# Patient Record
Sex: Male | Born: 1980 | Race: White | Hispanic: No | Marital: Single | Smoking: Former smoker
Health system: Southern US, Community
[De-identification: ages and names within clinical notes are randomized; demographics above are authoritative.]

## PROBLEM LIST (undated history)

## (undated) VITALS — BP 116/78 | HR 112 | Temp 97.3°F | Resp 16 | Ht 70.0 in | Wt 197.0 lb

## (undated) DIAGNOSIS — F32A Depression, unspecified: Secondary | ICD-10-CM

## (undated) DIAGNOSIS — F419 Anxiety disorder, unspecified: Secondary | ICD-10-CM

## (undated) DIAGNOSIS — F329 Major depressive disorder, single episode, unspecified: Secondary | ICD-10-CM

## (undated) DIAGNOSIS — F1020 Alcohol dependence, uncomplicated: Secondary | ICD-10-CM

## (undated) DIAGNOSIS — I10 Essential (primary) hypertension: Secondary | ICD-10-CM

## (undated) DIAGNOSIS — K859 Acute pancreatitis without necrosis or infection, unspecified: Secondary | ICD-10-CM

## (undated) DIAGNOSIS — J45909 Unspecified asthma, uncomplicated: Secondary | ICD-10-CM

## (undated) HISTORY — PX: NO PAST SURGERIES: SHX2092

## (undated) HISTORY — DX: Alcohol dependence, uncomplicated: F10.20

---
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
## 2005-04-11 ENCOUNTER — Other Ambulatory Visit (HOSPITAL_COMMUNITY): Admission: RE | Admit: 2005-04-11 | Discharge: 2005-04-27 | Payer: Self-pay | Admitting: Psychiatry

## 2005-04-19 ENCOUNTER — Ambulatory Visit: Payer: Self-pay | Admitting: Psychiatry

## 2006-01-22 ENCOUNTER — Ambulatory Visit: Payer: Self-pay | Admitting: Psychology

## 2006-09-21 ENCOUNTER — Emergency Department (HOSPITAL_COMMUNITY): Admission: EM | Admit: 2006-09-21 | Discharge: 2006-09-22 | Payer: Self-pay | Admitting: Emergency Medicine

## 2007-12-20 ENCOUNTER — Emergency Department (HOSPITAL_COMMUNITY): Admission: EM | Admit: 2007-12-20 | Discharge: 2007-12-20 | Payer: Self-pay | Admitting: Emergency Medicine

## 2008-07-31 ENCOUNTER — Ambulatory Visit: Payer: Self-pay | Admitting: Internal Medicine

## 2008-07-31 DIAGNOSIS — H6120 Impacted cerumen, unspecified ear: Secondary | ICD-10-CM | POA: Insufficient documentation

## 2008-07-31 DIAGNOSIS — J45909 Unspecified asthma, uncomplicated: Secondary | ICD-10-CM | POA: Insufficient documentation

## 2008-07-31 DIAGNOSIS — F411 Generalized anxiety disorder: Secondary | ICD-10-CM | POA: Insufficient documentation

## 2008-09-11 ENCOUNTER — Ambulatory Visit: Payer: Self-pay | Admitting: Internal Medicine

## 2009-01-14 ENCOUNTER — Ambulatory Visit: Payer: Self-pay | Admitting: Internal Medicine

## 2009-01-14 ENCOUNTER — Encounter: Payer: Self-pay | Admitting: Adult Health

## 2009-01-19 DIAGNOSIS — R079 Chest pain, unspecified: Secondary | ICD-10-CM | POA: Insufficient documentation

## 2009-03-05 ENCOUNTER — Ambulatory Visit: Payer: Self-pay | Admitting: Internal Medicine

## 2009-03-05 ENCOUNTER — Encounter: Payer: Self-pay | Admitting: Adult Health

## 2009-03-05 ENCOUNTER — Telehealth (INDEPENDENT_AMBULATORY_CARE_PROVIDER_SITE_OTHER): Payer: Self-pay | Admitting: *Deleted

## 2009-03-08 ENCOUNTER — Telehealth (INDEPENDENT_AMBULATORY_CARE_PROVIDER_SITE_OTHER): Payer: Self-pay | Admitting: *Deleted

## 2009-03-08 LAB — CONVERTED CEMR LAB
AST: 46 units/L — ABNORMAL HIGH (ref 0–37)
Alkaline Phosphatase: 78 units/L (ref 39–117)
BUN: 9 mg/dL (ref 6–23)
Basophils Relative: 0.8 % (ref 0.0–3.0)
CO2: 28 meq/L (ref 19–32)
Cholesterol: 191 mg/dL (ref 0–200)
Creatinine, Ser: 0.8 mg/dL (ref 0.4–1.5)
Eosinophils Absolute: 0.2 10*3/uL (ref 0.0–0.7)
Eosinophils Relative: 3 % (ref 0.0–5.0)
Glucose, Bld: 82 mg/dL (ref 70–99)
HCT: 41.6 % (ref 39.0–52.0)
Lymphocytes Relative: 23.2 % (ref 12.0–46.0)
Monocytes Absolute: 0.7 10*3/uL (ref 0.1–1.0)
Neutro Abs: 4.5 10*3/uL (ref 1.4–7.7)
Platelets: 222 10*3/uL (ref 150.0–400.0)
Potassium: 3.5 meq/L (ref 3.5–5.1)
RDW: 12.2 % (ref 11.5–14.6)
Sodium: 138 meq/L (ref 135–145)
TSH: 1.28 microintl units/mL (ref 0.35–5.50)
Total Bilirubin: 1.2 mg/dL (ref 0.3–1.2)
VLDL: 9.2 mg/dL (ref 0.0–40.0)

## 2009-08-05 ENCOUNTER — Telehealth (INDEPENDENT_AMBULATORY_CARE_PROVIDER_SITE_OTHER): Payer: Self-pay | Admitting: *Deleted

## 2009-08-05 ENCOUNTER — Encounter: Payer: Self-pay | Admitting: Adult Health

## 2009-08-05 ENCOUNTER — Ambulatory Visit: Payer: Self-pay | Admitting: Internal Medicine

## 2009-08-05 DIAGNOSIS — R109 Unspecified abdominal pain: Secondary | ICD-10-CM | POA: Insufficient documentation

## 2009-08-05 LAB — CONVERTED CEMR LAB
Basophils Relative: 0.7 % (ref 0.0–3.0)
Bilirubin, Direct: 0.5 mg/dL — ABNORMAL HIGH (ref 0.0–0.3)
CO2: 25 meq/L (ref 19–32)
Calcium: 8.9 mg/dL (ref 8.4–10.5)
Chloride: 95 meq/L — ABNORMAL LOW (ref 96–112)
Eosinophils Absolute: 0.3 10*3/uL (ref 0.0–0.7)
Eosinophils Relative: 3.3 % (ref 0.0–5.0)
GFR calc non Af Amer: 113.89 mL/min (ref >60)
Hemoglobin: 15.5 g/dL (ref 13.0–17.0)
Leukocytes, UA: NEGATIVE
Lipase: 47 units/L (ref 11.0–59.0)
Lymphocytes Relative: 20.6 % (ref 12.0–46.0)
MCHC: 35.9 g/dL (ref 30.0–36.0)
MCV: 95.3 fL (ref 78.0–100.0)
Monocytes Absolute: 0.9 10*3/uL (ref 0.1–1.0)
Neutrophils Relative %: 63.2 % (ref 43.0–77.0)
Nitrite: NEGATIVE
Platelets: 178 10*3/uL (ref 150.0–400.0)
Potassium: 3.5 meq/L (ref 3.5–5.1)
Sed Rate: 6 mm/hr (ref 0–22)
Specific Gravity, Urine: >=1.03 (ref 1.000–1.030)
Total Bilirubin: 2.5 mg/dL — ABNORMAL HIGH (ref 0.3–1.2)
Total Protein, Urine: 30 mg/dL
Total Protein: 7.8 g/dL (ref 6.0–8.3)
Urobilinogen, UA: 1 (ref 0.0–1.0)
pH: 5.5 (ref 5.0–8.0)

## 2009-08-06 ENCOUNTER — Encounter: Admission: RE | Admit: 2009-08-06 | Discharge: 2009-08-06 | Payer: Self-pay | Admitting: Pulmonary Disease

## 2009-08-19 ENCOUNTER — Telehealth: Payer: Self-pay | Admitting: Adult Health

## 2009-08-19 ENCOUNTER — Ambulatory Visit (HOSPITAL_COMMUNITY): Admission: RE | Admit: 2009-08-19 | Discharge: 2009-08-19 | Payer: Self-pay | Admitting: Psychiatry

## 2009-08-23 ENCOUNTER — Other Ambulatory Visit (HOSPITAL_COMMUNITY): Admission: RE | Admit: 2009-08-23 | Discharge: 2009-09-02 | Payer: Self-pay | Admitting: Psychiatry

## 2009-09-01 ENCOUNTER — Emergency Department (HOSPITAL_COMMUNITY): Admission: EM | Admit: 2009-09-01 | Discharge: 2009-09-02 | Payer: Self-pay | Admitting: Emergency Medicine

## 2009-12-14 ENCOUNTER — Emergency Department (HOSPITAL_COMMUNITY)
Admission: EM | Admit: 2009-12-14 | Discharge: 2009-12-15 | Payer: Self-pay | Source: Home / Self Care | Admitting: Emergency Medicine

## 2010-02-08 NOTE — Progress Notes (Signed)
Summary: labs  Phone Note Call from Patient Call back at [phone]   Caller: Patient Call For: tammy Summary of Call: calling about l;abs and form to be faxed to ins company Initial call taken by: Lacinda Axon,  March 08, 2009 12:21 PM  Follow-up for Phone Call        form faxed this morning to number provided on form.  LMOVM to inform pt form was faxed and to call back to receive lab results. Boone Master CNA  March 08, 2009 1:00 PM   Additional Follow-up for Phone Call Additional follow up Details #1::        pt called back to talk  with Boone Master but she was in a room with a pt---i spoke with pt and reviewed his labs with him per TP---labs look good---liver test slightly elevated---rec return in 3 months to recheck. pt voiced his understanding and he is also aware that the form has been faxed back to his inusrance company Randell Loop CMA  March 08, 2009 3:31 PM

## 2010-02-08 NOTE — Progress Notes (Signed)
Summary: Pschy Referral   ---- Converted from flag ---- ---- 08/17/2009 3:06 PM, Alfonso Ramus wrote: Sharyn Dross: I called Christiane Ha and gave him Dr. Whitman Hero phone number on 08/05/09. Then touched base with him every other day x 1 week to see if he had scheduled appt. He would tell me that he left message and was waiting on her to call him. Dr. Whitman Hero just called and stated that she and [name] played phone tag for several days. That he would always call after business hours. She stated no appt has been scheduled for him. She said that 1) thank you for the referral and for thinking of her 2) Stated that she has tried to schedule pt but he just will not call her back during office hours to schedule appt.  Thanks, Rhonda ------------------------------ Pt called informed will need ov set up.  Phone Note From Other Clinic   Summary of Call:  called [name] and gave him Dr. Whitman Hero phone number on 08/05/09. Then touched base with him every other day x 1 week to see if he had scheduled appt. He would tell me that he left message and was waiting on her to call him. Dr. Whitman Hero just called and stated that she and [name] played phone tag for several days. That he would always call after business hours. She stated no appt has been scheduled for him. She said that 1) thank you for the referral and for thinking of her 2) Stated that she has tried to schedule pt but he just will not call her back during office hours to schedule appt.  Thanks, Rhonda ------------------------------ Pt called informed will need ov set up.

## 2010-02-08 NOTE — Assessment & Plan Note (Signed)
Summary: Acute NP office visit - CP   CC:  left-sided chest pain that radiates into the left shoulder occasionally x46month - describes as tightness.  denies jaw pain and vision changes..  History of Present Illness: 30 year old male with history of Asthma. 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 Sherene Sires. He wanted to discuss a couple of issure today.  1. Right ear feels like water in it. Went to [facility] recently. NO pain or drainage.  2. He has history of OCD dx at age 89, w/ some anxiety intermittently. Has waxed and waned prev. seen by counselor during childhood, teens. Was treated w/ old drug ?tofranil since age 75 , stopped early this year. Was given hydroxzine. 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  Him and ex-girlfriend have a child age 50 that he has joint custody of. Admits that he drinks to help with anxiety, has cut back on drinking some.  c/o worrying constantly, mind racing, low feelings, tearful, trouble sleeping, wt up and down. If he is busy better but when he is by himself or slow at work, trouble w/ anxiety. Denies chest pain, dyspnea, orthopnea, hemoptysis, fever, n/v/d, edema, headache, suicidal/homicidal ideation.   September 11, 2008--returns for follow up and med refills, Feeling better but still has episodes of anxiety especially when he is not busy. At work he is better. Did stop meds for 1 week, but restarted b/c felt better on meds. no suicidal ideations,Denies chest pain, dyspnea, orthopnea, hemoptysis, fever, n/v/d, edema, headache,recent travel or antibiotic. Does not have insurance but meds were affordable. Unable to go to counseling due to cost. Has looked into county programs in past.   01/14/09--Presents for an acute work in  visit. Complains of left-sided chest pain that radiates into the left shoulder occasionally x14month - describes as tightness.   Seems to happen when he is upset/anxious. Xanax helps/relieves. NO associated dyspnea, diaphrosis, syncope. GERD sx.  No FH of heart disease.    Preventive Screening-Counseling & Management  Alcohol-Tobacco     Smoking Status: never  Medications Prior to Update: 1)  Proair Hfa 108 (90 Base) Mcg/act Aers (Albuterol Sulfate) .... Inhale 2 Puffs Every Four Hours As Needed 2)  Advair Diskus 250-50 Mcg/dose Misc (Fluticasone-Salmeterol) .... Inhale 1 Puff Two Times A Day 3)  Zoloft 100 Mg Tabs (Sertraline Hcl) .Marland Kitchen.. 1 By Mouth Once Daily 4)  Alprazolam 0.25 Mg Tabs (Alprazolam) .... 1/2 -1 By Mouth Two Times A Day As Needed Anxiety  Current Medications (verified): 1)  Proair Hfa 108 (90 Base) Mcg/act Aers (Albuterol Sulfate) .... Inhale 2 Puffs Every Four Hours As Needed 2)  Advair Diskus 250-50 Mcg/dose Misc (Fluticasone-Salmeterol) .... Inhale 1 Puff Two Times A Day 3)  Zoloft 100 Mg Tabs (Sertraline Hcl) .Marland Kitchen.. 1 By Mouth Once Daily 4)  Alprazolam 0.25 Mg Tabs (Alprazolam) .... 1/2 -1 By Mouth Two Times A Day As Needed Anxiety  Allergies (verified): No Known Drug Allergies  Past History:  Family History: Last updated: 01/14/2009 mother - asthma sister - asthma father- asthma neg for CAD   Social History: Last updated: 07/31/2008 never smoked alcohol socially single 1 child lives alone with son work at [employer] and [employer]  Risk Factors: Smoking Status: never (01/14/2009)  Past Medical History: Asthma OCD  Anxiety- started on zoloft 50mg  07/2008, increased 100mg  once daily 09/2008.  Atypical CP 01/14/09 ---EKG nml   Family History: mother - asthma sister - asthma father- asthma neg for CAD   Social History: Smoking Status:  never  Review of Systems      See HPI  Vital Signs:  Patient profile:   30 year old male Height:      71  inches Weight:      204 pounds BMI:     28.56 O2 Sat:      98 % on Room air Temp:     98.6 degrees F oral Pulse rate:   86 / minute BP sitting:   130 / 74  (left arm) Cuff size:   regular  Vitals Entered By: Boone Master CNA (January 14, 2009 5:16 PM)  O2 Flow:  Room air CC: left-sided chest pain that radiates into the left shoulder occasionally x36month - describes as tightness.  denies jaw pain, vision changes. Is Patient Diabetic? No Comments Medications reviewed with patient Daytime contact number verified with patient. Boone Master CNA  January 14, 2009 5:16 PM    Physical Exam  Additional Exam:  GEN: A/Ox3; pleasant , NAD HEENT:  [place]/AT, , EACs-clear, TMs-wnl, NOSE-clear, THROAT-clear NECK:  Supple w/ fair ROM; no JVD; normal carotid impulses w/o bruits; no thyromegaly or nodules palpated; no lymphadenopathy. RESP  Clear to P & A; w/o, wheezes/ rales/ or rhonchi. CARD:  RRR, no m/r/g   GI:   Soft & nt; nml bowel sounds; no organomegaly or masses detected. Musco: Warm bil,  no calf tenderness edema, clubbing, pulses intact Neuro: EOM-wnl, PERRLA, CN 2-12 intact,nml equal grips/streng   EKG nml , no acute changes.    Impression & Recommendations:  Problem # 1:  CHEST PAIN (ICD-786.50)  EKG with no acute changes.  Suspect this is multifactoral w/ undellying anxiety ? muscular in nature.  pt has been instructed to return in 1 month for fasting labs (he will have health insurance at this time) would like to get routine labs.  REC:  stress reducers Warm heat to chest and shoulder area follow up 1 month for routine physical --come fasting.  Please contact office for sooner follow up if symptoms do not improve or worsen  If this worsens or does not improve call back immediately or go to ER.  he is aware if this does not improve to call back or go to ER.   Orders: Est. Patient Level I (29528)  Problem # 2:  ANXIETY STATE, UNSPECIFIED (ICD-300.00) cont on same meds.   cont w/ couseling.  His updated medication list for this problem includes:    Zoloft 100 Mg Tabs (Sertraline hcl) .Marland Kitchen... 1 by mouth once daily    Alprazolam 0.25 Mg Tabs (Alprazolam) .Marland Kitchen... 1/2 -1 by mouth two times a day as needed anxiety  Complete Medication List: 1)  Proair Hfa 108 (90 Base) Mcg/act Aers (Albuterol sulfate) .... Inhale 2 puffs every four hours as needed 2)  Advair Diskus 250-50 Mcg/dose Misc (Fluticasone-salmeterol) .... Inhale 1 puff two times a day 3)  Zoloft 100 Mg Tabs (Sertraline hcl) .Marland Kitchen.. 1 by mouth once daily 4)  Alprazolam 0.25 Mg Tabs (Alprazolam) .... 1/2 -1 by mouth two times a day as needed anxiety  Patient Instructions: 1)  Warm heat to chest and shoulder area 2)  follow up 1 month for routine physical --come fasting.  3)  Please contact  office for sooner follow up if symptoms do not improve or worsen  4)  If this worsens or does not improve call back immediately or go to ER.    Immunization History:  Influenza Immunization History:    Influenza:  historical (10/10/2007)

## 2010-02-08 NOTE — Letter (Signed)
Summary: [facility]   Imported By: Lester Schleicher 03/11/2009 11:00:01  _____________________________________________________________________  External Attachment:    Type:   Image     Comment:   External Document

## 2010-02-08 NOTE — Assessment & Plan Note (Signed)
Summary: health screening///JJ   CC:  PT HAS NEW INSURANCE AND NEEDS HEALTH SCREENING DONE TODAY.  History of Present Illness: 30 year old male with history of Asthma. 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 Sherene Sires. He wanted to discuss a couple of issure today.  1. Right ear feels like water in it. Went to [facility] recently. NO pain or drainage.  2. He has history of OCD dx at age 40, w/ some anxiety intermittently. Has waxed and waned prev. seen by counselor during childhood, teens. Was treated w/ old drug ?tofranil since age 47 , stopped early this year. Was given hydroxzine. 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  Him and ex-girlfriend have a child age 45 that he has joint custody of. Admits that he drinks to help with anxiety, has cut back on drinking some.  c/o worrying constantly, mind racing, low feelings, tearful, trouble sleeping, wt up and down. If he is busy better but when he is by himself or slow at work, trouble w/ anxiety .   September 11, 2008--returns for follow up and med refills, Feeling better but still has episodes of anxiety especially when he is not busy. At work he is better. Did stop meds for 1 week, but restarted b/c felt better on meds. no suicidal ideations, . Does not have insurance but meds were affordable. Unable to go to counseling due to cost. Has looked into county programs in past.   01/14/09--Presents for an acute work in visit. Complains of left-sided chest pain that radiates into the left shoulder occasionally x30month - describes as tightness.   Seems to happen when he is upset/anxious. Xanax helps/relieves. NO associated dyspnea, diaphrosis, syncope. GERD sx.  No FH of heart disease.    03/05/09--Presents for health  physical for work. Started new job-full time status and needs health form completed w/ fasting labs. He is feeling good. No further episodes of left side chest pain. Zoloft working good. uses xanax occasionally to help w/ panic attacks. They are less. Denies chest pain, dyspnea, orthopnea, hemoptysis, fever, n/v/d, edema, headache.   Medications Prior to Update: 1)  Proair Hfa 108 (90 Base) Mcg/act Aers (Albuterol Sulfate) .... Inhale 2 Puffs Every Four Hours As Needed 2)  Advair Diskus 250-50 Mcg/dose Misc (Fluticasone-Salmeterol) .... Inhale 1 Puff Two Times A Day 3)  Zoloft 100 Mg Tabs (Sertraline Hcl) .Marland Kitchen.. 1 By Mouth Once Daily 4)  Alprazolam 0.25 Mg Tabs (Alprazolam) .... 1/2 -1 By Mouth Two Times A Day As Needed Anxiety  Current Medications (verified): 1)  Proair Hfa 108 (90 Base) Mcg/act Aers (Albuterol Sulfate) .... Inhale 2 Puffs Every Four Hours As Needed 2)  Advair Diskus 250-50 Mcg/dose Misc (Fluticasone-Salmeterol) .... Inhale 1 Puff Two Times A Day 3)  Zoloft 100 Mg Tabs (Sertraline Hcl) .Marland Kitchen.. 1 By Mouth Once Daily 4)  Alprazolam 0.25 Mg Tabs (Alprazolam) .... 1/2 -1 By Mouth Two Times A Day As Needed Anxiety  Allergies (verified): No Known Drug Allergies  Comments:  Nurse/Medical Assistant: The patient's medications and allergies were reviewed with the patient and were updated in the Medication and Allergy Lists.  Past History:  Past Surgical History: Last updated: 07/31/2008 none  Family History: Last updated: 01/14/2009 mother - asthma sister -  asthma father- asthma neg for CAD   Social History: Last updated: 07/31/2008 never smoked alcohol socially single 1 child lives alone with son work at [employer] and [employer]  Risk Factors: Smoking Status: never (01/14/2009)  Past Medical History: Asthma OCD  Anxiety- started on zoloft 50mg  07/2008, increased 100mg  once daily 09/2008.  Atypical CP 01/14/09 ---EKG nml   Health Maintence -TDAP 03/05/09  Review  of Systems      See HPI  Vital Signs:  Patient profile:   30 year old male Height:      71 inches Weight:      200.38 pounds BMI:     28.05 O2 Sat:      97 % on Room air Temp:     97.9 degrees F oral Pulse rate:   82 / minute BP sitting:   144 / 96  (right arm) Cuff size:   regular  Vitals Entered By: Randell Loop CMA (March 05, 2009 2:53 PM)  O2 Sat at Rest %:  97 O2 Flow:  Room air CC: PT HAS NEW INSURANCE AND NEEDS HEALTH SCREENING DONE TODAY Is Patient Diabetic? No Pain Assessment Patient in pain? no      Comments NO CHANGES IN MEDS TODAY   Physical Exam  Additional Exam:  GEN: A/Ox3; pleasant , NAD HEENT:  [place]/AT, , EACs-clear, TMs-wnl, NOSE-clear, THROAT-clear NECK:  Supple w/ fair ROM; no JVD; normal carotid impulses w/o bruits; no thyromegaly or nodules palpated; no lymphadenopathy. RESP  Clear to P & A; w/o, wheezes/ rales/ or rhonchi. CARD:  RRR, no m/r/g   GI:   Soft & nt; nml bowel sounds; no organomegaly or masses detected. Musco: Warm bil,  no calf tenderness edema, clubbing, pulses intact Neuro: EOM-wnl, PERRLA, CN 2-12 intact,nml equal grips/streng       Impression & Recommendations:  Problem # 1:  PHYSICAL EXAMINATION (ICD-V70.0)  Health form completey fasting labs pending encourgaed on healthy diet, exercise.   Orders: TLB-CBC Platelet - w/Differential (85025-CBCD) TLB-BMP (Basic Metabolic Panel-BMET) (80048-METABOL) TLB-Hepatic/Liver Function Pnl (80076-HEPATIC) TLB-TSH (Thyroid Stimulating Hormone) (84443-TSH) TLB-Lipid Panel (80061-LIPID) Est. Patient 18-39 years (62130)  Problem # 2:  ANXIETY STATE, UNSPECIFIED (ICD-300.00) Improved on meds   His updated medication list for this problem includes:    Zoloft 100 Mg Tabs (Sertraline hcl) .Marland Kitchen... 1 by mouth once daily    Alprazolam 0.25 Mg Tabs (Alprazolam) .Marland Kitchen... 1/2 -1 by mouth two times a day as needed anxiety  Complete Medication List: 1)  Proair Hfa 108 (90 Base) Mcg/act Aers  (Albuterol sulfate) .... Inhale 2 puffs every four hours as needed 2)  Advair Diskus 250-50 Mcg/dose Misc (Fluticasone-salmeterol) .... Inhale 1 puff two times a day 3)  Zoloft 100 Mg Tabs (Sertraline hcl) .Marland Kitchen.. 1 by mouth once daily 4)  Alprazolam 0.25 Mg Tabs (Alprazolam) .... 1/2 -1 by mouth two times a day as needed anxiety  Other Orders: Tdap => 38yrs IM (86578) Admin 1st Vaccine (46962) Admin 1st Vaccine [facility]) [phone])  Patient Instructions: 1)  Continue on same meds 2)  TDAP booster today  3)  Diet and exercise 4)  follow up Dr. Sherene Sires in 6 months.  5)  [id number]    Tetanus/Td Vaccine    Vaccine Type: Tdap    Site: left deltoid    Mfr: boostrix    Dose: 0.5 ml    Route: IM    Given by: Randell Loop CMA    Exp. Date: 03/06/2011    Lot #: [id number]  VIS given: 11/27/06 version given March 05, 2009.

## 2010-02-08 NOTE — Progress Notes (Signed)
Summary: request to be seen  Phone Note Call from Patient   Caller: Patient Call For: tammy parrett Summary of Call: pt wants to see tp today re: difficulty eating and sleeping for the last 4 day. i gave him the avail slot at 4:30 but pt would like to be seen sooner. [phone] Initial call taken by: Tivis Ringer, CNA,  August 05, 2009 11:02 AM  Follow-up for Phone Call        Spoke with pt and sched appt for 2:45 pm today with TP. Follow-up by: Vernie Murders,  August 05, 2009 11:11 AM

## 2010-02-08 NOTE — Assessment & Plan Note (Signed)
Summary: Acute NP office visit    CC:  loss of appetite and lower abd cramping and difficulty falling/staying asleep x1week.  History of Present Illness: 30 year old male with history of Asthma. 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 Sherene Sires. He wanted to discuss a couple of issure today.  1. Right ear feels like water in it. Went to [employer] recently. NO pain or drainage.  2. He has history of OCD dx at age 44, w/ some anxiety intermittently. Has waxed and waned prev. seen by counselor during childhood, teens. Was treated w/ old drug ?tofranil since age 56 , stopped early this year. Was given hydroxzine. 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  Him and ex-girlfriend have a child age 43 that he has joint custody of. Admits that he drinks to help with anxiety, has cut back on drinking some.  c/o worrying constantly, mind racing, low feelings, tearful, trouble sleeping, wt up and down. If he is busy better but when he is by himself or slow at work, trouble w/ anxiety .   September 11, 2008--returns for follow up and med refills, Feeling better but still has episodes of anxiety especially when he is not busy. At work he is better. Did stop meds for 1 week, but restarted b/c felt better on meds. no suicidal ideations, . Does not have insurance but meds were affordable. Unable to go to counseling due to cost. Has looked into county programs in past.   01/14/09--Presents for an acute work in visit. Complains of left-sided chest pain that radiates into the left shoulder occasionally x9month - describes as tightness.   Seems to happen when he is upset/anxious. Xanax helps/relieves. NO associated dyspnea, diaphrosis, syncope. GERD sx.  No FH of heart disease.     03/05/09--Presents for health physical for work. Started new job-full time status and needs health form completed w/ fasting labs. He is feeling good. No further episodes of left side chest pain. Zoloft working good. uses xanax occasionally to help w/ panic attacks. They are less. Denies chest pain, dyspnea, orthopnea, hemoptysis, fever, n/v/d, edema, headache.   August 05, 2009--Presents for an acute office visit. Complains of loss of appetite, lower abd cramping and difficulty falling/staying asleep x1week. Last seen was doing better w/ zoloft. He admits today he stopped this 2 months ago when he started drinking alcohol again. He has admitted he has a problem with alcohol. He has agreed to referral for counseling. He wants to restart on zoloft to help with mood, anxiety . He is very tearful during exam today. Last alcohol was 1 week ago. He denies suicidal/homicidal ideations. Denies chest pain, dyspnea, orthopnea, hemoptysis, fever, n/v/d, edema, headache,recent travel or antibiotics.    Medications Prior to Update: 1)  Ventolin Hfa 108 (90 Base) Mcg/act Aers (Albuterol Sulfate) .... Inhale 2 Puffs Every Four Hours As Needed 2)  Advair Diskus 250-50 Mcg/dose Misc (Fluticasone-Salmeterol) .... Inhale 1 Puff Two Times A Day 3)  Zoloft 100 Mg Tabs (Sertraline Hcl) .Marland Kitchen.. 1 By Mouth Once Daily 4)  Alprazolam 0.25 Mg Tabs (Alprazolam) .... 1/2 -1 By Mouth Two Times A Day As Needed Anxiety  Current Medications (verified): 1)  Ventolin Hfa 108 (90 Base) Mcg/act Aers (Albuterol Sulfate) .... Inhale  2 Puffs Every Four Hours As Needed 2)  Advair Diskus 250-50 Mcg/dose Misc (Fluticasone-Salmeterol) .... Inhale 1 Puff Two Times A Day 3)  Zoloft 100 Mg Tabs (Sertraline Hcl) .Marland Kitchen.. 1 By Mouth Once Daily 4)  Alprazolam 0.25 Mg Tabs (Alprazolam) .... 1/2 -1 By Mouth Two Times A Day As Needed Anxiety  Allergies (verified): No Known Drug Allergies  Past History:  Past Medical History: Last updated:  03/05/2009 Asthma OCD  Anxiety- started on zoloft 50mg  07/2008, increased 100mg  once daily 09/2008.  Atypical CP 01/14/09 ---EKG nml   Health Maintence -TDAP 03/05/09  Past Surgical History: Last updated: 07/31/2008 none  Family History: Last updated: 01/14/2009 mother - asthma sister - asthma father- asthma neg for CAD   Social History: Last updated: 07/31/2008 never smoked alcohol socially single 1 child lives alone with son work at [employer] and [employer]  Risk Factors: Smoking Status: never (01/14/2009)  Review of Systems      See HPI  Vital Signs:  Patient profile:   30 year old male Height:      71 inches Weight:      195.25 pounds BMI:     27.33 O2 Sat:      99 % on Room air Temp:     98.3 degrees F oral Pulse rate:   102 / minute BP sitting:   126 / 90  (right arm) Cuff size:   regular  Vitals Entered By: Ian Master CNA/MA (August 05, 2009 2:49 PM)  O2 Flow:  Room air  Physical Exam  Additional Exam:  GEN: A/Ox3; pleasant , NAD HEENT:  [place]/AT, , EACs-clear, TMs-wnl, NOSE-clear, THROAT-clear NECK:  Supple w/ fair ROM; no JVD; normal carotid impulses w/o bruits; no thyromegaly or nodules palpated; no lymphadenopathy. RESP  Clear to P & A; w/o, wheezes/ rales/ or rhonchi. CARD:  RRR, no m/r/g   GI:   Soft & nt; nml bowel sounds; no organomegaly or masses detected. [place]: Warm bil,  no calf tenderness edema, clubbing, pulses intact Neuro: EOM-wnl, PERRLA, CN 2-12 intact,nml equal grips/streng       Impression & Recommendations:  Problem # 1:  ANXIETY STATE, UNSPECIFIED (ICD-300.00) Complicated by  alcohol abuse , OCD , depression.  REC  Restart Zoloft 100mg  1/2 by mouth once daily for 2 weeks then 1 by mouth once daily .  May use xanax 0.25mg  1/2 -1 by mouth once daily as needed anxiety We are referring you to a counselor.  Please contact office for sooner follow up if symptoms do not improve or worsen  His updated medication list for this  problem includes:    Zoloft 100 Mg Tabs (Sertraline hcl) .Marland Kitchen... 1 by mouth once daily    Alprazolam 0.25 Mg Tabs (Alprazolam) .Marland Kitchen... 1/2 -1 by mouth two times a day as needed anxiety  Orders: Psychology Referral (Psychology) Est. Patient Level IV (24401)  Problem # 2:  ABDOMINAL PAIN OTHER SPECIFIED SITE (ICD-789.09) suspect GERD vs IBS  Do not skip meals, eat small frequent meals.  GasX w/ meals.  Dexilant 60mg  once daily for 10 days, then prilosec 20mg  once daily as needed heartburn.  NO alcohol.  follow up 6 weeks.  Orders: T-Urine Culture (Spectrum Order) [id number]) TLB-Hepatic/Liver Function Pnl (80076-HEPATIC) TLB-CBC Platelet - w/Differential (85025-CBCD) TLB-BMP (Basic Metabolic Panel-BMET) (80048-METABOL) TLB-Sedimentation Rate (ESR) (85652-ESR) TLB-Amylase (82150-AMYL) TLB-Udip w/ Micro (81001-URINE) TLB-Lipase (83690-LIPASE) Radiology Referral (Radiology) Est. Patient Level IV (03474)  Complete Medication List: 1)  Ventolin Hfa 108 (90 Base) Mcg/act Aers (Albuterol  sulfate) .... Inhale 2 puffs every four hours as needed 2)  Advair Diskus 250-50 Mcg/dose Misc (Fluticasone-salmeterol) .... Inhale 1 puff two times a day 3)  Zoloft 100 Mg Tabs (Sertraline hcl) .Marland Kitchen.. 1 by mouth once daily 4)  Alprazolam 0.25 Mg Tabs (Alprazolam) .... 1/2 -1 by mouth two times a day as needed anxiety  Patient Instructions: 1)  Restart Zoloft 100mg  1/2 by mouth once daily for 2 weeks then 1 by mouth once daily .  2)  May use xanax 0.25mg  1/2 -1 by mouth once daily as needed anxiety 3)  I will call with labs results.  4)  Do not skip meals, eat small frequent meals.  5)  GasX w/ meals.  6)  Dexilant 60mg  once daily for 10 days, then prilosec 20mg  once daily as needed heartburn.  7)  NO alcohol.  8)  follow up 6 weeks.  9)  We are referring you to a counselor.  10)  Please contact office for sooner follow up if symptoms do not improve or worsen   Prevention & Chronic  Care Immunizations   Influenza vaccine: Historical  (10/10/2007)    Tetanus booster: 03/05/2009: Tdap    Pneumococcal vaccine: Not documented  Other Screening   Smoking status: never  (01/14/2009)

## 2010-02-08 NOTE — Progress Notes (Signed)
Summary: form/ ins/ health screening  Phone Note Call from Patient Call back at [phone]   Caller: Patient Call For: tammy parrett Summary of Call: pt needs a form filled out re: ins/ health screening. thinks he should see tp today or just bring in paper? wants to speak to [place]. [phone] Initial call taken by: Tivis Ringer, CNA,  March 05, 2009 10:29 AM  Follow-up for Phone Call        called spoke with patient, he states that he has recently changed insurances and received an email today stating that he needs a "health screening" by Monday.  offered appt w/ TP monday @ 0900.  pt declined this and requested an appt today.  per TP, okay to come in now.  appt made in 1615 slot, pt coming now. Boone Master CNA  March 05, 2009 2:30 PM

## 2010-03-22 LAB — BASIC METABOLIC PANEL
BUN: 15 mg/dL (ref 6–23)
CO2: 23 mEq/L (ref 19–32)
GFR calc Af Amer: >60 mL/min (ref >60)
GFR calc non Af Amer: >60 mL/min (ref >60)
Glucose, Bld: 117 mg/dL — ABNORMAL HIGH (ref 70–99)
Sodium: 139 mEq/L (ref 135–145)

## 2010-03-22 LAB — DIFFERENTIAL
Basophils Absolute: 0.1 10*3/uL (ref 0.0–0.1)
Eosinophils Relative: 1 % (ref 0–5)
Lymphs Abs: 2.9 10*3/uL (ref 0.7–4.0)
Monocytes Absolute: 0.8 10*3/uL (ref 0.1–1.0)
Monocytes Relative: 10 % (ref 3–12)

## 2010-03-22 LAB — CBC
HCT: 46.8 % (ref 39.0–52.0)
MCV: 86.3 fL (ref 78.0–100.0)
RBC: 5.42 MIL/uL (ref 4.22–5.81)
RDW: 13.1 % (ref 11.5–15.5)

## 2010-03-22 LAB — RAPID URINE DRUG SCREEN, HOSP PERFORMED
Barbiturates: NOT DETECTED
Tetrahydrocannabinol: NOT DETECTED

## 2010-03-22 LAB — ETHANOL: Alcohol, Ethyl (B): 152 mg/dL — ABNORMAL HIGH (ref 0–10)

## 2010-03-22 LAB — TRICYCLICS SCREEN, URINE: TCA Scrn: NOT DETECTED

## 2010-03-25 LAB — DIFFERENTIAL
Basophils Relative: 0 % (ref 0–1)
Lymphocytes Relative: 12 % (ref 12–46)
Lymphs Abs: 1.2 10*3/uL (ref 0.7–4.0)
Monocytes Absolute: 0.7 10*3/uL (ref 0.1–1.0)
Monocytes Relative: 8 % (ref 3–12)
Neutrophils Relative %: 80 % — ABNORMAL HIGH (ref 43–77)

## 2010-03-25 LAB — URINALYSIS, ROUTINE W REFLEX MICROSCOPIC
Ketones, ur: >80 mg/dL — AB
Leukocytes, UA: NEGATIVE
Nitrite: NEGATIVE
Specific Gravity, Urine: 1.031 — ABNORMAL HIGH (ref 1.005–1.030)
pH: 6 (ref 5.0–8.0)

## 2010-03-25 LAB — BASIC METABOLIC PANEL
BUN: 10 mg/dL (ref 6–23)
GFR calc Af Amer: >60 mL/min (ref >60)
GFR calc non Af Amer: >60 mL/min (ref >60)
Glucose, Bld: 115 mg/dL — ABNORMAL HIGH (ref 70–99)

## 2010-03-25 LAB — CBC
MCH: 33.5 pg (ref 26.0–34.0)
MCHC: 34.7 g/dL (ref 30.0–36.0)
MCV: 96.5 fL (ref 78.0–100.0)
Platelets: 158 10*3/uL (ref 150–400)

## 2010-03-25 LAB — URINE CULTURE
Colony Count: NO GROWTH
Culture  Setup Time: 201108250454
Culture: NO GROWTH

## 2010-03-25 LAB — POCT I-STAT, CHEM 8
BUN: 9 mg/dL (ref 6–23)
Chloride: 98 mEq/L (ref 96–112)
Glucose, Bld: 119 mg/dL — ABNORMAL HIGH (ref 70–99)
Potassium: 3.4 mEq/L — ABNORMAL LOW (ref 3.5–5.1)

## 2010-03-25 LAB — URINE MICROSCOPIC-ADD ON

## 2010-04-12 ENCOUNTER — Emergency Department (HOSPITAL_COMMUNITY)
Admission: EM | Admit: 2010-04-12 | Discharge: 2010-04-13 | Disposition: A | Discharge status: Other Acute Inpatient Hospital | Payer: BC Managed Care – PPO | Attending: Emergency Medicine | Admitting: Emergency Medicine

## 2010-04-12 DIAGNOSIS — F1020 Alcohol dependence, uncomplicated: Secondary | ICD-10-CM | POA: Insufficient documentation

## 2010-04-12 DIAGNOSIS — Z046 Encounter for general psychiatric examination, requested by authority: Secondary | ICD-10-CM | POA: Insufficient documentation

## 2010-04-12 DIAGNOSIS — R000 Tachycardia, unspecified: Secondary | ICD-10-CM | POA: Insufficient documentation

## 2010-04-12 DIAGNOSIS — R748 Abnormal levels of other serum enzymes: Secondary | ICD-10-CM | POA: Insufficient documentation

## 2010-04-12 DIAGNOSIS — R079 Chest pain, unspecified: Secondary | ICD-10-CM | POA: Insufficient documentation

## 2010-04-12 LAB — DIFFERENTIAL
Eosinophils Absolute: 0 10*3/uL (ref 0.0–0.7)
Lymphs Abs: 2.4 10*3/uL (ref 0.7–4.0)
Neutrophils Relative %: 51 % (ref 43–77)

## 2010-04-12 LAB — RAPID URINE DRUG SCREEN, HOSP PERFORMED
Amphetamines: NOT DETECTED
Barbiturates: NOT DETECTED

## 2010-04-12 LAB — CBC
HCT: 49.2 % (ref 39.0–52.0)
MCH: 33.2 pg (ref 26.0–34.0)
MCV: 95.5 fL (ref 78.0–100.0)
RDW: 12.5 % (ref 11.5–15.5)

## 2010-04-13 ENCOUNTER — Inpatient Hospital Stay (HOSPITAL_COMMUNITY)
Admission: RE | Admit: 2010-04-13 | Discharge: 2010-04-17 | DRG: 897 | Disposition: A | Discharge status: Home / Self Care | Payer: BC Managed Care – PPO | Source: Ambulatory Visit | Attending: Psychiatry | Admitting: Psychiatry

## 2010-04-13 DIAGNOSIS — F1020 Alcohol dependence, uncomplicated: Principal | ICD-10-CM

## 2010-04-13 DIAGNOSIS — F4320 Adjustment disorder, unspecified: Secondary | ICD-10-CM

## 2010-04-13 DIAGNOSIS — J45909 Unspecified asthma, uncomplicated: Secondary | ICD-10-CM

## 2010-04-13 LAB — COMPREHENSIVE METABOLIC PANEL
ALT: 137 U/L — ABNORMAL HIGH (ref 0–53)
AST: 267 U/L — ABNORMAL HIGH (ref 0–37)
Albumin: 4.1 g/dL (ref 3.5–5.2)
BUN: 16 mg/dL (ref 6–23)
CO2: 20 mEq/L (ref 19–32)
Chloride: 99 mEq/L (ref 96–112)
Creatinine, Ser: 0.79 mg/dL (ref 0.4–1.5)
GFR calc non Af Amer: >60 mL/min (ref >60)
Potassium: 3.7 mEq/L (ref 3.5–5.1)
Sodium: 136 mEq/L (ref 135–145)
Total Bilirubin: 1.2 mg/dL (ref 0.3–1.2)
Total Protein: 7.5 g/dL (ref 6.0–8.3)

## 2010-04-13 LAB — ETHANOL
Alcohol, Ethyl (B): 425 mg/dL (ref 0–10)
Alcohol, Ethyl (B): 271 mg/dL — ABNORMAL HIGH (ref 0–10)

## 2010-04-14 DIAGNOSIS — F1020 Alcohol dependence, uncomplicated: Secondary | ICD-10-CM

## 2010-04-14 LAB — HEPATIC FUNCTION PANEL
Albumin: 3.7 g/dL (ref 3.5–5.2)
Alkaline Phosphatase: 81 U/L (ref 39–117)
Indirect Bilirubin: 1.8 mg/dL — ABNORMAL HIGH (ref 0.3–0.9)
Total Bilirubin: 2.5 mg/dL — ABNORMAL HIGH (ref 0.3–1.2)

## 2010-04-16 LAB — HEPATITIS PANEL, ACUTE
Hep B C IgM: NEGATIVE
Hepatitis B Surface Ag: NEGATIVE

## 2010-04-18 NOTE — H&P (Signed)
NAME:  Ian [name], FILLER            ACCOUNT NO.:  [id number]  MEDICAL RECORD NO.:  [id number]           PATIENT TYPE:  I  LOCATION:  0306                          [facility]ILITY[facility]  [facility]  PHYSICIAN:  Anselm Jungling, MD  DATE OF BIRTH:  [birth year]  DATE OF ADMISSION:  04/13/2010 DATE OF DISCHARGE:                      PSYCHIATRIC ADMISSION ASSESSMENT   HISTORY OF PRESENT ILLNESS:  The patient presents with a history of alcohol dependence.  His alcohol has been escalating.  He relapsed in January after being sober for approximately 4 months, due to increasing stressors.  He states he has been drinking around the clock, drinking at night and drinks more depending how he feels, drinking up to half a gallon of vodka at a time.  His last drink was on Tuesday.  He denies any depression or suicidal thoughts.  He does report problems with anxiety.  Denies any hallucinations.  PSYCHIATRIC HISTORY:  First admission to the [facility] was at [facility].  SOCIAL HISTORY:  The patient is a single male.  He works at [employer]  Has no legal problems.  FAMILY HISTORY:  None.  ALCOHOL AND DRUG HISTORY:  He denies any seizures or blackouts.  Denies any other substance use.  PRIMARY CARE PROVIDER:  Dr. Rubye Oaks.  MEDICAL PROBLEMS:  History of asthma and generalized abdominal pain.  MEDICATIONS: 1. Zoloft 100 mg for approximately 2 months and does miss occasional     doses at times due to when he is drinking. 2. Albuterol. 3. Advair.  DRUG ALLERGIES:  NO KNOWN ALLERGIES.  PHYSICAL EXAMINATION:  GENERAL: This is a well-nourished, normally developed male in no acute distress.  He does complain of some generalized abdominal pain. LABORATORY DATA:  His alcohol level on admission was 425.  SGOT was elevated at 267 and SGPT 137.  Alcohol level down to 271.  Urine drug screen is negative.  Platelet count is normal. PHYSICAL EXAM:  Reviewed. HEENT:  The patient was  noted to have poor dentition. HEART:  Tachycardiac. ABDOMEN:  Had some mild epigastric tenderness. MENTAL STATUS EXAM:  He is fully alert and cooperative, with fair eye contact.  Casually dressed and neat in appearance.  Speech is clear and polite, normal pace and tone.  The patient's mood is neutral.  Affect: Again, he is pleasant and wanting help.  Thought processes are coherent and goal directed.  No evidence of any psychotic symptoms.  Cognitive function intact.  Memory is intact.  Judgment and insight appear to be good.  DIAGNOSES:  AXIS I:  Alcohol dependence. AXIS II:  Deferred. AXIS III:  A history of asthma. AXIS IV:  Deferred. AXIS V:  Current is 40.  PLAN:  Place the patient on Librium protocol.  Will have Vistaril for sleep.  Will continue to assess further comorbidities.  The patient is to attend groups.  Identify his support group and assess his motivation for rehab.  Length of stay 3-5 days.     Landry Corporal, N.P.   ______________________________ Anselm Jungling, MD    JO/MEDQ  D:  04/14/2010  T:  04/14/2010  Job:  [id number]  Electronically Signed  by Limmie PatriciaP. on 04/14/2010 03:56:59 PM Electronically Signed by Geralyn Flash MD on 04/18/2010 11:30:06 AM

## 2010-05-11 NOTE — Discharge Summary (Signed)
  NAME:  Ian [name], Ian [name]            ACCOUNT NO.:  [id number]  MEDICAL RECORD NO.:  [id number]           PATIENT TYPE:  I  LOCATION:  0306                          [facility]ACILITY:  [facility]  PHYSICIAN:  Yakelin Grenier T. Hansford Hirt, M.D.   DATE OF BIRTH:  1980-02-16  DATE OF ADMISSION:  04/13/2010 DATE OF DISCHARGE:                              DISCHARGE SUMMARY   HOSPITAL COURSE:  Patient presented to the emergency department requesting detoxification as well as a rehab.  [facility] and [facility] for 3 months, although there is some question about if that was the actual length of time.  He is requesting discharge.  He was seen in conjunction with Dr. Lenna Sciara.  He feels that he has benefited from being on the low-dose Librium protocol.  He does not have any active withdrawal symptoms at this time and he is anxious to resume his employment.  DISCHARGE MEDICATIONS:  Pepcid 20 mg p.o. q day, hydroxyzine - he can take 25 mg q.6 hours p.r.n. and 50 mg at bedtime p.r.n.,  thiamine 100 mg p.o. q day, Advair Diskus 1 puff b.i.d., Advil 200 mg q. 8 hours p.r.n., albuterol inhaler 2 puffs q. 4 hours p.r.n..  He can resume his home medication of Zoloft 100 mg p.o. q day.  FOLLOWUP:  His follow-up plan is with [facility]; he is a patient there.  We were unable to actually schedule an appointment yet, but that will be accomplished on Monday.  DIAGNOSES:  AXIS I:  Alcohol dependence. AXIS II:  Deferred. AXIS III:  Asthma. AXIS IV:  Chronic alcoholism. AXIS V:  60.     Mickie Deery Adams, P.A.-C.   ______________________________ Phillips Grout. Lolly Mustache, M.D.    MD/MEDQ  D:  04/17/2010  T:  04/17/2010  Job:  [id number]  Electronically Signed by Jaci Lazier ADAMS P.A.-C. on 05/07/2010 03:31:01 PM Electronically Signed by Kathryne Sharper M.D. on 05/11/2010 10:21:55 AM

## 2010-05-31 ENCOUNTER — Other Ambulatory Visit: Payer: Self-pay | Admitting: Adult Health

## 2010-10-21 LAB — URINALYSIS, ROUTINE W REFLEX MICROSCOPIC
Hgb urine dipstick: NEGATIVE
Specific Gravity, Urine: 1.008
Urobilinogen, UA: 0.2
pH: 6

## 2010-10-21 LAB — DIFFERENTIAL
Basophils Absolute: 0
Basophils Relative: 1
Eosinophils Absolute: 0.2
Eosinophils Relative: 3
Lymphs Abs: 3.5 — ABNORMAL HIGH
Neutrophils Relative %: 47

## 2010-10-21 LAB — CBC
HCT: 46.1
MCHC: 35.3
MCV: 91.6
Platelets: 298
RDW: 12.7

## 2010-10-21 LAB — BASIC METABOLIC PANEL
BUN: 7
CO2: 26
Chloride: 107
Creatinine, Ser: 1.02
Glucose, Bld: 107 — ABNORMAL HIGH
Potassium: 3.6

## 2010-10-21 LAB — RAPID URINE DRUG SCREEN, HOSP PERFORMED
Amphetamines: NOT DETECTED
Barbiturates: NOT DETECTED

## 2010-10-21 LAB — ETHANOL: Alcohol, Ethyl (B): 202 — ABNORMAL HIGH

## 2010-11-25 ENCOUNTER — Other Ambulatory Visit: Payer: Self-pay | Admitting: Adult Health

## 2011-01-11 ENCOUNTER — Telehealth: Payer: Self-pay | Admitting: Pulmonary Disease

## 2011-01-11 NOTE — Telephone Encounter (Signed)
I spoke with pt and advised him before we can swen refill that he needs OV with MW per last refill request bc he has not last seen Dr. Sherene Sires. Last time pt saw TP was on 08/05/09 and told to f/u 6 weeks w/ MW. NO pending apt. Pt states he will have to call back to scheduled the apt. Will await pt call back

## 2011-04-26 ENCOUNTER — Encounter (HOSPITAL_COMMUNITY): Payer: Self-pay | Admitting: Emergency Medicine

## 2011-04-26 ENCOUNTER — Emergency Department (HOSPITAL_COMMUNITY)
Admission: EM | Admit: 2011-04-26 | Discharge: 2011-04-27 | Disposition: A | Discharge status: Home / Self Care | Payer: Self-pay | Attending: Emergency Medicine | Admitting: Emergency Medicine

## 2011-04-26 DIAGNOSIS — F1010 Alcohol abuse, uncomplicated: Secondary | ICD-10-CM | POA: Insufficient documentation

## 2011-04-26 DIAGNOSIS — F341 Dysthymic disorder: Secondary | ICD-10-CM | POA: Insufficient documentation

## 2011-04-26 HISTORY — DX: Anxiety disorder, unspecified: F41.9

## 2011-04-26 HISTORY — DX: Major depressive disorder, single episode, unspecified: F32.9

## 2011-04-26 HISTORY — DX: Depression, unspecified: F32.A

## 2011-04-26 LAB — CBC
HCT: 45 % (ref 39.0–52.0)
Platelets: 358 10*3/uL (ref 150–400)
RBC: 4.61 MIL/uL (ref 4.22–5.81)
RDW: 13.3 % (ref 11.5–15.5)
WBC: 6.8 10*3/uL (ref 4.0–10.5)

## 2011-04-26 LAB — URINALYSIS, ROUTINE W REFLEX MICROSCOPIC
Bilirubin Urine: NEGATIVE
Glucose, UA: NEGATIVE mg/dL
Hgb urine dipstick: NEGATIVE
Ketones, ur: NEGATIVE mg/dL
pH: 5.5 (ref 5.0–8.0)

## 2011-04-26 LAB — RAPID URINE DRUG SCREEN, HOSP PERFORMED
Amphetamines: NOT DETECTED
Benzodiazepines: NOT DETECTED
Opiates: NOT DETECTED

## 2011-04-26 LAB — BASIC METABOLIC PANEL
BUN: 11 mg/dL (ref 6–23)
Chloride: 105 mEq/L (ref 96–112)
GFR calc Af Amer: >90 mL/min (ref >90)
Potassium: 3.7 mEq/L (ref 3.5–5.1)

## 2011-04-26 MED ORDER — THIAMINE HCL 100 MG/ML IJ SOLN: 100.0000 mg | Freq: Every day | INTRAMUSCULAR | Status: DC

## 2011-04-26 MED ORDER — LORAZEPAM 1 MG PO TABS
1.0000 mg | ORAL_TABLET | Freq: Four times a day (QID) | ORAL | Status: DC | PRN
  Administered 2011-04-27: 1 mg via ORAL
  Filled 2011-04-26: qty 1

## 2011-04-26 MED ORDER — LORAZEPAM 1 MG PO TABS: 0.0000 mg | ORAL_TABLET | Freq: Four times a day (QID) | ORAL | Status: DC

## 2011-04-26 MED ORDER — LORAZEPAM 2 MG/ML IJ SOLN: 1.0000 mg | Freq: Four times a day (QID) | INTRAMUSCULAR | Status: DC | PRN

## 2011-04-26 MED ORDER — VITAMIN B-1 100 MG PO TABS
100.0000 mg | ORAL_TABLET | Freq: Every day | ORAL | Status: DC
  Administered 2011-04-27: 100 mg via ORAL
  Filled 2011-04-26: qty 1

## 2011-04-26 MED ORDER — ALUM & MAG HYDROXIDE-SIMETH 200-200-20 MG/5ML PO SUSP: 30.0000 mL | ORAL | Status: DC | PRN

## 2011-04-26 MED ORDER — ONDANSETRON HCL 4 MG PO TABS: 4.0000 mg | ORAL_TABLET | Freq: Three times a day (TID) | ORAL | Status: DC | PRN

## 2011-04-26 MED ORDER — IBUPROFEN 600 MG PO TABS: 600.0000 mg | ORAL_TABLET | Freq: Three times a day (TID) | ORAL | Status: DC | PRN

## 2011-04-26 MED ORDER — LORAZEPAM 1 MG PO TABS: 0.0000 mg | ORAL_TABLET | Freq: Two times a day (BID) | ORAL | Status: DC

## 2011-04-26 MED ORDER — NICOTINE 21 MG/24HR TD PT24
21.0000 mg | MEDICATED_PATCH | Freq: Every day | TRANSDERMAL | Status: DC
  Filled 2011-04-26: qty 1

## 2011-04-26 MED ORDER — FOLIC ACID 1 MG PO TABS
1.0000 mg | ORAL_TABLET | Freq: Every day | ORAL | Status: DC
  Administered 2011-04-27: 1 mg via ORAL
  Filled 2011-04-26: qty 1

## 2011-04-26 MED ORDER — ADULT MULTIVITAMIN W/MINERALS CH
1.0000 | ORAL_TABLET | Freq: Every day | ORAL | Status: DC
  Administered 2011-04-27: 1 via ORAL
  Filled 2011-04-26: qty 1

## 2011-04-26 NOTE — ED Notes (Signed)
Pt states he is here for detox from alcohol  Pt states he wants to go to [facility]  Pt states he has depression and anxiety  Pt states he is not under anyones care for the depression at this time

## 2011-04-26 NOTE — ED Provider Notes (Signed)
History     CSN: [id number]  Arrival date & time 04/26/11  2050   First MD Initiated Contact with Patient 04/26/11 2159      Chief Complaint  Patient presents with  . Medical Clearance    (Consider location/radiation/quality/duration/timing/severity/associated sxs/prior treatment) The history is provided by the patient.   31 year old male with history of depression and anxiety who presents for alcohol detox. He states that he's been "drinking like this for a long time." Is unable to quantify how much he drinks daily but states he can sometimes drink up to 1/2 a gallon of liquor in a day. Last drink was this afternoon around 3-4 pm. He states that "I've had withdrawal symptoms in the past but have never had seizures." He feels somewhat anxious at this time.  States he has tried detox several times in the past at [facility]. He requests placement at [facility] if possible today. He does not have a primary psychiatrist whom he sees.  Denies SI, HI. Denies URI sx, cough, fever/chills. Has had no chest pain, shortness of breath, nausea, vomiting, diaphoresis. Denies abd pain, change in bowel habits, urinary sx.  Past Medical History  Diagnosis Date  . Anxiety   . Depression     History reviewed. No pertinent past surgical history.  History reviewed. No pertinent family history.  History  Substance Use Topics  . Smoking status: Current Some Day Smoker  . Smokeless tobacco: Not on file  . Alcohol Use: Yes      Review of Systems as per HPI  Allergies  Review of patient's allergies indicates no known allergies.  Home Medications   Current Outpatient Rx  Name Route Sig Dispense Refill  . ALBUTEROL SULFATE HFA 108 (90 BASE) MCG/ACT IN AERS Inhalation Inhale 2 puffs into the lungs every 6 (six) hours as needed. For shortness of breath    . FLUTICASONE-SALMETEROL 115-21 MCG/ACT IN AERO Inhalation Inhale 2 puffs into the lungs 2 (two) times daily.    . IBUPROFEN 400  MG PO TABS Oral Take 400 mg by mouth every 6 (six) hours as needed. For pain relief    . XANAX 0.25 MG PO TABS  TAKE 1/2 TO 1 TABLET     TWICE DAILY AS NEEDED FORANXIETY. 30 each 1    Pt needs appt w/ Dr. Sherene Sires    BP 130/97  Pulse 106  Temp(Src) 98.3 F (36.8 C) (Oral)  Resp 18  SpO2 96%  Physical Exam  Nursing note and vitals reviewed. Constitutional: He appears well-developed and well-nourished. No distress.  HENT:  Head: Normocephalic and atraumatic.  Right Ear: External ear normal.  Left Ear: External ear normal.  Mouth/Throat: Oropharynx is clear and moist. No oropharyngeal exudate.  Eyes: EOM are normal. Pupils are equal, round, and reactive to light.  Neck: Normal range of motion.  Cardiovascular: Normal rate, regular rhythm and normal heart sounds.   Pulmonary/Chest: Effort normal and breath sounds normal. He exhibits no tenderness.  Abdominal: Soft. Bowel sounds are normal. There is no tenderness.  Musculoskeletal: Normal range of motion.  Neurological: He is alert.  Skin: Skin is warm and dry. He is not diaphoretic.  Psychiatric: His mood appears anxious. His speech is not slurred. He is not actively hallucinating.       Odor of ETOH. Pt awake, alert, cooperative, conversant    ED Course  Procedures (including critical care time)  Labs Reviewed  BASIC METABOLIC PANEL - Abnormal; Notable for the following:  Glucose, Bld 105 (*)    All other components within normal limits  ETHANOL - Abnormal; Notable for the following:    Alcohol, Ethyl (B) 395 (*)    All other components within normal limits  CBC  URINE RAPID DRUG SCREEN (HOSP PERFORMED)  URINALYSIS, ROUTINE W REFLEX MICROSCOPIC   No results found.   No diagnosis found.    MDM  Pt presents requesting alcohol detox. Noted to have etoh of 395. However, he is awake, alert, conversant, and is able to walk. Will place on CIWA protocol. Discussed with ACT team who will see and work on placement, made them  aware of etoh level.        [place], [place] 04/27/11 [phone]

## 2011-04-27 NOTE — ED Provider Notes (Signed)
Medical screening examination/treatment/procedure(s) were performed by non-physician practitioner and as supervising physician I was immediately available for consultation/collaboration. Devoria Albe, MD, Armando Gang   Ward Givens, MD 04/27/11 1054

## 2011-04-27 NOTE — Discharge Instructions (Signed)
Alcohol Problems Most adults who drink alcohol drink in moderation (not a lot) are at low risk for developing problems related to their drinking. However, all drinkers, including low-risk drinkers, should know about the health risks connected with drinking alcohol. RECOMMENDATIONS FOR LOW-RISK DRINKING  Drink in moderation. Moderate drinking is defined as follows:   Men - no more than 2 drinks per day.   Nonpregnant women - no more than 1 drink per day.   Over age 30 - no more than 1 drink per day.  A standard drink is 12 grams of pure alcohol, which is equal to a 12 ounce bottle of beer or wine cooler, a 5 ounce glass of wine, or 1.5 ounces of distilled spirits (such as whiskey, brandy, vodka, or rum).  ABSTAIN FROM (DO NOT DRINK) ALCOHOL:  When pregnant or considering pregnancy.   When taking a medication that interacts with alcohol.   If you are alcohol dependent.   A medical condition that prohibits drinking alcohol (such as ulcer, liver disease, or heart disease).  DISCUSS WITH YOUR CAREGIVER:  If you are at risk for coronary heart disease, discuss the potential benefits and risks of alcohol use: Light to moderate drinking is associated with lower rates of coronary heart disease in certain populations (for example, men over age 30 and postmenopausal women). Infrequent or nondrinkers are advised not to begin light to moderate drinking to reduce the risk of coronary heart disease so as to avoid creating an alcohol-related problem. Similar protective effects can likely be gained through proper diet and exercise.   Women and the elderly have smaller amounts of body water than men. As a result women and the elderly achieve a higher blood alcohol concentration after drinking the same amount of alcohol.   Exposing a fetus to alcohol can cause a broad range of birth defects referred to as Fetal Alcohol Syndrome (FAS) or Alcohol-Related Birth Defects (ARBD). Although FAS/ARBD is connected with  excessive alcohol consumption during pregnancy, studies also have reported neurobehavioral problems in infants born to mothers reporting drinking an average of 1 drink per day during pregnancy.   Heavier drinking (the consumption of more than 4 drinks per occasion by men and more than 3 drinks per occasion by women) impairs learning (cognitive) and psychomotor functions and increases the risk of alcohol-related problems, including accidents and injuries.  CAGE QUESTIONS:   Have you ever felt that you should Cut down on your drinking?   Have people Annoyed you by criticizing your drinking?   Have you ever felt bad or Guilty about your drinking?   Have you ever had a drink first thing in the morning to steady your nerves or get rid of a hangover (Eye opener)?  If you answered positively to any of these questions: You may be at risk for alcohol-related problems if alcohol consumption is:   Men: Greater than 14 drinks per week or more than 4 drinks per occasion.   Women: Greater than 7 drinks per week or more than 3 drinks per occasion.  Do you or your family have a medical history of alcohol-related problems, such as:  Blackouts.   Sexual dysfunction.   Depression.   Trauma.   Liver dysfunction.   Sleep disorders.   Hypertension.   Chronic abdominal pain.   Has your drinking ever caused you problems, such as problems with your family, problems with your work (or school) performance, or accidents/injuries?   Do you have a compulsion to drink or a preoccupation  with drinking?   Do you have poor control or are you unable to stop drinking once you have started?   Do you have to drink to avoid withdrawal symptoms?   Do you have problems with withdrawal such as tremors, nausea, sweats, or mood disturbances?   Does it take more alcohol than in the past to get you high?   Do you feel a strong urge to drink?   Do you change your plans so that you can have a drink?   Do you ever  drink in the morning to relieve the shakes or a hangover?  If you have answered a number of the previous questions positively, it may be time for you to talk to your caregivers, family, and friends and see if they think you have a problem. Alcoholism is a chemical dependency that keeps getting worse and will eventually destroy your health and relationships. Many alcoholics end up dead, impoverished, or in prison. This is often the end result of all chemical dependency.  Do not be discouraged if you are not ready to take action immediately.   Decisions to change behavior often involve up and down desires to change and feeling like you cannot decide.   Try to think more seriously about your drinking behavior.   Think of the reasons to quit.  WHERE TO GO FOR ADDITIONAL INFORMATION   The National Institute on Alcohol Abuse and Alcoholism (NIAAA)www.niaaa.nih.gov   [employer] on Alcoholism and Drug Dependence (NCADD)www.ncadd.org   American Society of Addiction Medicine (ASAM)www.https://anderson-johnson.com/  Document Released: 12/26/2004 Document Revised: 12/15/2010 Document Reviewed: 08/14/2007 [facility] Patient Information 2012 [place], [place]. RESOURCE GUIDE  Dental Problems  Patients with Medicaid: [facility]                     [phone] [address]                                           Phone:  [phone]                                                  If unable to pay or uninsured, contact:  [facility] or [facility]. to become qualified for the adult dental clinic.  Chronic Pain Problems Contact [facility]  [phone] Patients need to be referred by their primary care doctor.  Insufficient Money for Medicine Contact United Way:  call "211" or [facility] 831-220-1200.  No Primary Care Doctor Call [facility]  [phone] Other agencies that provide inexpensive medical care    [facility]  [phone]    [facility]  Internal Medicine  [phone]    [facility]  [phone]    [facility]  [phone]    [facility]  804-527-9162    [facility]  [phone]  Substance Abuse Resources [facility] Services  [phone] [facility] [phone] [place] 9491473817 Floydene Flock [phone] Residential & Outpatient Substance Abuse Program  [phone]  Psychological Services [facility] Behavioral Health  [phone] [facility]  [phone] [facility] Mental [facility]   [phone] (emergency services [phone])  Abuse/Neglect [facility] Child Abuse Hotline 4161435465 [facility] Child  Abuse Hotline 732-730-6426 (After Hours)  Emergency Shelter [facility] [phone]  Maternity Homes [facility] [place] [facility] [place] [phone] [facility] 2152143061  MRSA Hotline #:   207-755-5870    [facility] Resources  [facility] [place]  United Way                           [facility] [address] [place]                     [address]         [address]  Blondell Reveal Phone:  [phone]                                  Phone:  [phone]                   Phone:  [phone]  [facility] Mental Health Phone:  [phone]  [facility] Child Abuse Hotline [phone] (After Hours)

## 2011-04-27 NOTE — ED Provider Notes (Signed)
Patient had checked himself into the emergency department for alcohol detox.  Patient has had multiple discussions with multiple social workers in with family members as well as the psychiatric nursing staff.  Patient is now become indecisive regarding this decision to undergo alcohol detox.  Patient is not suicidal or homicidal.  I have discussed the need for the patient to make a decision in the next hour so that we can proceed with further resource planning for this patient via inpatient or outpatient followup.  4:37 PM Patient has decided that he wishes to be discharged home at this time due to job interviews and family obligations tomorrow.  Nat Christen, MD 04/27/11 [phone]

## 2011-04-27 NOTE — BH Assessment (Signed)
Attempted to assess patient and he sts, "I don't know if I need to stay". Writer asked patient would he like substance abuse treatment and pt sts, "I really don't know". Patient goes on to state that he needs to speak with someone about the care of his child before making a decision to stay for treatment.

## 2011-04-27 NOTE — BH Assessment (Signed)
Returned to patients room after he completed his phone call and asked if he would like help for substance abuse. Patient indecisive and wanting treatment. Patients nurse-Mike present during the conversation and strongly encourages patient to seek treatment. Patient acknowledges that he has difficulty with remaining sober. Kathlene November, again gives patient reasons to stay for detox by providing patient with encouraging words and potential outcomes from seeking treatment. Patient would like additional time to make a decision. Writer will follow up with patient shortly regarding his decision for treatment.

## 2011-04-27 NOTE — BH Assessment (Signed)
Patient to be discharged home per his request. Staff has offered patient recommendations, advice, support, and encourage to seek in-pt treatment. Patient continues to request discharge home. Writer provided patient with various referrals to support groups, mobile crises, individual substance abuse services, etc.

## 2011-05-21 ENCOUNTER — Encounter (HOSPITAL_COMMUNITY): Payer: Self-pay | Admitting: Emergency Medicine

## 2011-05-21 ENCOUNTER — Emergency Department (HOSPITAL_COMMUNITY)
Admission: EM | Admit: 2011-05-21 | Discharge: 2011-05-22 | Disposition: A | Discharge status: Home / Self Care | Payer: Self-pay | Attending: Emergency Medicine | Admitting: Emergency Medicine

## 2011-05-21 DIAGNOSIS — F10929 Alcohol use, unspecified with intoxication, unspecified: Secondary | ICD-10-CM

## 2011-05-21 DIAGNOSIS — F1010 Alcohol abuse, uncomplicated: Secondary | ICD-10-CM | POA: Insufficient documentation

## 2011-05-21 DIAGNOSIS — F329 Major depressive disorder, single episode, unspecified: Secondary | ICD-10-CM

## 2011-05-21 DIAGNOSIS — F419 Anxiety disorder, unspecified: Secondary | ICD-10-CM

## 2011-05-21 DIAGNOSIS — F32A Depression, unspecified: Secondary | ICD-10-CM

## 2011-05-21 DIAGNOSIS — F341 Dysthymic disorder: Secondary | ICD-10-CM | POA: Insufficient documentation

## 2011-05-21 LAB — CBC
HCT: 49 % (ref 39.0–52.0)
Hemoglobin: 17 g/dL (ref 13.0–17.0)
MCH: 33.3 pg (ref 26.0–34.0)
MCHC: 34.7 g/dL (ref 30.0–36.0)
MCV: 96.1 fL (ref 78.0–100.0)
RBC: 5.1 MIL/uL (ref 4.22–5.81)

## 2011-05-21 LAB — COMPREHENSIVE METABOLIC PANEL
BUN: 12 mg/dL (ref 6–23)
CO2: 24 mEq/L (ref 19–32)
Calcium: 8.8 mg/dL (ref 8.4–10.5)
Creatinine, Ser: 0.91 mg/dL (ref 0.50–1.35)
GFR calc Af Amer: >90 mL/min (ref >90)
GFR calc non Af Amer: >90 mL/min (ref >90)
Glucose, Bld: 95 mg/dL (ref 70–99)
Total Protein: 8.2 g/dL (ref 6.0–8.3)

## 2011-05-21 LAB — ETHANOL: Alcohol, Ethyl (B): 353 mg/dL — ABNORMAL HIGH (ref 0–11)

## 2011-05-21 MED ORDER — LORAZEPAM 1 MG PO TABS
0.0000 mg | ORAL_TABLET | Freq: Four times a day (QID) | ORAL | Status: DC
  Administered 2011-05-21 – 2011-05-22 (×2): 1 mg via ORAL
  Filled 2011-05-21 (×2): qty 1

## 2011-05-21 MED ORDER — ACETAMINOPHEN 325 MG PO TABS: 650.0000 mg | ORAL_TABLET | ORAL | Status: DC | PRN

## 2011-05-21 MED ORDER — THIAMINE HCL 100 MG/ML IJ SOLN: 100.0000 mg | Freq: Every day | INTRAMUSCULAR | Status: DC

## 2011-05-21 MED ORDER — IBUPROFEN 600 MG PO TABS: 600.0000 mg | ORAL_TABLET | Freq: Three times a day (TID) | ORAL | Status: DC | PRN

## 2011-05-21 MED ORDER — ADULT MULTIVITAMIN W/MINERALS CH
1.0000 | ORAL_TABLET | Freq: Every day | ORAL | Status: DC
  Administered 2011-05-22: 1 via ORAL

## 2011-05-21 MED ORDER — LORAZEPAM 2 MG/ML IJ SOLN: 1.0000 mg | Freq: Four times a day (QID) | INTRAMUSCULAR | Status: DC | PRN

## 2011-05-21 MED ORDER — VITAMIN B-1 100 MG PO TABS
100.0000 mg | ORAL_TABLET | Freq: Every day | ORAL | Status: DC
  Administered 2011-05-22: 100 mg via ORAL
  Filled 2011-05-21: qty 1

## 2011-05-21 MED ORDER — ALUM & MAG HYDROXIDE-SIMETH 200-200-20 MG/5ML PO SUSP: 30.0000 mL | ORAL | Status: DC | PRN

## 2011-05-21 MED ORDER — LORAZEPAM 1 MG PO TABS
1.0000 mg | ORAL_TABLET | Freq: Four times a day (QID) | ORAL | Status: DC | PRN
  Administered 2011-05-22: 1 mg via ORAL
  Filled 2011-05-21: qty 1

## 2011-05-21 MED ORDER — ZOLPIDEM TARTRATE 5 MG PO TABS
5.0000 mg | ORAL_TABLET | Freq: Every evening | ORAL | Status: DC | PRN
  Administered 2011-05-21: 5 mg via ORAL
  Filled 2011-05-21: qty 1

## 2011-05-21 MED ORDER — LORAZEPAM 1 MG PO TABS: 0.0000 mg | ORAL_TABLET | Freq: Two times a day (BID) | ORAL | Status: DC

## 2011-05-21 MED ORDER — NICOTINE 21 MG/24HR TD PT24
21.0000 mg | MEDICATED_PATCH | Freq: Every day | TRANSDERMAL | Status: DC
  Filled 2011-05-21: qty 1

## 2011-05-21 MED ORDER — ONDANSETRON HCL 4 MG PO TABS: 4.0000 mg | ORAL_TABLET | Freq: Three times a day (TID) | ORAL | Status: DC | PRN

## 2011-05-21 MED ORDER — FOLIC ACID 1 MG PO TABS
1.0000 mg | ORAL_TABLET | Freq: Every day | ORAL | Status: DC
  Administered 2011-05-22: 1 mg via ORAL
  Filled 2011-05-21: qty 1

## 2011-05-21 NOTE — ED Notes (Signed)
Pt stated he had 2 bottles of champagne and some vodka before arriving today. He states he is "just dealing with family things and stress" that he did not go into detail about. States he goes weeks without drinking but when the stress is too much he "falls off the wagon" as was the case this admission. No active SI or HI although pt stated he would "drink himself to death if he could". Reports depression ongoing.

## 2011-05-21 NOTE — ED Notes (Signed)
Ian [name] with ACTT in to assess at this time.

## 2011-05-21 NOTE — ED Notes (Signed)
Pt alert, nad, arrives from home, c/o depression, detox from alcohol, onset several days ago, states last drink pta, denies SI.HI, resp even unlabored, skin pwd

## 2011-05-21 NOTE — ED Notes (Signed)
Pt provided paper scrubs with instruction 

## 2011-05-21 NOTE — ED Provider Notes (Addendum)
History     CSN: [id number]  Arrival date & time 05/21/11  2108   First MD Initiated Contact with Patient 05/21/11 2141      Chief Complaint  Patient presents with  . Medical Clearance  . Alcohol Problem    (Consider location/radiation/quality/duration/timing/severity/associated sxs/prior treatment) HPI  31yoM h/o depression, anxiety, etoh abuse since requesting for detox from alcohol. The patient states that he has been depressed more than usual recently and has been feeling anxious as well. He states he's been drinking more alcohol than usual. His last drink was approximately one hour prior to arrival. He states that he does not drink daily but rather goes on binges of drinking too much. He states he is here for residential treatment. He does report that he has had some withdrawal to person hallucinations from alcohol withdrawal in the past. He denies suicidal ideation but states "sometimes I think I drink myself to death". He denies homicidal ideation. He denies auditory visual hallucinations recently. He denies illicit drugs. Currently in [facility] program  States that he lost insurance 3 months ago and has not taken any medications for depression or anxiety since that time  ED Notes, ED Provider Notes from 05/21/11 0000 to 05/21/11 21:39:55       Dorathy Daft, RN 05/21/2011 21:39      Pt alert, nad, arrives from home, c/o depression, detox from alcohol, onset several days ago, states last drink pta, denies SI.HI, resp even unlabored, skin pwd   Past Medical History  Diagnosis Date  . Anxiety   . Depression     History reviewed. No pertinent past surgical history.  No family history on file.  History  Substance Use Topics  . Smoking status: Current Some Day Smoker  . Smokeless tobacco: Not on file  . Alcohol Use: Yes    Review of Systems  All other systems reviewed and are negative.   except as noted HPI    Allergies  Review of patient's allergies indicates no known  allergies.  Home Medications   Current Outpatient Rx  Name Route Sig Dispense Refill  . ALBUTEROL SULFATE HFA 108 (90 BASE) MCG/ACT IN AERS Inhalation Inhale 2 puffs into the lungs every 6 (six) hours as needed. For shortness of breath    . FLUTICASONE-SALMETEROL 115-21 MCG/ACT IN AERO Inhalation Inhale 2 puffs into the lungs 2 (two) times daily.    . IBUPROFEN 400 MG PO TABS Oral Take 400 mg by mouth every 6 (six) hours as needed. For pain relief    . XANAX 0.25 MG PO TABS  TAKE 1/2 TO 1 TABLET     TWICE DAILY AS NEEDED FORANXIETY. 30 each 1    Pt needs appt w/ Dr. Sherene Sires    BP 127/87  Pulse 115  Temp(Src) 98.5 F (36.9 C) (Oral)  Resp 20  Wt 195 lb (88.451 kg)  SpO2 98%  Physical Exam  Nursing note and vitals reviewed. Constitutional: He is oriented to person, place, and time. He appears well-developed and well-nourished. No distress.       Appears to be intoxicated  HENT:  Head: Atraumatic.  Mouth/Throat: Oropharynx is clear and moist.  Eyes: Conjunctivae are normal. Pupils are equal, round, and reactive to light.  Neck: Neck supple.  Cardiovascular: Regular rhythm, normal heart sounds and intact distal pulses.  Exam reveals no gallop and no friction rub.   No murmur heard.      tachycardic  Pulmonary/Chest: Effort normal. No respiratory distress. He has no  wheezes. He has no rales.  Abdominal: Soft. Bowel sounds are normal. He exhibits no distension. There is no tenderness. There is no rebound and no guarding.  Musculoskeletal: Normal range of motion. He exhibits no edema and no tenderness.  Neurological: He is alert and oriented to person, place, and time. No cranial nerve deficit. Coordination normal.  Skin: Skin is warm and dry.  Psychiatric: He has a normal mood and affect.    ED Course  Procedures (including critical care time)  Labs Reviewed  CBC - Abnormal; Notable for the following:    Platelets 428 (*)    All other components within normal limits    COMPREHENSIVE METABOLIC PANEL  ETHANOL  URINE RAPID DRUG SCREEN (HOSP PERFORMED)   No results found.   1. Anxiety   2. Depression   3. Alcohol abuse   4. Alcohol intoxication     MDM  H/O ETOH abuse presents requesting etoh detox. Recently worsening anxiety and depression. Mildly tachycardic here. Appears to be clinically intoxicated and claims to have last drink just prior to arrival. CIWA protocol in place. Discussed with ACT team who will evaluate the patient.   Medically cleared. ACT evaluating. UDS pending      Forbes Cellar, MD 05/21/11 4401  Forbes Cellar, MD 05/22/11 0025

## 2011-05-22 ENCOUNTER — Inpatient Hospital Stay (HOSPITAL_COMMUNITY): Admission: AD | Admit: 2011-05-22 | Payer: Self-pay | Source: Other Acute Inpatient Hospital | Admitting: Psychiatry

## 2011-05-22 LAB — RAPID URINE DRUG SCREEN, HOSP PERFORMED
Barbiturates: NOT DETECTED
Benzodiazepines: NOT DETECTED
Cocaine: NOT DETECTED

## 2011-05-22 LAB — ETHANOL
Alcohol, Ethyl (B): 188 mg/dL — ABNORMAL HIGH (ref 0–11)
Alcohol, Ethyl (B): 72 mg/dL — ABNORMAL HIGH (ref 0–11)

## 2011-05-22 NOTE — BH Assessment (Signed)
Yolanda RN at [facility] - Dr. Theotis Barrio wants BAL < 150, then he can be accepted at [facility].

## 2011-05-22 NOTE — ED Notes (Signed)
Requested pt remove underwear, removed by patient & placed in belongings bag, locked in #37

## 2011-05-22 NOTE — BH Assessment (Signed)
Assessment Note   Ian [name] is an 31 y.o. male who presents voluntarily at [facility] via EMS with request for detox from alcohol. Pt lives at [facility]. He endorses depressed mood including isolating, worthlessness, loss of interest, and irritability. He also endorses moderate depression. His affect is appropriate to circumstances and he appeared slightly intoxicated with slurred speech (BAL is 353). Pt denies SI and HI. He denies A/VH and no delusions noted. Pt reports first drinking alcohol at age 29. He has been drinking heavily since age 18. He drinks "until I black out" and goes on binges. Pt reports drinking 2 bottles of champagne 05/21/11. His longest period of sobriety is 3 months. Previous withdrawal symptoms include DTs, tremors and agitation. He went to [facility] in 2011 for alcohol abuse treatment. He went to [facility] in April 2012 for alcohol dependence. Pt also used cocaine and THC but hasn't used either substance in over 10 years (see info below). Current stressor in his life is his job [employer].  Pt states he called cops on his parents tonight. He says that he was at parents' house and he knew they wouldn't let him inside so he called cops. [facility] then brought [facility] to [facility].  Axis I:   Alcohol Dependence             296.32 Major Depressive Disorder, Recurrent, Moderate Axis II: Deferred Axis III:  Past Medical History  Diagnosis Date  . Anxiety   . Depression    Axis IV: economic problems, occupational problems and problems with primary support group Axis V: 31-40 impairment in reality testing  Past Medical History:  Past Medical History  Diagnosis Date  . Anxiety   . Depression     History reviewed. No pertinent past surgical history.  Family History: No family history on file.  Social History:  reports that he has been smoking.  He does not have any smokeless tobacco history on file. He reports that he drinks alcohol. He reports that he does not use illicit  drugs.  Additional Social History:  Alcohol / Drug Use Pain Medications: n/a Prescriptions: n/a Over the Counter: n/a History of alcohol / drug use?: Yes Longest period of sobriety (when/how long): 3 months Withdrawal Symptoms: Tremors;DTs;Agitation Substance #1 Name of Substance 1: alcohol 1 - Age of First Use: 16 1 - Amount (size/oz): "until I black out" 1 - Frequency: binges 1 - Duration: drinking heavily since age 31 1 - Last Use / Amount: 05/21/11 - 2 bottles champagne Substance #2 Name of Substance 2: thc 2 - Age of First Use: 16 2 - Amount (size/oz): varies 2 - Frequency: on the weekends 2 - Duration: smoked in high school and college 2 - Last Use / Amount: not since college Substance #3 Name of Substance 3: cocaine 3 - Age of First Use: unknown 3 - Amount (size/oz): varies 3 - Frequency: twice per month 3 - Duration: unknown 3 - Last Use / Amount: 2003 Allergies: No Known Allergies  Home Medications:  (Not in a hospital admission)  OB/GYN Status:  No LMP for male patient.  General Assessment Data Location of Assessment: [facility] ED Living Arrangements: Other (Comment) ([facility]) Can pt return to current living arrangement?: Yes Admission Status: Voluntary Is patient capable of signing voluntary admission?: Yes Transfer from: Acute Hospital Referral Source: Self/Family/Friend  Education Status Is patient currently in school?: No Current Grade: n/a Highest grade of school patient has completed: 41 Name of school: BS from [employer]  Contact person: n/a  Risk to self Suicidal Ideation: No Suicidal Intent: No Is patient at risk for suicide?: No Suicidal Plan?: No Access to Means: No What has been your use of drugs/alcohol within the last 12 months?: heavy drinker Previous Attempts/Gestures: No How many times?: 0  Other Self Harm Risks: n/a Triggers for Past Attempts:  (n/a) Intentional Self Injurious Behavior: None Family Suicide History:  No Recent stressful life event(s): Other (Comment) (looking for job) Persecutory voices/beliefs?: No Depression: Yes Depression Symptoms: Despondent;Isolating;Feeling angry/irritable;Feeling worthless/self pity;Loss of interest in usual pleasures Substance abuse history and/or treatment for substance abuse?: Yes Suicide prevention information given to non-admitted patients: Not applicable  Risk to Others Homicidal Ideation: No Thoughts of Harm to Others: No Current Homicidal Intent: No Current Homicidal Plan: No Access to Homicidal Means: No Identified Victim: n/a History of harm to others?: No Assessment of Violence: None Noted Violent Behavior Description: n/a Does patient have access to weapons?: No Criminal Charges Pending?: No Does patient have a court date: No  Psychosis Hallucinations: None noted Delusions: None noted  Mental Status Report Appear/Hygiene: Disheveled Eye Contact: Good Motor Activity: Freedom of movement Speech: Slurred;Logical/coherent Level of Consciousness: Drowsy;Alert Mood: Depressed;Anxious Affect: Appropriate to circumstance;Other (Comment) (intoxicated) Anxiety Level: Moderate Thought Processes: Relevant;Coherent Judgement: Impaired Orientation: Person;Place;Time;Situation Obsessive Compulsive Thoughts/Behaviors: None  Cognitive Functioning Concentration: Decreased Memory: Recent Impaired;Remote Impaired IQ: Average Insight: Fair Impulse Control: Fair Appetite: Good Weight Loss: 0  Weight Gain: 0  Sleep: No Change Total Hours of Sleep: 3  (without meds to help him sleep) Vegetative Symptoms: None  Prior Inpatient Therapy Prior Inpatient Therapy: Yes Prior Therapy Dates: Aug 2011 & April 2012 [facility] Reason for Treatment: Substance abuse & depression/anxiety/detox  Prior Outpatient Therapy Prior Outpatient Therapy: Yes Prior Therapy Dates: currently Prior Therapy  Facilty/Provider(s): [facility] Reason for Treatment: therapist for depression/anxiety (will be able to see MD at [facility] end of May)  ADL Screening (condition at time of admission) Patient's cognitive ability adequate to safely complete daily activities?: Yes Patient able to express need for assistance with ADLs?: Yes Independently performs ADLs?: Yes Weakness of Legs: None Weakness of Arms/Hands: None  Home Assistive Devices/Equipment Home Assistive Devices/Equipment: Eyeglasses    Abuse/Neglect Assessment (Assessment to be complete while patient is alone) Physical Abuse: Denies Verbal Abuse: Denies Sexual Abuse: Denies Exploitation of patient/patient's resources: Denies Self-Neglect: Denies Values / Beliefs Cultural Requests During Hospitalization: None Spiritual Requests During Hospitalization: None   Advance Directives (For Healthcare) Advance Directive: Patient would not like information;Patient does not have advance directive    Additional Information 1:1 In Past 12 Months?: No CIRT Risk: No Elopement Risk: No Does patient have medical clearance?: Yes     Disposition:  Disposition Disposition of Patient: Inpatient treatment program Type of inpatient treatment program: Adult (detox)  On Site Evaluation by:   Reviewed with Physician:     Donnamarie Rossetti P 05/22/2011 1:23 AM

## 2011-05-22 NOTE — ED Provider Notes (Signed)
Patient is here voluntarily and would like to be discharged.  This was accommodated.  Gerhard Munch, MD 05/22/11 [phone]

## 2011-07-03 ENCOUNTER — Encounter (HOSPITAL_BASED_OUTPATIENT_CLINIC_OR_DEPARTMENT_OTHER): Payer: Self-pay | Admitting: *Deleted

## 2011-07-03 ENCOUNTER — Emergency Department (HOSPITAL_BASED_OUTPATIENT_CLINIC_OR_DEPARTMENT_OTHER)
Admission: EM | Admit: 2011-07-03 | Discharge: 2011-07-04 | Disposition: A | Discharge status: Other Acute Inpatient Hospital | Payer: Self-pay | Attending: Emergency Medicine | Admitting: Emergency Medicine

## 2011-07-03 DIAGNOSIS — Z9119 Patient's noncompliance with other medical treatment and regimen: Secondary | ICD-10-CM | POA: Insufficient documentation

## 2011-07-03 DIAGNOSIS — F32A Depression, unspecified: Secondary | ICD-10-CM

## 2011-07-03 DIAGNOSIS — F411 Generalized anxiety disorder: Secondary | ICD-10-CM | POA: Insufficient documentation

## 2011-07-03 DIAGNOSIS — F1010 Alcohol abuse, uncomplicated: Secondary | ICD-10-CM | POA: Insufficient documentation

## 2011-07-03 DIAGNOSIS — F419 Anxiety disorder, unspecified: Secondary | ICD-10-CM

## 2011-07-03 DIAGNOSIS — Z91199 Patient's noncompliance with other medical treatment and regimen due to unspecified reason: Secondary | ICD-10-CM | POA: Insufficient documentation

## 2011-07-03 DIAGNOSIS — F329 Major depressive disorder, single episode, unspecified: Secondary | ICD-10-CM | POA: Insufficient documentation

## 2011-07-03 DIAGNOSIS — F10929 Alcohol use, unspecified with intoxication, unspecified: Secondary | ICD-10-CM

## 2011-07-03 DIAGNOSIS — F17200 Nicotine dependence, unspecified, uncomplicated: Secondary | ICD-10-CM | POA: Insufficient documentation

## 2011-07-03 DIAGNOSIS — F3289 Other specified depressive episodes: Secondary | ICD-10-CM | POA: Insufficient documentation

## 2011-07-03 DIAGNOSIS — Z79899 Other long term (current) drug therapy: Secondary | ICD-10-CM | POA: Insufficient documentation

## 2011-07-03 HISTORY — DX: Essential (primary) hypertension: I10

## 2011-07-03 LAB — COMPREHENSIVE METABOLIC PANEL
AST: 46 U/L — ABNORMAL HIGH (ref 0–37)
Albumin: 4.2 g/dL (ref 3.5–5.2)
BUN: 14 mg/dL (ref 6–23)
Creatinine, Ser: 0.9 mg/dL (ref 0.50–1.35)
Total Protein: 7.9 g/dL (ref 6.0–8.3)

## 2011-07-03 LAB — URINALYSIS, ROUTINE W REFLEX MICROSCOPIC
Ketones, ur: NEGATIVE mg/dL
Leukocytes, UA: NEGATIVE
Nitrite: NEGATIVE
Specific Gravity, Urine: 1.007 (ref 1.005–1.030)
pH: 6 (ref 5.0–8.0)

## 2011-07-03 LAB — RAPID URINE DRUG SCREEN, HOSP PERFORMED
Benzodiazepines: NOT DETECTED
Cocaine: NOT DETECTED
Opiates: NOT DETECTED

## 2011-07-03 LAB — DIFFERENTIAL
Basophils Absolute: 0.1 10*3/uL (ref 0.0–0.1)
Basophils Relative: 1 % (ref 0–1)
Eosinophils Absolute: 0.1 10*3/uL (ref 0.0–0.7)
Monocytes Absolute: 1.1 10*3/uL — ABNORMAL HIGH (ref 0.1–1.0)
Monocytes Relative: 16 % — ABNORMAL HIGH (ref 3–12)
Neutrophils Relative %: 35 % — ABNORMAL LOW (ref 43–77)

## 2011-07-03 LAB — CBC
HCT: 44.3 % (ref 39.0–52.0)
Hemoglobin: 15.8 g/dL (ref 13.0–17.0)
MCH: 33 pg (ref 26.0–34.0)
MCHC: 35.7 g/dL (ref 30.0–36.0)
RDW: 13.3 % (ref 11.5–15.5)

## 2011-07-03 LAB — ETHANOL: Alcohol, Ethyl (B): 329 mg/dL — ABNORMAL HIGH (ref 0–11)

## 2011-07-03 MED ORDER — ACETAMINOPHEN 325 MG PO TABS: 650.0000 mg | ORAL_TABLET | ORAL | Status: DC | PRN

## 2011-07-03 MED ORDER — ALUM & MAG HYDROXIDE-SIMETH 200-200-20 MG/5ML PO SUSP: 30.0000 mL | ORAL | Status: DC | PRN

## 2011-07-03 MED ORDER — LORAZEPAM 1 MG PO TABS
1.0000 mg | ORAL_TABLET | Freq: Once | ORAL | Status: AC
  Administered 2011-07-03: 1 mg via ORAL
  Filled 2011-07-03: qty 1

## 2011-07-03 MED ORDER — ONDANSETRON HCL 8 MG PO TABS: 4.0000 mg | ORAL_TABLET | Freq: Three times a day (TID) | ORAL | Status: DC | PRN

## 2011-07-03 MED ORDER — ZOLPIDEM TARTRATE 5 MG PO TABS
5.0000 mg | ORAL_TABLET | Freq: Every evening | ORAL | Status: DC | PRN
  Filled 2011-07-03: qty 1

## 2011-07-03 MED ORDER — IBUPROFEN 400 MG PO TABS: 600.0000 mg | ORAL_TABLET | Freq: Three times a day (TID) | ORAL | Status: DC | PRN

## 2011-07-03 MED ORDER — LORAZEPAM 1 MG PO TABS: 1.0000 mg | ORAL_TABLET | ORAL | Status: DC | PRN

## 2011-07-03 NOTE — ED Notes (Signed)
[facility] called to come evaluate pt.

## 2011-07-03 NOTE — ED Notes (Signed)
Pt reports that he has an ongoing depression/ETOH abuse problem and today some relationship problems led him to drink alcohol.  Pt requesting help with ETOH abuse and depression.

## 2011-07-03 NOTE — ED Notes (Signed)
States he is craving alcohol and needs something to help.

## 2011-07-03 NOTE — ED Notes (Signed)
Misty Stanley from [employer] is on her way.

## 2011-07-03 NOTE — ED Provider Notes (Signed)
History   This chart was scribed for Ian Booze, MD by Melba Coon. The patient was seen in room MH12/MH12 and the patient's care was started at 9:32PM.    CSN: [id number]  Arrival date & time 07/03/11  2039   First MD Initiated Contact with Patient 07/03/11 2122      Chief Complaint  Patient presents with  . Depression  . Alcohol Problem  . Anxiety    (Consider location/radiation/quality/duration/timing/severity/associated sxs/prior treatment) HPI Ian [name] is a 31 y.o. male who presents to the Emergency Department complaining of persistent alcohol consumption with associated depression and anxiety with an onset 5 days ago. Pt admittedly states he's an alcoholic that is depressed. Pt is supposed to be taking Zoloft and Xanax but has recently lost his health insurance so he can not get any more meds. SI and plan for SI present; pt has stated he wants to kill himself be drinking to death. Latest binge has been going on for 4-5 days. Binge before last, which was 2 weeks ago, pt states he drank 6 gallons before, and that this current binge is "not as bad". Last alcoholic drink was 2.5 hrs ago; pt states he has consumed 1.5 pints in the last 24 hrs. Pt states he needs help; didn't state a particular reason why he came to the ED today. Loss of sleep, crying, anhedonia present. Hallucinations/ seeing things present. No illicit drug use. No HA, fever, neck pain, sore throat, rash, back pain, CP, SOB, abd pain, n/v/d, dysuria, or extremity pain, edema, weakness, numbness, or tingling. No known allergies. No other pertinent medical symptoms. Current smoker.  No PCP  Past Medical History  Diagnosis Date  . Anxiety   . Depression   . Hypertension     History reviewed. No pertinent past surgical history.  History reviewed. No pertinent family history.  History  Substance Use Topics  . Smoking status: Current Some Day Smoker  . Smokeless tobacco: Not on file  . Alcohol Use:  Yes     pt drinks liquor daily. Pt last consume etoh @1  hour ago      Review of Systems 10 Systems reviewed and all are negative for acute change except as noted in the HPI.   Allergies  Review of patient's allergies indicates no known allergies.  Home Medications   Current Outpatient Rx  Name Route Sig Dispense Refill  . ALBUTEROL SULFATE HFA 108 (90 BASE) MCG/ACT IN AERS Inhalation Inhale 2 puffs into the lungs every 6 (six) hours as needed. For shortness of breath    . IBUPROFEN 400 MG PO TABS Oral Take 400 mg by mouth every 6 (six) hours as needed. For pain relief    . XANAX 0.25 MG PO TABS  TAKE 1/2 TO 1 TABLET     TWICE DAILY AS NEEDED FORANXIETY. 30 each 1    Pt needs appt w/ Dr. Sherene Sires    BP 132/84  Pulse 115  Temp 97.9 F (36.6 C) (Oral)  Resp 16  SpO2 96%  Physical Exam  Nursing note and vitals reviewed. Constitutional: He is oriented to person, place, and time. He appears well-developed and well-nourished. No distress.  HENT:  Head: Normocephalic and atraumatic.  Eyes: EOM are normal.  Neck: Normal range of motion. No tracheal deviation present.  Cardiovascular: Normal rate.   Pulmonary/Chest: Effort normal. No respiratory distress.  Musculoskeletal: Normal range of motion.  Neurological: He is alert and oriented to person, place, and time.  Skin: Skin is  warm and dry.  Psychiatric: He exhibits a depressed mood (Appears depressed).    ED Course  Procedures (including critical care time)  DIAGNOSTIC STUDIES: Oxygen Saturation is 96% on room air, normal by my interpretation.    COORDINATION OF CARE:  9:35PM - EDMD will consult with CRISIS; drug screen, blood w/u and UA will be ordered for the pt.   Results for orders placed during the hospital encounter of 07/03/11  CBC      Component Value Range   WBC 7.2  4.0 - 10.5 K/uL   RBC 4.79  4.22 - 5.81 MIL/uL   Hemoglobin 15.8  13.0 - 17.0 g/dL   HCT 81.1  91.4 - 78.2 %   MCV 92.5  78.0 - 100.0 fL    MCH 33.0  26.0 - 34.0 pg   MCHC 35.7  30.0 - 36.0 g/dL   RDW 95.6  21.3 - 08.6 %   Platelets 354  150 - 400 K/uL  DIFFERENTIAL      Component Value Range   Neutrophils Relative 35 (*) 43 - 77 %   Neutro Abs 2.5  1.7 - 7.7 K/uL   Lymphocytes Relative 48 (*) 12 - 46 %   Lymphs Abs 3.4  0.7 - 4.0 K/uL   Monocytes Relative 16 (*) 3 - 12 %   Monocytes Absolute 1.1 (*) 0.1 - 1.0 K/uL   Eosinophils Relative 1  0 - 5 %   Eosinophils Absolute 0.1  0.0 - 0.7 K/uL   Basophils Relative 1  0 - 1 %   Basophils Absolute 0.1  0.0 - 0.1 K/uL  COMPREHENSIVE METABOLIC PANEL      Component Value Range   Sodium 142  135 - 145 mEq/L   Potassium 3.8  3.5 - 5.1 mEq/L   Chloride 102  96 - 112 mEq/L   CO2 24  19 - 32 mEq/L   Glucose, Bld 103 (*) 70 - 99 mg/dL   BUN 14  6 - 23 mg/dL   Creatinine, Ser 5.78  0.50 - 1.35 mg/dL   Calcium 9.7  8.4 - 46.9 mg/dL   Total Protein 7.9  6.0 - 8.3 g/dL   Albumin 4.2  3.5 - 5.2 g/dL   AST 46 (*) 0 - 37 U/L   ALT 73 (*) 0 - 53 U/L   Alkaline Phosphatase 86  39 - 117 U/L   Total Bilirubin 0.2 (*) 0.3 - 1.2 mg/dL   GFR calc non Af Amer >90  >90 mL/min   GFR calc Af Amer >90  >90 mL/min  ETHANOL      Component Value Range   Alcohol, Ethyl (B) 329 (*) 0 - 11 mg/dL  URINALYSIS, ROUTINE W REFLEX MICROSCOPIC      Component Value Range   Color, Urine YELLOW  YELLOW   APPearance CLEAR  CLEAR   Specific Gravity, Urine 1.007  1.005 - 1.030   pH 6.0  5.0 - 8.0   Glucose, UA NEGATIVE  NEGATIVE mg/dL   Hgb urine dipstick NEGATIVE  NEGATIVE   Bilirubin Urine NEGATIVE  NEGATIVE   Ketones, ur NEGATIVE  NEGATIVE mg/dL   Protein, ur NEGATIVE  NEGATIVE mg/dL   Urobilinogen, UA 0.2  0.0 - 1.0 mg/dL   Nitrite NEGATIVE  NEGATIVE   Leukocytes, UA NEGATIVE  NEGATIVE  URINE RAPID DRUG SCREEN (HOSP PERFORMED)      Component Value Range   Opiates NONE DETECTED  NONE DETECTED   Cocaine NONE DETECTED  NONE DETECTED  Benzodiazepines NONE DETECTED  NONE DETECTED   Amphetamines NONE  DETECTED  NONE DETECTED   Tetrahydrocannabinol NONE DETECTED  NONE DETECTED   Barbiturates NONE DETECTED  NONE DETECTED     1. Depression   2. Alcohol intoxication   3. Anxiety       MDM  Depression with exacerbation due to medication noncompliance. Chronic anxiety. Alcohol abuse. Screening labs have been obtained and he will be kept in the emergency department for evaluation by crisis management.  I personally performed the services described in this documentation, which was scribed in my presence. The recorded information has been reviewed and considered.          Ian Booze, MD 07/03/11 [phone]

## 2011-07-03 NOTE — ED Notes (Signed)
Misty Stanley from [employer] is here.

## 2011-07-04 ENCOUNTER — Inpatient Hospital Stay (HOSPITAL_COMMUNITY)
Admission: AD | Admit: 2011-07-04 | Discharge: 2011-07-11 | DRG: 897 | Disposition: A | Discharge status: Home / Self Care | Payer: Federal, State, Local not specified - Other | Source: Ambulatory Visit | Attending: Psychiatry | Admitting: Psychiatry

## 2011-07-04 ENCOUNTER — Encounter (HOSPITAL_COMMUNITY): Payer: Self-pay | Admitting: *Deleted

## 2011-07-04 DIAGNOSIS — F411 Generalized anxiety disorder: Secondary | ICD-10-CM | POA: Diagnosis present

## 2011-07-04 DIAGNOSIS — Z79899 Other long term (current) drug therapy: Secondary | ICD-10-CM

## 2011-07-04 DIAGNOSIS — F10239 Alcohol dependence with withdrawal, unspecified: Principal | ICD-10-CM | POA: Diagnosis present

## 2011-07-04 DIAGNOSIS — F1020 Alcohol dependence, uncomplicated: Secondary | ICD-10-CM | POA: Diagnosis present

## 2011-07-04 DIAGNOSIS — R109 Unspecified abdominal pain: Secondary | ICD-10-CM | POA: Diagnosis present

## 2011-07-04 DIAGNOSIS — F10939 Alcohol use, unspecified with withdrawal, unspecified: Principal | ICD-10-CM | POA: Diagnosis present

## 2011-07-04 MED ORDER — HYDROXYZINE HCL 25 MG PO TABS
25.0000 mg | ORAL_TABLET | Freq: Four times a day (QID) | ORAL | Status: DC | PRN
  Administered 2011-07-04: 25 mg via ORAL

## 2011-07-04 MED ORDER — VITAMIN B-1 100 MG PO TABS
100.0000 mg | ORAL_TABLET | Freq: Every day | ORAL | Status: DC
  Administered 2011-07-05 – 2011-07-11 (×7): 100 mg via ORAL
  Filled 2011-07-04 (×9): qty 1

## 2011-07-04 MED ORDER — CHLORDIAZEPOXIDE HCL 25 MG PO CAPS
50.0000 mg | ORAL_CAPSULE | Freq: Once | ORAL | Status: AC
  Administered 2011-07-04: 50 mg via ORAL
  Filled 2011-07-04: qty 2

## 2011-07-04 MED ORDER — CHLORDIAZEPOXIDE HCL 25 MG PO CAPS
25.0000 mg | ORAL_CAPSULE | Freq: Every day | ORAL | Status: AC
  Administered 2011-07-08: 25 mg via ORAL
  Filled 2011-07-04: qty 1

## 2011-07-04 MED ORDER — ALUM & MAG HYDROXIDE-SIMETH 200-200-20 MG/5ML PO SUSP: 30.0000 mL | ORAL | Status: DC | PRN

## 2011-07-04 MED ORDER — ADULT MULTIVITAMIN W/MINERALS CH
1.0000 | ORAL_TABLET | Freq: Every day | ORAL | Status: DC
  Administered 2011-07-04 – 2011-07-11 (×8): 1 via ORAL
  Filled 2011-07-04 (×10): qty 1

## 2011-07-04 MED ORDER — CHLORDIAZEPOXIDE HCL 25 MG PO CAPS
25.0000 mg | ORAL_CAPSULE | Freq: Three times a day (TID) | ORAL | Status: AC
  Administered 2011-07-06 (×3): 25 mg via ORAL
  Filled 2011-07-04 (×3): qty 1

## 2011-07-04 MED ORDER — ACETAMINOPHEN 325 MG PO TABS: 650.0000 mg | ORAL_TABLET | Freq: Four times a day (QID) | ORAL | Status: DC | PRN

## 2011-07-04 MED ORDER — ONDANSETRON 4 MG PO TBDP: 4.0000 mg | ORAL_TABLET | Freq: Four times a day (QID) | ORAL | Status: DC | PRN

## 2011-07-04 MED ORDER — CHLORDIAZEPOXIDE HCL 25 MG PO CAPS
25.0000 mg | ORAL_CAPSULE | Freq: Four times a day (QID) | ORAL | Status: AC
  Administered 2011-07-04 – 2011-07-05 (×6): 25 mg via ORAL
  Filled 2011-07-04 (×6): qty 1

## 2011-07-04 MED ORDER — LOPERAMIDE HCL 2 MG PO CAPS: 2.0000 mg | ORAL_CAPSULE | ORAL | Status: AC | PRN

## 2011-07-04 MED ORDER — ALBUTEROL SULFATE HFA 108 (90 BASE) MCG/ACT IN AERS: 2.0000 | INHALATION_SPRAY | Freq: Four times a day (QID) | RESPIRATORY_TRACT | Status: DC | PRN

## 2011-07-04 MED ORDER — CHLORDIAZEPOXIDE HCL 25 MG PO CAPS
25.0000 mg | ORAL_CAPSULE | ORAL | Status: AC
  Administered 2011-07-07 (×2): 25 mg via ORAL
  Filled 2011-07-04 (×2): qty 1

## 2011-07-04 MED ORDER — MAGNESIUM HYDROXIDE 400 MG/5ML PO SUSP: 30.0000 mL | Freq: Every day | ORAL | Status: DC | PRN

## 2011-07-04 MED ORDER — THIAMINE HCL 100 MG/ML IJ SOLN
100.0000 mg | Freq: Once | INTRAMUSCULAR | Status: AC
  Administered 2011-07-04: 100 mg via INTRAMUSCULAR

## 2011-07-04 MED ORDER — CHLORDIAZEPOXIDE HCL 25 MG PO CAPS: 25.0000 mg | ORAL_CAPSULE | Freq: Four times a day (QID) | ORAL | Status: DC | PRN

## 2011-07-04 NOTE — ED Notes (Signed)
Patient is resting comfortably. Awaiting placement

## 2011-07-04 NOTE — Progress Notes (Signed)
Patient ID: Ian [name], male   DOB: 1980/04/01, 31 y.o.   MRN: [id number] 31yo male voluntary admission. Requesting detox from liquor a 5th every 2 to 3 days. He reports anxiety and depression. He was  Going to drink himself to death. He reported that he had been in  [facility] 3 times this  This year and had been discharged from their. He was alert verbal on admission CWIA was 2. d/t anxiety. Shown about unit and to his room.

## 2011-07-04 NOTE — Progress Notes (Signed)
Recreation Therapy Notes  07/04/2011         Time: 1415      Group Topic/Focus: The focus of this group is on discussing various styles of communication and communicating assertively using 'I' (feeling) statements.   Participation Level: Active  Participation Quality: Attentive  Affect: Blunted  Cognitive: Oriented  Additional Comments: Patient late to group, participated with encouragement.   Shanira Tine 07/04/2011 3:16 PM

## 2011-07-04 NOTE — Progress Notes (Addendum)
D: Patient depressed and worried about loosing everything.  He is unsure about his plans when he is discharged which is worrying him. He states he never knows what his next move will be.    Patient states he lives with parents but when he drinks it puts a major strain on their relationship. Patient states, "I am afraid I will lose everything." Patient denies SI/HI. A: Patient interacting with in the milieu.  Patient offered encouragement and support.  Patient given medications and medication administered with no problem.  Staff to monitor Q 15 min for safety   R: Patient cooperative and patient remains safe

## 2011-07-04 NOTE — ED Notes (Signed)
[facility] leaving dept.  State that hopefully pt will have placement by noon.  They will provide transportation for pt.  She will call if any additional studies are needed prior to admission.

## 2011-07-04 NOTE — Progress Notes (Signed)
8:27 AM Pt sound asleep.  He is awaiting placement for detox, depression.  Pt transferred to [facility] by [facility].

## 2011-07-04 NOTE — BH Assessment (Signed)
Assessment Note   Ian [name] is an 31 y.o. male. PT REPORTED TO THE [facility] WITH ALCOHOL DEPENDENCY AND DEPRESSION DUE TO CONFLICT WITH HIS EX-WIFE AND CUSTODY ISSUES. PT REPORTS BINGE DRINKING X 6 MONTHS WITH LAST DRINK ON 07/03/11. PT REPORTS HE SOMETIMES HAS SUICIDAL THOUGHTS WHEN DRINKING BUT NO PLAN NOR INTENT. PT DENIES CURRENT S/I, H/I AND IS NOT PSYCHOTIC. PT IS CURRENTLY RESIDING WITH  HIS MOTHER AND STEPFATHER AND HAS ONE SON WHO LIVES WITH HIS MOTHER. PT ETOH WAS 329  UPON ARRIVAL TO THE ED.       Axis I: Alcohol Abuse, MAJOR DEPRESSION Axis II: Deferred Axis III:  Past Medical History  Diagnosis Date  . Anxiety   . Depression   . Hypertension    Axis IV: housing problems, other psychosocial or environmental problems and problems with primary support group Axis V: 31-40 impairment in reality testing       Past Medical History:  Past Medical History  Diagnosis Date  . Anxiety   . Depression   . Hypertension     No past surgical history on file.  Family History: No family history on file.  Social History:  reports that he has been smoking.  He does not have any smokeless tobacco history on file. He reports that he drinks alcohol. He reports that he does not use illicit drugs.  Additional Social History:  Alcohol / Drug Use History of alcohol / drug use?: Yes Substance #1 Name of Substance 1: ALCOHOL 1 - Age of First Use: 16 1 - Amount (size/oz): 1 PT PLUS 1 - Frequency: DAILY 1 - Duration: 6 MONTHS 1 - Last Use / Amount: 07/03/11 - FIFTH OF LIQUOR @ 8:30  CIWA: CIWA-Ar BP: 132/84 mmHg Pulse Rate: 115  Nausea and Vomiting: mild nausea with no vomiting Tactile Disturbances: none Tremor: not visible, but can be felt fingertip to fingertip Auditory Disturbances: not present Paroxysmal Sweats: barely perceptible sweating, palms moist Visual Disturbances: not present Anxiety: two Headache, Fullness in Head: very mild Agitation:  two Orientation and Clouding of Sensorium: oriented and can do serial additions CIWA-Ar Total: 8  COWS:    Allergies: No Known Allergies  Home Medications:  (Not in a hospital admission)  OB/GYN Status:  No LMP for male patient.  General Assessment Data Location of Assessment: [facility] Assessment Services Living Arrangements: Parent Can pt return to current living arrangement?: Yes Admission Status: Voluntary Is patient capable of signing voluntary admission?: Yes Transfer from: Acute Hospital Referral Source: MD (Ian DAVIDSON)  Education Status Contact person: Ian [name] [phone]  Risk to self Suicidal Ideation: No Suicidal Plan?: No Access to Means: No What has been your use of drugs/alcohol within the last 12 months?: ALCOHOL Previous Attempts/Gestures: No How many times?: 0  Other Self Harm Risks: 0 Triggers for Past Attempts: None known Intentional Self Injurious Behavior: None Family Suicide History: No Persecutory voices/beliefs?: No Depression: Yes Depression Symptoms: Isolating;Tearfulness;Loss of interest in usual pleasures;Feeling worthless/self pity;Feeling angry/irritable;Despondent Substance abuse history and/or treatment for substance abuse?: Yes Suicide prevention information given to non-admitted patients: Not applicable  Risk to Others Homicidal Ideation: No Thoughts of Harm to Others: No Current Homicidal Intent: No Current Homicidal Plan: No Access to Homicidal Means: No History of harm to others?: No Assessment of Violence: None Noted Violent Behavior Description: NA Does patient have access to weapons?: No Criminal Charges Pending?: No Does patient have a court date: No  Psychosis Hallucinations: None noted Delusions:  None noted  Mental Status Report Appear/Hygiene: Improved Eye Contact: Good Motor Activity: Freedom of movement Speech: Logical/coherent Level of Consciousness: Alert Mood:  Depressed;Despair;Helpless;Sad Affect: Appropriate to circumstance;Depressed Anxiety Level: Minimal Thought Processes: Coherent;Relevant Judgement: Unimpaired Orientation: Person;Place;Time;Situation Obsessive Compulsive Thoughts/Behaviors: None  Cognitive Functioning Concentration: Normal Memory: Recent Intact;Remote Intact IQ: Average Insight: Poor Impulse Control: Poor Appetite: Fair Sleep: Decreased Total Hours of Sleep: 4  Vegetative Symptoms: None  ADLScreening [facility] Assessment Services) Patient's cognitive ability adequate to safely complete daily activities?: Yes Patient able to express need for assistance with ADLs?: Yes Independently performs ADLs?: Yes  Abuse/Neglect [facility]) Physical Abuse: Denies Verbal Abuse: Denies Sexual Abuse: Denies  Prior Inpatient Therapy Prior Inpatient Therapy: No  Prior Outpatient Therapy Prior Outpatient Therapy: No  ADL Screening (condition at time of admission) Patient's cognitive ability adequate to safely complete daily activities?: Yes Patient able to express need for assistance with ADLs?: Yes Independently performs ADLs?: Yes Weakness of Legs: None Weakness of Arms/Hands: None  Home Assistive Devices/Equipment Home Assistive Devices/Equipment: None  Therapy Consults (therapy consults require a physician order) PT Evaluation Needed: No OT Evalulation Needed: No SLP Evaluation Needed: No Abuse/Neglect Assessment (Assessment to be complete while patient is alone) Physical Abuse: Denies Verbal Abuse: Denies Sexual Abuse: Denies Exploitation of patient/patient's resources: Denies Self-Neglect: Denies Values / Beliefs Cultural Requests During Hospitalization: None Spiritual Requests During Hospitalization: None Consults Spiritual Care Consult Needed: No Social Work Consult Needed: No      Additional Information 1:1 In Past 12 Months?: No CIRT Risk: No Elopement Risk: No Does patient have medical clearance?:  Yes     Disposition: PT ACCEPTED TO [facility] BY Ian [name] TO Ian [name] ROOM 306-1   Disposition Disposition of Patient: Inpatient treatment program Type of inpatient treatment program: Adult  On Site Evaluation by:   Reviewed with Physician:     Ian [name] 07/04/2011 11:52 AM

## 2011-07-05 DIAGNOSIS — F1020 Alcohol dependence, uncomplicated: Secondary | ICD-10-CM

## 2011-07-05 MED ORDER — MAGNESIUM HYDROXIDE 400 MG/5ML PO SUSP: 30.0000 mL | Freq: Every day | ORAL | Status: DC | PRN

## 2011-07-05 MED ORDER — ACETAMINOPHEN 325 MG PO TABS
650.0000 mg | ORAL_TABLET | Freq: Four times a day (QID) | ORAL | Status: DC | PRN
  Administered 2011-07-07 – 2011-07-11 (×4): 650 mg via ORAL

## 2011-07-05 MED ORDER — TRAZODONE HCL 50 MG PO TABS
50.0000 mg | ORAL_TABLET | Freq: Every day | ORAL | Status: DC
  Administered 2011-07-05 – 2011-07-10 (×6): 50 mg via ORAL
  Filled 2011-07-05 (×9): qty 1

## 2011-07-05 MED ORDER — CITALOPRAM HYDROBROMIDE 20 MG PO TABS
20.0000 mg | ORAL_TABLET | Freq: Every day | ORAL | Status: DC
  Administered 2011-07-05 – 2011-07-07 (×3): 20 mg via ORAL
  Filled 2011-07-05 (×6): qty 1

## 2011-07-05 MED ORDER — ALUM & MAG HYDROXIDE-SIMETH 200-200-20 MG/5ML PO SUSP: 30.0000 mL | ORAL | Status: DC | PRN

## 2011-07-05 NOTE — H&P (Signed)
Pt seen and evaluated upon admission.  Completed Admission Suicide Risk Assessment.  See orders.  Pt agreeable with plan.  Discussed with team.   

## 2011-07-05 NOTE — Progress Notes (Addendum)
D) Patient pleasant and cooperative upon my assessment. Patient states slept "poor," and  appetite is "poor ." Patient rates depression as  8 /10, patient rates hopeless feelings as  8/10. Patient denies SI/HI, denies A/V hallucinations. Patient states he feels "depressed even more" today. Patient has a plan to "medicate for depression/anxiety" once he is discharged from hospital.  A) Patient offered support and encouragement. Patient educated about plan of care.   R)  Patient verbalizes understanding of care plan. Patient active on unit, attending groups and meals.  Patient remains safe on unit with Q15 minute checks for safety. Will continue to monitor.

## 2011-07-05 NOTE — BHH Counselor (Signed)
Adult Comprehensive Assessment  Patient ID: Ian [name], male   DOB: 24-Aug-1980, 31 y.o.   MRN: [id number]  Information Source: Information source: Patient  Current Stressors:  Educational / Learning stressors: NA Employment / Job issues: Unemployeed Family Relationships: Strained at best due to pt's drinking Surveyor, quantity / Lack of resources (include bankruptcy): No income, no resources Housing / Lack of housing: Lost home due to financial situation, currently living w M and [employer] Physical health (include injuries & life threatening diseases): HTN, Anxiety and Depression Social relationships: Isolates Substance abuse: History and current use Bereavement / Loss: "Hard losing home"  Living/Environment/Situation:  Living Arrangements: Parent Living conditions (as described by patient or guardian): Pt reports living w M and SF  How long has patient lived in current situation?: 6 months What is atmosphere in current home: Other (Comment) ("Hostile")  Family History:  Marital status: Single Does patient have children?: Yes How many children?: 1  How is patient's relationship with their children?: Patient reports good relationship w 37 YO son  Childhood History:  By whom was/is the patient raised?: Both parents Additional childhood history information: Parents divorced at pt's age 65 Description of patient's relationship with caregiver when they were a child: "fine" Patient's description of current relationship with people who raised him/her: Srained with mother, father and step father Does patient have siblings?: Yes Number of Siblings: 1  Description of patient's current relationship with siblings: "Not great with older sister" Did patient suffer any verbal/emotional/physical/sexual abuse as a child?: No Did patient suffer from severe childhood neglect?: No Has patient ever been sexually abused/assaulted/raped as an adolescent or adult?: No Was the patient ever a victim of a crime or  a disaster?: No Witnessed domestic violence?: No Has patient been effected by domestic violence as an adult?: No  Education:  Highest grade of school patient has completed: 16 Currently a student?: No Contact person: [name]-STEP FATHER [phone] Learning disability?: No  Employment/Work Situation:   Employment situation: Unemployed Patient's job has been impacted by current illness: Yes Describe how patient's job has been implacted: Unable to keep most recent job in [employer] due to increased drinking What is the longest time patient has a held a job?: 1.5 years Where was the patient employed at that time?: [employer] Has patient ever been in the [employer]?: No Has patient ever served in Buyer, retail?: No  Financial Resources:   Financial resources: No income  Alcohol/Substance Abuse:   What has been your use of drugs/alcohol within the last 12 months?: Alcohol 1 pint to 1 fifth daily for 6 months; periodic use of prescribed Zoloft 6-8 months and Xanax (1/2 to 1 pill per day) If attempted suicide, did drugs/alcohol play a role in this?:  (No attempt) Alcohol/Substance Abuse Treatment Hx: Past Tx, Inpatient If yes, describe treatment: [facility], Jan 2012 [facility] and 4/13 [facility] Has alcohol/substance abuse ever caused legal problems?: Yes  Social Support System:   Patient's Community Support System: Fair Describe Community Support System: Family Type of faith/religion: Grew up Christain How does patient's faith help to cope with current illness?: "very involved in high school; but have slipped away"  Leisure/Recreation:   Leisure and Hobbies: "nothing; simple things like going to grocery are too difficult"  Strengths/Needs:   What things does the patient do well?: "nothing" In what areas does patient struggle / problems for patient: "Depression and anxiety"  Discharge Plan:   Does patient have access to transportation?:   (Uncertain  if parents picked up vehicle [facility]) Will patient be returning to same living situation after discharge?:  (Uncertain) Currently receiving community mental health services: Yes (From Whom) (Has July 3 appt [facility] in [place]) Does patient have financial barriers related to discharge medications?: No  Summary/Recommendations:   Summary and Recommendations (to be completed by the evaluator): Pt is 31 YO single unemployeed caucasian male admitted with diagnosis of Alcohol Abuse and Major Depression.  Patient reports losing home he and 24 YO son shared due to financial difficulties and alcohol use; currently living with M and SF.  Patient will benefit from crisis stabilization, medication evaluation, group therapy and psychoeducation  in addition to case management for discharge planning.   Clide Dales. 07/05/2011

## 2011-07-05 NOTE — Progress Notes (Signed)
D: Patient complains of depression and anxiety tonight.  Patient denies pain.  Patient smiled today and patient states, "I am slowly getting better."  Patient interacting within the milieu. Patient interacted during group tonight.  Patient states, " I cannot believe I was drinking as much as I was drinking."  Patient denies SI/HI and denies AVH. A: Patient offered encouragement and support. Patient scheduled medications given Staff to monitor Q 15 min for safety R: Patient cooperative and pleasant. Patient compliant with all medications.  Patient remains safe.

## 2011-07-05 NOTE — Progress Notes (Signed)
Nutrition Note  Pt triggered on Health Hx report due to unintentional weight loss per pt of 20# in the past 2 weeks.  Reports he was working out in the hot sun a lot and not eating as well.    Ht:  5'10" Wt:  197# BMI:  27  Regular diet.  Reports that he does not eat well.   Eating fair currently.   Admitted for ETOH abuse, depression, anxiety.  Encouraged improved intake.  Please consult if needed.  Oran Rein, RD [phone]

## 2011-07-05 NOTE — Progress Notes (Signed)
Pt attended discharge planning group and actively participated.  Pt presents with tearful affect and depressed mood.  Pt was open with sharing reason for entering the hospital.  Pt states that he came here for depression, anxiety and alcohol detox.  Pt states that he's been drinking for 10-15 years but more heavily lately to cope with the depression.  Pt states that he went to [facility] 2 years ago but continued to drink.  Pt states that he drinks when he has to deal with his child's mother.  Pt states that she won't let him visit their son unless he's at his parents.  Pt states that he was living with his parents in [place] but is unsure if he is able to return there.  Pt states that he doesn't work or have income. Pt doesn't want to go to an [employer].  Pt states that he doesn't want his family to know he is here or got help, and therefore wants rehab that is a week long.  Pt explained that his [place] is at the beach for the next [place] and wants help while they are gone.  SW explained that his only option would be [facility] or detox here.  Pt is considering [facility] as an option.  Pt asked if he can see Jorje Guild, PA.  SW explained that he takes insurance.  Pt asked how much he would be for self pay.  SW asked pt why he wants Jorje Guild as his provider and pt states he heard good things about him and saw him on tv.  Pt was tearful throughout this interaction.  Pt states that he wants help with the depression and wants to stay on his meds this time.  Pt rates depression and anxiety at a 10 today.  Pt denies SI.  SW will assess for appropriate referrals. No further needs voiced by pt at this time.    Ian [name], LCSWA 07/05/2011  2:18 PM

## 2011-07-05 NOTE — H&P (Signed)
Psychiatric Admission Assessment Adult  Patient Identification:  Ian [name] Date of Evaluation:  07/05/2011 Chief Complaint:  Alcohol Dependence History of Present Illness:  The patient presented to ED and requested assistance with detoxing off of alcohol.  He states he has been drinking 1/5th of liquor a day.  He reports that he has previous detoxes at [facility] 1 1/2 years ago, and 2 years ago at [facility].  He denies previous suicide attempts, or SI/HI.  He reports decreased sleep, poor appetite, and significant depression of 8-9/10.  He notes that he has had a previous episode of auditory and visual hallucinations recently while sobering up from a rather hard binge where he reports consuming 1/2 gallon a day of liquor.  Past Psychiatric History: Diagnosis:  Hospitalizations:  [facility]  Outpatient Care:  Substance Abuse Care:  Self-Mutilation:  Suicidal Attempts: 0  Violent Behaviors: 0   Past Medical History:   Past Medical History  Diagnosis  Hypertension Date   Allergies:  No Known Allergies PTA Medications: Prescriptions prior to admission  Medication Sig Dispense Refill  . albuterol (PROVENTIL HFA;VENTOLIN HFA) 108 (90 BASE) MCG/ACT inhaler Inhale 2 puffs into the lungs every 6 (six) hours as needed. For shortness of breath      . ALPRAZolam (XANAX) 0.25 MG tablet Take 0.125-0.25 mg by mouth See admin instructions. Twice daily as needed for anxiety      . ibuprofen (ADVIL,MOTRIN) 400 MG tablet Take 400 mg by mouth every 6 (six) hours as needed. For pain relief       Previous Psychotropic Medications:  Medication/Dose   Zoloft   xanax   Anafranil   Substance Abuse History in the last 12 months:  Denies Substance Age of 1st Use Last Use Amount Specific Type  Nicotine      Alcohol      Cannabis      Opiates      Cocaine      Methamphetamines      LSD      Ecstasy      Benzodiazepines      Caffeine      Inhalants      Others:                           Consequences of Substance Abuse: Blackouts:    Social History: Current Place of Residence:   Place of Birth:   Family Members: Marital Status:  Single Children:  Sons:  Daughters: Relationships: Education:  Corporate treasurer Problems/Performance: Religious Beliefs/Practices: History of Abuse (Emotional/Phsycial/Sexual) Occupational Experiences; Military History:  None. Legal History: Hobbies/Interests:  Family History:  History reviewed. No pertinent family history. ROS: Negative with the exception of HPI. PE: Completed by the MD in the ED.  I have reviewed the patient and the results and agree with those findings. Mental Status Examination/Evaluation: Objective:  Appearance: Casual  Eye Contact::  Fair  Speech:  Clear and Coherent  Volume:  Normal  Mood:  Depressed and Irritable  Affect:  Congruent  Thought Process:  Coherent, Goal Directed, Intact, Linear and Logical  Orientation:  Full  Thought Content:  WDL  Suicidal Thoughts:  No  Homicidal Thoughts:  No  Memory:  Immediate;   Fair  Judgement:  Fair  Insight:  Fair  Psychomotor Activity:  Normal  Concentration:  Fair  Recall:  Fair  Akathisia:  No  Handed:    AIMS (if indicated):     Assets:  Communication Skills Desire for Improvement  Sleep:  Number of Hours: 5.25     Laboratory/X-Ray Psychological Evaluation(s)      Assessment:    AXIS I:  Alcohol dependence early withdrawal, SIMDO r/o Bipolar disorder AXIS II:  Deferred AXIS III:   Past Medical History  Diagnosis Date  . Anxiety   . Depression   . Hypertension    AXIS IV:  housing problems, occupational problems, problems related to social environment, problems with access to health care services and problems with primary support group AXIS V:  51-60 moderate symptoms.  Treatment recommendations: 1. Admit for crisis management, stabilization, and detox. 2. Treat all medical problems as indicated. 3. Continue to  monitor. Treatment Plan Summary:  Current Medications:  Current Facility-Administered Medications  Medication Dose Route Frequency Provider Last Rate Last Dose  . acetaminophen (TYLENOL) tablet 650 mg  650 mg Oral Q6H PRN Verne Spurr, PA-C      . alum & mag hydroxide-simeth (MAALOX/MYLANTA) 200-200-20 MG/5ML suspension 30 mL  30 mL Oral Q4H PRN Verne Spurr, PA-C      . chlordiazePOXIDE (LIBRIUM) capsule 25 mg  25 mg Oral Q6H PRN Verne Spurr, PA-C      . chlordiazePOXIDE (LIBRIUM) capsule 25 mg  25 mg Oral QID Verne Spurr, PA-C   25 mg at 07/05/11 1158   Followed by  . chlordiazePOXIDE (LIBRIUM) capsule 25 mg  25 mg Oral TID Verne Spurr, PA-C       Followed by  . chlordiazePOXIDE (LIBRIUM) capsule 25 mg  25 mg Oral BH-qamhs Verne Spurr, PA-C       Followed by  . chlordiazePOXIDE (LIBRIUM) capsule 25 mg  25 mg Oral Daily Verne Spurr, PA-C      . chlordiazePOXIDE (LIBRIUM) capsule 50 mg  50 mg Oral Once Verne Spurr, PA-C   50 mg at 07/04/11 1423  . hydrOXYzine (ATARAX/VISTARIL) tablet 25 mg  25 mg Oral Q6H PRN Verne Spurr, PA-C   25 mg at 07/04/11 2130  . loperamide (IMODIUM) capsule 2-4 mg  2-4 mg Oral PRN Verne Spurr, PA-C      . magnesium hydroxide (MILK OF MAGNESIA) suspension 30 mL  30 mL Oral Daily PRN Verne Spurr, PA-C      . multivitamin with minerals tablet 1 tablet  1 tablet Oral Daily Verne Spurr, PA-C   1 tablet at 07/05/11 0800  . ondansetron (ZOFRAN-ODT) disintegrating tablet 4 mg  4 mg Oral Q6H PRN Verne Spurr, PA-C      . thiamine (B-1) injection 100 mg  100 mg Intramuscular Once [employer], PA-C   100 mg at 07/04/11 1420  . thiamine (VITAMIN B-1) tablet 100 mg  100 mg Oral Daily Verne Spurr, PA-C   100 mg at 07/05/11 2130    Observation Level/Precautions:  Detox  Laboratory:    Psychotherapy:    Medications:    Routine PRN Medications:  Yes  Consultations:    Discharge Concerns:    Other:     Lloyd Huger T. Sherri Mcarthy PAC for Dr. Lupe Carney 6/26/20131:56 PM

## 2011-07-05 NOTE — [facility] Suicide Risk Assessment (Signed)
Suicide Risk Assessment  Admission Assessment      Demographic factors:  See chart.  Current Mental Status:  Patient seen and evaluated. Chart reviewed. Patient stated that his mood was "ok". His affect was mood congruent and anxious. He denied any current thoughts of self injurious behavior, suicidal ideation or homicidal ideation.  Sig anxiety starting at age 31, on Anafranil as a child.  Separation anxiety as well.  There were no auditory or visual hallucinations, paranoia, delusional thought processes, or mania noted.  Thought process was linear and goal directed.  No psychomotor agitation or retardation was noted. His speech was normal rate, tone and volume. Eye contact was good. Judgment and insight are fair.  Patient has been up and engaged on the unit.  No safety concerns reported from team.  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  Loss Factors: separation from GF; unemployment; recent separation from son   Historical Factors: employment loss; hx on Zoloft and Xanax  Risk Reduction Factors: Responsible for children under 30 years of age;Sense of responsibility to family;Religious beliefs about death;Living with another person, especially a relative; 31 y/o; back and forth from parents home; working in [employer] and staying in hotel  CLINICAL FACTORS: Alcohol Dependence & W/D; GAD; SIMD  COGNITIVE FEATURES THAT CONTRIBUTE TO RISK: none.  SUICIDE RISK: Pt viewed as a chronic moderate increased risk of harm to self in light of his past hx and risk factors.  No acute safety concerns noted on the unit.  Pt contracting for safety and in need of crisis stabilization & Tx.  PLAN OF CARE: Pt admitted for crisis stabilization, detox off alcohol and treatment.  Please see orders.  Initiated citalopram for anxiety and depressive s/s. Medications reviewed with pt and medication education provided. Will continue q15 minute checks per unit protocol.  No clinical indication for one on one level of observation at  this time.  Pt contracting for safety.  Mental health treatment, medication management and continued sobriety will mitigate against the increased risk of harm to self and/or others.  Discussed the importance of recovery with pt, as well as, tools to move forward in a healthy & safe manner.  Pt agreeable with the plan.  Discussed with the team.   Lupe Carney 07/05/2011, 2:09 PM

## 2011-07-05 NOTE — Progress Notes (Signed)
[facility] Group Notes:  (Counselor/Nursing/MHT/Case Management/Adjunct)  07/05/2011 4:37 PM  Type of Therapy:  Group Therapy at 11  Participation Level:  Minimal  Participation Quality:  Attentive  Affect:  Anxious  Cognitive:  Alert and Oriented  Insight:  Limited  Engagement in Group:  Limited  Engagement in Therapy:  Limited  Modes of Intervention:  Clarification, Socialization and Support  Summary of Progress/Problems:  Patient attended first group session of stay and shared with group his hopelessness and frustration of "here I am again." Others in group shared that this was "not necessarily their first of known last time asking for help" and "that asking for help is always better than drinking/using your way to the grave." Patient was somewhat withdrawn and sounded hesitant.   [facility] Group Notes:  (Counselor/Nursing/MHT/Case Management/Adjunct)  07/05/2011 4:41 PM  Type of Therapy:  Group Therapy at 1:15  Participation Level:  Minimal  Participation Quality:  Attentive  Affect:  Anxious  Cognitive:  Alert and Oriented  Insight:  Limited  Engagement in Group:  Limited  Engagement in Therapy:  Limited  Modes of Intervention:  Clarification, Socialization and Support  Summary of Progress/Problems:  Patient shared once again his frustration of "here I am again. I cannot get this sobriety thing  right"  Patient was asked several times (with no response) to rephrase statement in more positive way.  Toward end of group facilitator asked Jonathon once again to rephrase.  Patient could not come up with alternative yet was willing to repeat "I have not gotten this sobriety thing right UNTIL now."   "Clide Dales 07/05/2011, 4:46 PM

## 2011-07-06 NOTE — Progress Notes (Signed)
[facility] Adult Inpatient Family/Significant Other Collateral Contact  Collateral Contact:  Sherlynn Carbon (stepfather) at [phone] has been identified by the patient as the family member/significant other who can provide for collateral information. With written consent from the patient, the family member/significant other has been contacted and reports:   That patient will not be able to return to their home as patient has been noncompliant with rules.   One option may be for pt to stay w father in [place]  That patient historically relapses within hours or weeks following discharge and family would like to know what possible changes may improve patient's odds  Step father believes patients suicidal risks are minimal. Writer did provide discussion of risk factors, warning signs and resources to contact for help. Mobile crisis services explained and step father was given the contact number. Contact card placed in chart for pt to receive at discharge.   Clide Dales 07/06/2011 6:59 PM

## 2011-07-06 NOTE — Tx Team (Signed)
Interdisciplinary Treatment Plan Update (Adult)  Date:  07/06/2011  Time Reviewed:  9:58 AM   Progress in Treatment: Attending groups: Yes Participating in groups:  Yes Taking medication as prescribed: Yes Tolerating medication:  Yes Family/Significant othe contact made:  Counselor is assessing for appropriate contact Patient understands diagnosis:  Yes Discussing patient identified problems/goals with staff:  Yes Medical problems stabilized or resolved:  Yes Denies suicidal/homicidal ideation: Yes Issues/concerns per patient self-inventory:  None identified Other: N/A  New problem(s) identified: None Identified  Reason for Continuation of Hospitalization: Anxiety Depression Medication stabilization Withdrawal symptoms  Interventions implemented related to continuation of hospitalization: mood stabilization, medication monitoring and adjustment, group therapy and psycho education, safety checks q 15 mins  Additional comments: N/A  Estimated length of stay: 3-5 days  Discharge Plan: SW is assessing for appropriate referrals.   New goal(s): N/A  Review of initial/current patient goals per problem list:    1.  Goal(s): Address substance use  Met:  No  Target date: by discharge  As evidenced by: completing detox protocol and refer to appropriate treatment  2.  Goal (s): Reduce depressive and anxiety symptoms  Met:  No  Target date: by discharge  As evidenced by: Reducing depression from a 10 to a 3 as reported by pt.     Attendees: Patient: Ian [name] 07/06/2011 10:15 AM   Family:     Physician:  Lupe Carney, DO 07/06/2011 9:58 AM   Nursing: Barrie Folk, RN 07/06/2011 9:58 AM   Case Manager:  Reyes Ivan, LCSWA 07/06/2011  9:58 AM   Counselor:  Ronda Fairly, LCSWA 07/06/2011  9:58 AM   Other:  Clarice Pole, LCASA 07/06/2011 9:58 AM   Other:  Chinita Greenland, RN 07/06/2011 9:59 AM   Other:     Other:      Scribe for Treatment Team:     Reyes Ivan 07/06/2011 9:58 AM

## 2011-07-06 NOTE — Progress Notes (Signed)
(  D)Patient presents with flat, sad affect and minimal eye contact. Patient reports sleeping after taking sleep aid last night. States appetite is improving, energy level low, ability to pay attention poor and rates both depression and hopelessness as 8/10. Withdrawal symptoms reported include diarrhea, cravings, agitation and chilling. Denies SI/HI. Reports headaches that come "come and go". Denies pain at present. (A) Encouraged patient to attend all groups and to make a list of coping kills that he has learned so far for cravings. (R) Patient cooperative and pleasant. Joice Lofts RN MS EdS 07/06/2011  9:01 AM

## 2011-07-06 NOTE — Progress Notes (Signed)
Patient ID: Ian [name], male   DOB: [birth year], 31 y.o.   MRN: [id number] D: Pt is awake and active on the unit this AM. Pt is tearful and depressed this afternoon. Writer spoke with pt mother on the phone. We discussed the pt hx and present condition. She stated that her family has provided numerous opportunities for the pt to get help by rehabilitation, therapy and medication but he never stays sober. Pt's mother is frustrated with his lack of progress. Writer encouraged her to allow the pt to face the consequences of his behavior and actions.  A: Writer offered self to and encouraged pt to continue his recovery in AA after discharge. Medications administered. Writer will continue to monitor.   R: Pt denies SI/HI and A/V hallucinations. Pt is participating in the milieu and is cooperative with staff.  Pt acknowledges that he is an alcoholic and is receptive to staff input.

## 2011-07-06 NOTE — Progress Notes (Signed)
Pt attended discharge planning group and actively participated.  Pt presents with flat affect and depressed mood.  Pt rates depression and anxiety at an 8 today.  Pt denies SI.  Pt states that he is still unsure of any d/c plans.  SW discussed [facility] again as a good option for pt.  SW provided pt with a brochure about [facility] for pt to review today.  SW will continue to assess for appropriate referrals.  No further needs voiced by pt at this time.  Safety planning and suicide prevention discussed.     Reyes Ivan, LCSWA 07/06/2011  9:35 AM

## 2011-07-06 NOTE — Progress Notes (Signed)
[facility] Group Notes:  (Counselor/Nursing/MHT/Case Management/Adjunct)  07/06/2011 3:58 PM  Type of Therapy:  Group Therapy at 11  Participation Level:  Active  Participation Quality:  Appropriate, Attentive and Sharing  Affect:  Anxious  Cognitive:  Alert and Oriented  Insight:  Limited  Engagement in Group:  Good  Engagement in Therapy:  Limited  Modes of Intervention:  Clarification, Socialization and Support  Summary of Progress/Problems: Focus of group processing discussion was on balance in life; the components in life which have a negative influence on balance and the components which make for a more balanced life.  Patient shared his anxiety and  impulses are  negative forces in his life along with the judgement he gets from family.  "I don't know why they won't just leave me alone and let me have a couple of glasses of wine." Writer asked when was last time patient was able to stop at a couple of glasses of wine, to which patient answered never and others laughed with identification. Group discussion on inability to control preceded.    Clide Dales 07/06/2011, 3:58 PM[facility] Group Notes:  (Counselor/Nursing/MHT/Case Management/Adjunct)  07/06/2011 3:58 PM  Type of Therapy:  Group Therapy at 1:15  Participation Level:  Active  Participation Quality:  Appropriate  Affect:  Anxious  Cognitive:  Alert and Oriented  Insight:  Good  Engagement in Group:  Good  Engagement in Therapy:  Limited  Modes of Intervention:  Activity, Clarification and Support  Summary of Progress/Problems: participated in activity in which patients choose photographs to represent what their life would look and feel like were it in balance and another for out of balance. Patient chose photo of a bottle of wine to represent out of balance and shared "this kind of says it all." Patient chose photo of airplane to represent balance as "this bottle of wine has held be back from taking my life  to bigger places."   Clide Dales 07/06/2011, 3:58 PM

## 2011-07-06 NOTE — Progress Notes (Signed)
[facility] MD Progress Note   07/06/2011 2:03 PM  S/O: Patient seen and evaluated. Chart reviewed. Patient stated that his mood was "allright".  Slept "good" last night.  His affect was mood congruent and still anxious. He denied any current thoughts of self injurious behavior, suicidal ideation or homicidal ideation. Sig anxiety starting at age 31, on Anafranil as a child. Separation anxiety as well. There were no auditory or visual hallucinations, paranoia, delusional thought processes, or mania noted. Thought process was linear and goal directed. No psychomotor agitation or retardation was noted. His speech was normal rate, tone and volume. Eye contact was good. Judgment and insight are fair. Patient has been up and engaged on the unit. No acute safety concerns reported from team. Discussed behavioral changes necessary to help pt move forward in his recovery.   Sleep:  Number of Hours: 6.75    Vital Signs:Blood pressure 121/87, pulse 79, temperature 97.9 F (36.6 C), temperature source Oral, resp. rate 16, height 5\' 10"  (1.778 m), weight 89.359 kg (197 lb).  Current Medications:    . chlordiazePOXIDE  25 mg Oral QID   Followed by  . chlordiazePOXIDE  25 mg Oral TID   Followed by  . chlordiazePOXIDE  25 mg Oral BH-qamhs   Followed by  . chlordiazePOXIDE  25 mg Oral Daily  . citalopram  20 mg Oral Daily  . multivitamin with minerals  1 tablet Oral Daily  . thiamine  100 mg Oral Daily  . traZODone  50 mg Oral QHS    Lab Results: No results found for this or any previous visit (from the past 48 hour(s)).  Physical Findings: CIWA:  CIWA-Ar Total: 6   A/P: Alcohol Dependence & W/D; GAD; SIMD   Pt seen and evaluated in treatment team.  Reviewed short term and long term goals, medications, current treatment in the hospital and acute/chronic safety.  Pt denied any current thoughts of self harm, suicidal ideation or homicidal ideation.  Contracted for safety on the unit.  No acute issues noted.  VS  reviewed with team.  Pt agreeable with treatment plan, see orders. Continue current medications as noted above.  Lupe Carney 07/06/2011, 2:03 PM

## 2011-07-06 NOTE — Progress Notes (Signed)
Psychoeducational Group Note  Date:  07/05/2011  Time:  2200  Group Topic/Focus:  Wrap-Up Group:   The focus of this group is to help patients review their daily goal of treatment and discuss progress on daily workbooks.  Participation Level:  Active  Participation Quality:  Appropriate and Attentive  Affect:  Appropriate  Cognitive:  Appropriate  Insight:  Good  Engagement in Group:  Good  Additional Comments:  Pt attended NA group this evening.  Pt participated with serenity prayer and readings of NA group.  Aundria Rud, Emillio Ngo L 07/06/2011, 12:16 AM

## 2011-07-07 MED ORDER — QUETIAPINE FUMARATE 25 MG PO TABS
25.0000 mg | ORAL_TABLET | Freq: Two times a day (BID) | ORAL | Status: DC
  Administered 2011-07-07 – 2011-07-11 (×9): 25 mg via ORAL
  Filled 2011-07-07 (×13): qty 1

## 2011-07-07 MED ORDER — CITALOPRAM HYDROBROMIDE 20 MG PO TABS
30.0000 mg | ORAL_TABLET | Freq: Every day | ORAL | Status: DC
  Administered 2011-07-08 – 2011-07-11 (×4): 30 mg via ORAL
  Filled 2011-07-07 (×6): qty 1

## 2011-07-07 NOTE — Progress Notes (Signed)
Pt attended discharge planning group and actively participated.  Pt presents with flat affect and depressed mood.  Pt rates depression and anxiety at a 8-9 today.  Pt denies SI.  Pt states that he got a call from his mom last night which set him back and made him more depressed.  Pt states that his mom told him his son had an emotional breakdown about missing pt.  Pt was tearful when talking about this. Pt is still unsure about his d/c plan, stating he is still not clear-headed.  Pt states that he plans to stay in the dayroom and not in his room as he worries and thinks too much there.  Follow up is still unknown.  No further needs voiced by pt at this time.    Ian [name], LCSWA 07/07/2011  9:30 AM

## 2011-07-07 NOTE — Progress Notes (Signed)
D-Pt is out in milieu interacting with peers and attending groups. C/O chronic anxiety and coping skills suggested. A-Reports poor sleep and improving appetite. Also reports low energy and poor attention span.. Rated depression and hopelessness at 8.Denies SI.Multiple withdrawal symptoms reported. A-Compliant with scheduled medication protocol. Continue current POC. Support and encouragement given. Continue 15' checks for safety.

## 2011-07-07 NOTE — Progress Notes (Addendum)
D: Pt denies any SI. Pt expressed depression and hopelessness. Mood is labile. Pt at times is elated at other times very anxious and agitated. Affect at times animated at other times anxious. A: Medicated pt for anxiety and agitation. Support and encouragement offered to pt. Q 15 min checks continued for safety. R: Pt receptive. Pt experienced noticeable improvement in mood and affect.  Pt remains safe on unit.

## 2011-07-07 NOTE — Progress Notes (Signed)
Lying quietly in bed with eyes closed.  Safety maintained via Q 15 minute safety checks.  No signs of acute withdrawal.

## 2011-07-07 NOTE — Progress Notes (Signed)
[facility] MD Progress Note   07/07/2011 2:42 PM  S/O: Patient seen and evaluated. Chart reviewed. Patient stated that his mood was "not good".  Slept "ok" last night.  His affect was mood congruent and still anxious. Increased crying spells. He denied any current thoughts of self injurious behavior, suicidal ideation or homicidal ideation. Sig anxiety starting at age 31, on Anafranil as a child. Separation anxiety as well from son with sig guilt and shame regarding his illness. There were no auditory or visual hallucinations, paranoia, delusional thought processes, or mania noted. Thought process was linear and goal directed. No psychomotor agitation or retardation was noted. His speech was normal rate, tone and volume. Eye contact was good. Judgment and insight are fair. Patient has been up and engaged on the unit. No acute safety concerns reported from team. Discussed behavioral changes necessary to help pt move forward in his recovery.   Sleep:  Number of Hours: 6.75    Vital Signs:Blood pressure 129/90, pulse 102, temperature 98.1 F (36.7 C), temperature source Oral, resp. rate 16, height 5\' 10"  (1.778 m), weight 89.359 kg (197 lb).  Current Medications:    . chlordiazePOXIDE  25 mg Oral TID   Followed by  . chlordiazePOXIDE  25 mg Oral BH-qamhs   Followed by  . chlordiazePOXIDE  25 mg Oral Daily  . citalopram  20 mg Oral Daily  . multivitamin with minerals  1 tablet Oral Daily  . thiamine  100 mg Oral Daily  . traZODone  50 mg Oral QHS    Lab Results: No results found for this or any previous visit (from the past 48 hour(s)).  Physical Findings: CIWA:  CIWA-Ar Total: 2   A/P: Alcohol Dependence & W/D; GAD; SIMD   Increased citalopram to 30 mg po qd for increase control of anxiety s/s with addition of low dose Seroquel at 25 mg po bid.  Medication education completed.  Pros, cons, risks, potential side effects and benefits were discussed with pt.  Pt agreeable with the plan.  See orders.   Discussed with team.  LOS 2-3 more days.  Lupe Carney 07/07/2011, 2:42 PM

## 2011-07-07 NOTE — Progress Notes (Signed)
Writer shared with patient that step father stated patient will not be able to return to their home due to failure to comply with house rules.  Patient somewhat anxious regarding news although he states it was expected.  Patient will discuss possibilities for alternative housing with weekend case management. '  Ian [name] 07/07/2011 3:50 PM

## 2011-07-07 NOTE — Progress Notes (Signed)
[facility] Group Notes:  (Counselor/Nursing/MHT/Case Management/Adjunct)  07/07/2011 3:42 PM  Type of Therapy:  Group Therapy at 11  Participation Level:  Active  Participation Quality:  Appropriate and Attentive  Affect:  Anxious  Cognitive:  Alert, Appropriate and Oriented  Insight:  Limited  Engagement in Group:  Good  Engagement in Therapy:  Good  Modes of Intervention:  Clarification, Education and Socialization  Summary of Progress/Problems:  was attentive to discussion on Post Acute Withdrawal Syndrome (PAWS).  Lathon shared confusion, which is common, as to how long PAWS actually lasts.  Patient was attentive during educational segment and appeared to identify well with other's comments.   Clide Dales 07/07/2011, 3:45 PM  [facility] Group Notes:  (Counselor/Nursing/MHT/Case Management/Adjunct)  07/07/2011 3:45 PM  Type of Therapy:  Group Therapy  Participation Level:  Minimal  Participation Quality:  Attentive, Resistant and Sharing  Affect:  Anxious  Cognitive:  Appropriate  Insight:  Limited  Engagement in Group:  Limited  Engagement in Therapy:  Good  Modes of Intervention:  Clarification, Problem-solving and Support  Summary of Progress/Problems: Group processing session centered on feelings about relapse and cycle of addiction.  Patient shared feelings of anxiety which often lead me back to drinking, others in group shared their own identification with Rich's feelings.   Clide Dales 07/07/2011, 3:47 PM

## 2011-07-08 NOTE — Progress Notes (Signed)
Patient ID: Ian [name], male   DOB: May 07, 1980, 31 y.o.   MRN: [id number] Pt. attended and participated in aftercare planning group. Pt. verbally accepted information on suicide prevention, warning signs to look for with suicide and crisis line numbers to use. The pt. agreed to call crisis line numbers if having warning signs or having thoughts of suicide. Pt. listed their current anxiety level as 7 and depression level as 8 on a scale of 1 to 10 with 10 being the high.  Pt. accepted an AA meeting schedule.

## 2011-07-08 NOTE — Progress Notes (Signed)
  Ian [name] is a 31 y.o. male [id number] October 01, 1980  07/04/2011 Principal Problem:  *Alcohol dependence   Mental Status: Mood is better denies SI/HI/AVH.    Subjective/Objective: Not sure of his response to Seroquel 25 mg BID. Still notices mood changes. Might have a place to go after discharge but will probably have to work with case manager Monday. His parents wanted disability looked into as he can't seem to keep a job.  Filed Vitals:   07/08/11 0701  BP: 118/82  Pulse: 98  Temp:   Resp:     Lab Results:   BMET    Component Value Date/Time   NA 142 07/03/2011 2120   K 3.8 07/03/2011 2120   CL 102 07/03/2011 2120   CO2 24 07/03/2011 2120   GLUCOSE 103* 07/03/2011 2120   BUN 14 07/03/2011 2120   CREATININE 0.90 07/03/2011 2120   CALCIUM 9.7 07/03/2011 2120   GFRNONAA >90 07/03/2011 2120   GFRAA >90 07/03/2011 2120    Medications:  Scheduled:     . chlordiazePOXIDE  25 mg Oral BH-qamhs   Followed by  . chlordiazePOXIDE  25 mg Oral Daily  . citalopram  30 mg Oral Daily  . multivitamin with minerals  1 tablet Oral Daily  . QUEtiapine  25 mg Oral BID  . thiamine  100 mg Oral Daily  . traZODone  50 mg Oral QHS  . DISCONTD: citalopram  20 mg Oral Daily     PRN Meds acetaminophen, alum & mag hydroxide-simeth, magnesium hydroxide, magnesium hydroxide  Plan : continue current plan of care.  Akera Snowberger,MICKIE D. 07/08/2011

## 2011-07-08 NOTE — Progress Notes (Signed)
Patient ID: Ian [name], male   DOB: 30-Apr-1980, 31 y.o.   MRN: [id number]   [facility] Group Notes:  (Counselor/Nursing/MHT/Case Management/Adjunct)  07/08/2011 1:15 PM  Type of Therapy:  Group Therapy, Dance/Movement Therapy   Participation Level:  Active  Participation Quality:  Appropriate  Affect:  Appropriate  Cognitive:  Appropriate  Insight:  Good  Engagement in Group:  Good  Engagement in Therapy:  Good  Modes of Intervention:  Clarification, Problem-solving, Role-play, Socialization and Support  Summary of Progress/Problems: Therapist and group members discussed self-sabotaging behaviors and how are family sometimes act as enablers. Therapist shared the "Five Chapters" and modeled the poem using movement. Therapist and group members then discussed recognizing self-sabotaging behaviors and and the choices we have. Pt. shared that he was feeling like a basset hound, meaning that he was uncertain and feeling low and lost. Pt. spoke about is own self-pity and how that has made him feel guilty and ashamed.      Cassidi Long

## 2011-07-08 NOTE — Progress Notes (Signed)
D: Pt in bed resting with eyes closed. Respirations even and unlabored. Pt appears to be in no signs of distress at this time. A: Q15min checks remains for this pt. R: Pt remains safe at this time.   

## 2011-07-08 NOTE — Progress Notes (Signed)
D- Pt is out in milieu interacting with peers and attending groups with good participation.    A-Rates depression/anxiety 7-8. Good sleep,improving appetite and low energy. Diarrhea,cravings and chilling. Denies SI. C/O ha and requested and received prn medications with relief. R-Compliant with scheduled medications. Continue with current POC and evaluation of goals. Continue with 15' checks for safety.

## 2011-07-09 MED ORDER — NALTREXONE HCL 50 MG PO TABS
50.0000 mg | ORAL_TABLET | Freq: Every day | ORAL | Status: DC
  Administered 2011-07-09 – 2011-07-11 (×3): 50 mg via ORAL
  Filled 2011-07-09 (×5): qty 1

## 2011-07-09 NOTE — Progress Notes (Signed)
  CYAN MOULTRIE is a 31 y.o. male [id number] Jan 13, 1980  07/04/2011 Principal Problem:  *Alcohol dependence   Mental Status: Mood says he has mood swings but is allright . Denies SI/HI/AVH     Subjective/Objective:  Asks if his ups and downs could be related to withdrawal and the Seroquel. Also asks for Naltrexone for cravings.  Filed Vitals:   07/09/11 0701  BP: 122/86  Pulse: 94  Temp:   Resp:     Lab Results:   BMET    Component Value Date/Time   NA 142 07/03/2011 2120   K 3.8 07/03/2011 2120   CL 102 07/03/2011 2120   CO2 24 07/03/2011 2120   GLUCOSE 103* 07/03/2011 2120   BUN 14 07/03/2011 2120   CREATININE 0.90 07/03/2011 2120   CALCIUM 9.7 07/03/2011 2120   GFRNONAA >90 07/03/2011 2120   GFRAA >90 07/03/2011 2120    Medications:  Scheduled:     . citalopram  30 mg Oral Daily  . multivitamin with minerals  1 tablet Oral Daily  . QUEtiapine  25 mg Oral BID  . thiamine  100 mg Oral Daily  . traZODone  50 mg Oral QHS     PRN Meds acetaminophen, alum & mag hydroxide-simeth, magnesium hydroxide, magnesium hydroxide Plan continue current plan of care add Naltrexone for cravings.  Marasia Newhall,MICKIE D. 07/09/2011

## 2011-07-09 NOTE — Progress Notes (Signed)
Patient ID: Ian [name], male   DOB: [birth year], 31 y.o.   MRN: [id number]  Pt. attended and participated in aftercare planning group. Pt. verbally accepted information on suicide prevention, warning signs to look for with suicide and crisis line numbers to use. Pt. listed their current anxiety level as 7 and their current depression level as 7. Pt. shared that he feels like he is constantly worrying. Group discussed personal responsibility and pt. was very insightful.

## 2011-07-09 NOTE — Progress Notes (Signed)
D-Pt is out In milieu interacting with peers and attending groups. Continues to c/o mild HA. A-Rates depression at 7 and hopelessness at 8.  Sweats reported but unsure of exact cause. Denies SI. Insightful with SA issues and long term treatment being considered. Reports good sleep and improving appetite.  A- Support and encouragent given. Continue current POC with evaluation. Continue 15' checks for safety.

## 2011-07-09 NOTE — Progress Notes (Signed)
Patient ID: Ian [name], male   DOB: Apr 13, 1980, 31 y.o.   MRN: [id number]   [facility] Group Notes:  (Counselor/Nursing/MHT/Case Management/Adjunct)  07/09/2011 1:15 PM  Type of Therapy:  Group Therapy, Dance/Movement Therapy   Participation Level:  Minimal  Participation Quality:  Appropriate  Affect:  Appropriate  Cognitive:  Appropriate  Insight:  Good  Engagement in Group:  Limited  Engagement in Therapy:  Limited  Modes of Intervention:  Clarification, Problem-solving, Role-play, Socialization and Support  Summary of Progress/Problems: Therapist began discussion on needs and how our needs should be supported. Group transitioned to discussing the affect that substances have on our bodies and coping skills to battle detox. Pt. did not share very much during group today but appeared to be listening and attentive.     Cassidi Long

## 2011-07-09 NOTE — Progress Notes (Signed)
Patient ID: Ian [name], male   DOB: 08/04/1980, 31 y.o.   MRN: [id number] Has been out and bout in the milieu this evening, interacting appropriately with staff and peers.  No c/o at present of w/d sx, participated in group.  Affect somewhat blunted and depressed, but logical and asking appropriate questions.  Will continue to monitor.

## 2011-07-10 NOTE — [facility] Suicide Risk Assessment (Signed)
Suicide Risk Assessment  Discharge Assessment      Demographic factors: Male  Current Mental Status Per Nursing Assessment: At Discharge: Pt denied any SI/HI/thoughts of self harm or acute psychiatric issues in treatment team with clinical, nursing and medical team present.  Current Mental Status Per Physician: Current Mental Status:  Patient seen and evaluated. Chart reviewed. Patient stated that his mood was "ok". His affect was mood congruent and less anxious. He denied any current thoughts of self injurious behavior, suicidal ideation or homicidal ideation.  Sig anxiety starting at age 31, on Anafranil as a child.  Separation anxiety as well.  There were no auditory or visual hallucinations, paranoia, delusional thought processes, or mania noted.  Thought process was linear and goal directed.  No psychomotor agitation or retardation was noted. His speech was normal rate, tone and volume. Eye contact was good. Judgment and insight are fair.  Patient has been up and engaged on the unit.  No safety concerns reported from team.    Loss Factors: separation from GF; unemployment; recent separation from son   Historical Factors: employment loss; hx on Zoloft and Xanax; Hx Tx at [facility]  Risk Reduction Factors: Responsible for children under 30 years of age;Sense of responsibility to family;Religious beliefs about death;Living with another person, especially a relative; 31 y/o; back and forth from parents home; working in [place] and staying in hotel  Discharge Diagnoses: Alcohol Dependence & W/D; GAD; SIMD   Cognitive Features That Contribute To Risk: none.  Meds:    . citalopram  30 mg Oral Daily  . multivitamin with minerals  1 tablet Oral Daily  . naltrexone  50 mg Oral Daily  . QUEtiapine  25 mg Oral BID  . thiamine  100 mg Oral Daily  . traZODone  50 mg Oral QHS    Suicide Risk: Pt viewed as a chronic moderate increased risk of harm to self in light of his past hx and risk factors.  No  acute safety concerns noted on the unit.  Pt contracting for safety and stable for discharge on 07/11/11.  Plan Of Care/Follow-up recommendations: Pt seen and evaluated in treatment team. Chart reviewed.  Pt stable for discharge, yet still mildly anxious. Pt contracting for safety and does not currently meet [facility] involuntary commitment criteria for continued hospitalization against his will.  Mental health treatment, medication management and continued sobriety will mitigate against the potential increased risk of harm to self and/or others.  Discussed the importance of recovery further with pt, as well as, tools to move forward in a healthy & safe manner.  Pt agreeable with the plan.  Discussed with the team.  Please see orders, follow up appointments per AVS and full discharge summary to be completed by physician extender.  Recommend follow up with AA.  Diet: Regular.  Activity: As tolerated.     Lupe Carney 07/10/2011, 4:35 PM

## 2011-07-10 NOTE — Progress Notes (Signed)
Patient ID: Ian [name], male   DOB: [birth year], 31 y.o.   MRN: [id number] Has been out on the hall, spent most of the evening in the dayroom, attended group and watched some tv afterward.  Hs been interacting appropriately with select peers and staff, seems less bright today but cooperative and pleasant, trying to learn more coping skills , feels has been learning from the program and feels stay here has been good.  Will continue to monitor.

## 2011-07-10 NOTE — Progress Notes (Signed)
[facility] Adult Inpatient Family/Significant Other Collateral Contact  Collateral Contact:  Writer returned call from patient's step father, Sherlynn Carbon at [phone], this morning who was interested to know about Ian [name]'s potential discharge.  Step father wished to reiterate that patient will be unable to return to family home. The family is however willing to support first months rent at an [facility].  Stepfather informed patient would know more about discharge after meeting with treatment team and/or psychiatrist this morning. Clide Dales 07/10/2011 9:00 AM

## 2011-07-10 NOTE — Progress Notes (Signed)
Pt has been up for groups today.  Pt rated both his depression and hopelessness a 7 on his self-inventory.  He stated,"I have anxiety that comes and goes"  He reported fair sleep but could not identify a factor of his decrease in sleep.  He stated,"just not as well as the previous 2 nights"  Pt denied any S/H ideations or A/V hallucinations.  It was dicussed in treatment team this morning that pt's plan now is to go to an [facility] by his choosing.  If pt continues to do well, he will probably discharge tomorrow.  Pt did discuss in treatment team that he has been having either "panic attacks" or "severe anxiety" at night.  He stated,"I can feel my heart pounding in my chest at night.  Dr. Kirtland Bouchard. did inform pt that she has no intention in changing his medications today.  Pt voiced understanding.

## 2011-07-10 NOTE — Progress Notes (Signed)
[facility] Group Notes:  (Counselor/Nursing/MHT/Case Management/Adjunct)  07/10/2011 3:47 PM   Type of Therapy:  Group Therapy from 1:15 to 2:30  Participation Level:  Minimal  Participation Quality:  Appropriate  Affect:  Anxious  Cognitive:  Appropriate  Insight:  Limited  Engagement in Group:  Limited  Engagement in Therapy:  Limited  Modes of Intervention:  Clarification, Socialization and Support  Summary of Progress/Problems:  Group discussion focused on what patient's see as their own obstacles to recovery.  Patient shares belief that his anxiety prevents him from maintaining recovery.  Patient shared periods of things going well for a while and then my anxiety taking over to point I drink.  This was good segway into idea of "resless, irritable and discontent being part of early recovery."  Others in group also shared their experience with anxiety although it did not apparently compare to Jonathon's.  When session closed with what patient's see as their hook for recovery; he  stated  "I guess I'd get to go on the next beach trip; I've missed 3 with my family."   Clide Dales 07/10/2011, 3:53 PM

## 2011-07-10 NOTE — Progress Notes (Signed)
Pt attended discharge planning group and actively participated.  Pt presents with flat affect and depressed mood.  Pt rates depression and anxiety at a 7 today.  Pt denies SI.  Pt reports his mood is up and down.  Pt states that he doesn't want to go to rehab now.  Pt wants to go to an [facility].  SW will provide pt with a list of [facility] to start arranging interviews.  Pt is scheduled to d/c tomorrow.  SW will refer pt to outpatient services and AA meetings.  No further needs voiced by pt at this time.    Reyes Ivan, LCSWA 07/10/2011  10:45 AM

## 2011-07-10 NOTE — Tx Team (Signed)
Interdisciplinary Treatment Plan Update (Adult)  Date:  07/10/2011  Time Reviewed:  9:54 AM   Progress in Treatment: Attending groups: Yes Participating in groups:  Yes Taking medication as prescribed: Yes Tolerating medication:  Yes Family/Significant othe contact made:  Yes Patient understands diagnosis:  Yes Discussing patient identified problems/goals with staff:  Yes Medical problems stabilized or resolved:  Yes Denies suicidal/homicidal ideation: Yes Issues/concerns per patient self-inventory:  None identified Other: N/A  New problem(s) identified: None Identified  Reason for Continuation of Hospitalization: Other; describe securing discharge plans  Interventions implemented related to continuation of hospitalization: mood stabilization, medication monitoring and adjustment, group therapy and psycho education, safety checks q 15 mins  Additional comments: N/A  Estimated length of stay: 1 day  Discharge Plan: Pt will follow up with outpatient services and [facility]    New goal(s): N/A  Review of initial/current patient goals per problem list:   1. Goal(s): Address substance use  Met: No  Target date: by discharge  As evidenced by: completing detox protocol and refer to appropriate treatment   2. Goal (s): Reduce depressive and anxiety symptoms  Met: No  Target date: by discharge  As evidenced by: Reducing depression from a 10 to a 3 as reported by pt.  Pt rates at a 7 today.    Attendees: Patient:  Ian [name] 07/10/2011 9:55 AM   Family:     Physician:  Lupe Carney, DO 07/10/2011 9:54 AM   Nursing: Robbie Louis, RN 07/10/2011 9:54 AM   Case Manager:  Reyes Ivan, LCSWA 07/10/2011  9:54 AM   Counselor:  Ronda Fairly, LCSWA 07/10/2011  9:54 AM   Other:  Richelle Ito, LCSW 07/10/2011 9:54 AM   Other:  Alease Frame, RN 07/10/2011 9:56 AM   Other:  Orlan Leavens, RN 07/10/2011 9:56 AM   Other:      Scribe for Treatment Team:   Reyes Ivan 07/10/2011  9:54 AM

## 2011-07-10 NOTE — Progress Notes (Signed)
[facility] Group Notes:  (Counselor/Nursing/MHT/Case Management/Adjunct)  07/10/2011 1:57 PM  Type of Therapy:  Psychoeducational Skills  Participation Level:  Active  Participation Quality:  Appropriate  Affect:  Appropriate  Cognitive:  Appropriate  Insight:  Good  Engagement in Group:  Good  Engagement in Therapy:  Good  Modes of Intervention:  Support  Summary of Progress/Problems:   Christ Kick 07/10/2011, 1:57 PM

## 2011-07-11 MED ORDER — CITALOPRAM HYDROBROMIDE 10 MG PO TABS
30.0000 mg | ORAL_TABLET | Freq: Every day | ORAL | Status: DC
  Filled 2011-07-11: qty 42

## 2011-07-11 MED ORDER — TRAZODONE HCL 50 MG PO TABS: 50.0000 mg | ORAL_TABLET | Freq: Every day | ORAL | Status: DC

## 2011-07-11 MED ORDER — TRAZODONE HCL 50 MG PO TABS
50.0000 mg | ORAL_TABLET | Freq: Every day | ORAL | Status: DC
  Filled 2011-07-11: qty 14

## 2011-07-11 MED ORDER — QUETIAPINE FUMARATE 25 MG PO TABS: 25.0000 mg | ORAL_TABLET | Freq: Two times a day (BID) | ORAL | Status: DC

## 2011-07-11 MED ORDER — NALTREXONE HCL 50 MG PO TABS: 50.0000 mg | ORAL_TABLET | Freq: Every day | ORAL | Status: DC

## 2011-07-11 MED ORDER — QUETIAPINE FUMARATE 25 MG PO TABS
25.0000 mg | ORAL_TABLET | Freq: Two times a day (BID) | ORAL | Status: DC
  Filled 2011-07-11 (×2): qty 28

## 2011-07-11 MED ORDER — ALBUTEROL SULFATE HFA 108 (90 BASE) MCG/ACT IN AERS: 2.0000 | INHALATION_SPRAY | Freq: Four times a day (QID) | RESPIRATORY_TRACT | Status: DC | PRN

## 2011-07-11 MED ORDER — CITALOPRAM HYDROBROMIDE 10 MG PO TABS: 30.0000 mg | ORAL_TABLET | Freq: Every day | ORAL | Status: DC

## 2011-07-11 MED ORDER — NALTREXONE HCL 50 MG PO TABS
50.0000 mg | ORAL_TABLET | Freq: Every day | ORAL | Status: DC
  Filled 2011-07-11: qty 14

## 2011-07-11 NOTE — Progress Notes (Signed)
[facility] Group Notes:  (Counselor/Nursing/MHT/Case Management/Adjunct)  07/11/2011 2:23 PM  Type of Therapy:  Psychoeducational Skills  Participation Level:  Active  Participation Quality:  Supportive  Affect:  Appropriate  Cognitive:  Appropriate  Insight:  Good  Engagement in Group:  Good  Engagement in Therapy:  Good  Modes of Intervention:  Support  Summary of Progress/Problems:   Christ Kick 07/11/2011, 2:23 PM

## 2011-07-11 NOTE — Progress Notes (Signed)
Patient ID: Ian [name], male   DOB: June 15, 1980, 31 y.o.   MRN: [id number] All d/c instructions reviewed with patient and he verbalized his understanding. Denies SI or HI. Belongings returned.

## 2011-07-11 NOTE — Progress Notes (Signed)
[facility] Group Notes:  (Counselor/Nursing/MHT/Case Management/Adjunct)  07/12/2011 9:00 AM  Type of Therapy:  Group Therapy at 1:15 to 2:39 PM  Participation Level:  Active  Participation Quality:  Appropriate  Affect:  Appropriate  Cognitive:  Appropriate  Insight:  Good  Engagement in Group:  Good  Engagement in Therapy:  Good  Modes of Intervention:  Education, Socialization and Support  Summary of Progress/Problems: The first portion of group today was  presentation by director of  [employer] ([employer]). Babak was attentive and appropriate during presentation.  Focus during remainder of group was processing feelings about diagnosis; discussion included denial, bargaining, anger, depression, and acceptance to which patients related to in varying degrees.  Tyshaun shared at several points during discussion. He shared how he has often gotten to acceptance only to set himself up for failure by losing focus and using again.  Questioned if others feel like they sometimes self sabotage.  Patient was willing to explore how to keep recovery as main focus by staying in today.   Clide Dales 07/12/2011, 9:05 AM

## 2011-07-11 NOTE — Progress Notes (Signed)
07/11/2011         Time: 1500      Group Topic/Focus: The focus of this group is on enhancing the patient's understanding of leisure, barriers to leisure, and the importance of engaging in positive leisure activities upon discharge for improved total health.   Participation Level: Active  Participation Quality: Appropriate and Attentive  Affect: Appropriate  Cognitive: Oriented   Additional Comments: Patient able to identify leisure activities, like swimming, he can engage in upon discharge. Patient reports having some anxiety about discharge, but reports he has learned new tools/strategies to help him stay sober upon discharge.  Ian [name] 07/11/2011 4:18 PM

## 2011-07-11 NOTE — Progress Notes (Signed)
Patient ID: Ian [name], male   DOB: 28-Jul-1980, 31 y.o.   MRN: [id number] D: Pt. Concerned about discharge date, sleep meds, reports depression at 5 of 10, "mind not wandering" A: Monitored q49min, Pt. Encouraged to speak with doctor & CM concerning discharge R: Pt. Received Trazodone for sleep, Pt. Remains safe on the unit.

## 2011-07-11 NOTE — Tx Team (Signed)
Interdisciplinary Treatment Plan Update (Adult)  Date:  07/11/2011  Time Reviewed:  9:58 AM   Progress in Treatment: Attending groups: Yes Participating in groups:  Yes Taking medication as prescribed: Yes Tolerating medication:  Yes Family/Significant othe contact made: Yes  Patient understands diagnosis:  Yes Discussing patient identified problems/goals with staff:  Yes Medical problems stabilized or resolved:  Yes Denies suicidal/homicidal ideation: Yes Issues/concerns per patient self-inventory:  None identified Other: N/A  New problem(s) identified: None Identified  Reason for Continuation of Hospitalization: D/C today  Interventions implemented related to continuation of hospitalization: D/C today  Additional comments: N/A  Estimated length of stay: D/C today  Discharge Plan: Pt will follow up with [facility] for medication management and therapy, AA meetings and [employer] goal(s): N/A  Review of initial/current patient goals per problem list:   1. Goal(s): Address substance use  Met: Yes Target date: by discharge  As evidenced by: completed detox protocol and referred to appropriate treatment   2. Goal (s): Reduce depressive and anxiety symptoms  Met: No  Target date: by discharge  As evidenced by: Reducing depression from a 10 to a 3 as reported by pt. Pt rates at a 5 today but reports feeling stable to d/c.   Attendees: Patient:  Ian [name] 07/11/2011 10:00 AM   Family:     Physician:  Lupe Carney, DO 07/11/2011 9:58 AM   Nursing: Robbie Louis, RN 07/11/2011 9:58 AM   Case Manager:  Reyes Ivan, LCSWA 07/11/2011  9:58 AM   Counselor:  Ronda Fairly, LCSWA 07/11/2011  9:58 AM   Other:  Richelle Ito, LCSW 07/11/2011 9:58 AM   Other:  Orlan Leavens, RN 07/11/2011 10:00 AM   Other:  Chinita Greenland, RN 07/11/2011 10:00 AM   Other:      Scribe for Treatment Team:   Reyes Ivan 07/11/2011 9:58 AM

## 2011-07-11 NOTE — Progress Notes (Signed)
[facility] Group Notes:  (Counselor/Nursing/MHT/Case Management/Adjunct)  07/11/2011 2:02 PM  Type of Therapy:  Psychoeducational Skills  Participation Level:  Did Not Attend   Summary of Progress/Problems: Ian [name] c/o being sick and did not attend Psycho education group on Labels.    Wandra Scot 07/11/2011, 2:02 PM

## 2011-07-11 NOTE — Progress Notes (Signed)
[facility] Case Management Discharge Plan:  Will you be returning to the same living situation after discharge: No. Pt will live in an [facility] At discharge, do you have transportation home?:Yes,  has own transportation Do you have the ability to pay for your medications:Yes,  access to meds  Release of information consent forms completed and in the chart;  Patient's signature needed at discharge.  Patient to Follow up at:  Follow-up Information    Follow up with [facility] on 07/14/2011. (Appointment scheduled at 8:00 am with Dr. Hulen Skains)    Contact information:   [address], [place] [address] [phone]         Patient denies SI/HI:   Yes,  denies SI/HI    Safety Planning and Suicide Prevention discussed:  Yes,  discussed with pt today  Barrier to discharge identified:No.  Summary and Recommendations: Pt attended discharge planning group and actively participated.  Pt presents with calm mood and affect.  Pt rates depression and anxiety at a 5 today.  Pt denies SI.  Pt is nervous about d/c but reports feeling ready to d/c.  Pt is working on getting into an [employer].  Pt found 3 homes that have openings and plans to interview with them today.  No recommendations from SW.  No further needs voiced by pt.  Pt stable to discharge.     Carmina Miller 07/11/2011, 10:24 AM

## 2011-07-11 NOTE — Progress Notes (Addendum)
Patient signed consent for staff to speak with his mother Renold Genta at [phone] today during treatment team meeting.  A message was left for her with updated information on Monday evening by Everlene Balls with verbal permission from patient.  Patient reports at 12:15 he has spoken with mother who is out of town today and will contact her once plan is formalized.  Should Mrs Donzetta Matters contact anyone on team we have formal consent to speak with her. Clide Dales 07/11/2011 12:48 PM

## 2011-07-12 NOTE — Progress Notes (Signed)
Patient Discharge Instructions:  After Visit Summary (AVS):   Faxed to:  07/12/2011 Psychiatric Admission Assessment Note:   Faxed to:  07/12/2011 Suicide Risk Assessment - Discharge Assessment:   Faxed to:  07/12/2011 Faxed/Sent to the Next Level Care provider:  07/12/2011  Faxed to [facility] - Dr. Dr. Eulah Pont @ [phone]  Wandra Scot, 07/12/2011, 6:35 PM

## 2011-08-01 NOTE — Discharge Summary (Signed)
Physician Discharge Summary Note  Patient:  Ian [name] is an 31 y.o., male MRN:  [id number] DOB:  02-11-1980 Patient phone:  [phone] (home)  Patient address:   [address] [place] [address],   Date of Admission:  07/04/2011 Date of Discharge: 07/11/11  Reason for Admission: Alcohol detoxification  Discharge Diagnoses: Principal Problem:  *Alcohol dependence   Axis Diagnosis:   AXIS I:  Alcohol dependence AXIS II:  Deferred AXIS III:   Past Medical History  Diagnosis Date  . Anxiety   . Depression   . Hypertension    AXIS IV:  other psychosocial or environmental problems AXIS V:  65  Level of Care:  OP  Hospital Course:  The patient presented to ED and requested assistance with detoxing off of alcohol. He states he has been drinking 1/5th of liquor a day. He reports that he has previous detoxes at [facility] 1 1/2 years ago, and 2 years ago at [facility]. He denies previous suicide attempts, or SI/HI. He reports decreased sleep, poor appetite, and significant depression of 8-9/10. He notes that he has had a previous episode of auditory and visual hallucinations recently while sobering up from a rather hard binge where he reports consuming 1/2 gallon a day of liquor.   Upon admission in this hospital, Mr. Luttrull was started on Librium protocol for his alcohol detoxification. He was also enrolled in group counseling sessions and activities to learn coping skills. He also attended AA/NA meetings being offered and held in this unit. He has some previous and or identifiable medical conditions that required treatment or monitoring. He received medication management for all those health issues as well. He was monitored closely for any potential problems that may arise as of and or during detoxification treatment. Patient tolerated his treatment regimen and detoxification treatment without any significant adverse effects and or reactions.  Patient attended treatment  team meeting this am and met with the team. His symptoms, substance abuse issues, response to to treatment and discharge plans discussed. Patient endorsed that he is doing well and stable for discharge to pursue the next phase of his substance abuse treatment.  He was encouraged to attend 90 AA meetings in 90 days, get a trusted sponsor from the advise of others or from whomever within the AA meetings seems to make sense, and has a proven track record, and will hold him responsible for his sobriety, and both expects and insists on total abstinence from alcohol. Mr. Dubin was instructed to do the steps honestly with the trusted sponsor.He is encouraged to get obsessed with his recovery by often reminding himself of how deadly this dredged horrible disease of addiction just is. He must focus the first of each month on speaker meetings where he will specifically look at how his life has been wrecked by drugs/alcohol and how his life has been similar to that of the speaker's life.   It was agreed upon between patient and the team that he will be discharged to the [place] house to continue substance abuse treatment.  However, he will follow-up at [facility] on 08/03/11 @ 08:00 am with Dr. Eulah Pont for medication management. Upon discharge, patient adamantly denies suicidal, homicidal ideations, auditory, visual hallucinations, delusional thinking and or withdrawal symptoms. Patient left [facility] with all personal belongings in no apparent distress. He received 2 weeks worth samples of his discharge medications. Transportation per patient himself.   Consults:  None  Significant Diagnostic Studies:  labs: CBC with diff, CMP, Toxicology  report, Urinalysis  Discharge Vitals:   Blood pressure 116/78, pulse 112, temperature 97.3 F (36.3 C), temperature source Oral, resp. rate 16, height 5\' 10"  (1.778 m), weight 89.359 kg (197 lb).  Mental Status Exam: See Mental Status Examination and Suicide Risk Assessment completed  by Attending Physician prior to discharge.  Discharge destination:  Other:  [facility]  Is patient on multiple antipsychotic therapies at discharge:  No   Has Patient had three or more failed trials of antipsychotic monotherapy by history:  No  Recommended Plan for Multiple Antipsychotic Therapies: NA  Discharge Orders    Future Orders Please Complete By Expires   Diet - low sodium heart healthy        Medication List  As of 08/01/2011  8:42 AM   STOP taking these medications         ALPRAZolam 0.25 MG tablet      ibuprofen 400 MG tablet         TAKE these medications      Indication    albuterol 108 (90 BASE) MCG/ACT inhaler   Commonly known as: PROVENTIL HFA;VENTOLIN HFA   Inhale 2 puffs into the lungs every 6 (six) hours as needed for shortness of breath. For shortness of breath       citalopram 10 MG tablet   Commonly known as: CELEXA   Take 3 tablets (30 mg total) by mouth daily. For anxiety       naltrexone 50 MG tablet   Commonly known as: DEPADE   Take 1 tablet (50 mg total) by mouth daily. Anti-craving medication       QUEtiapine 25 MG tablet   Commonly known as: SEROQUEL   Take 1 tablet (25 mg total) by mouth 2 (two) times daily. For anxiety       traZODone 50 MG tablet   Commonly known as: DESYREL   Take 1 tablet (50 mg total) by mouth at bedtime. For sleep            Follow-up Information    Follow up with [facility] on 07/14/2011. (Appointment scheduled at 8:00 am with Dr. Hulen Skains)    Contact information:   [address], [place] [address] [phone]         Follow-up recommendations:  Activity:  as tolerated Other:  Keep all scheduled follow-up appointments as recommended.    Comments:  Take all your medications as prescribed by your mental healthcare provider. Report any adverse effects and or reactions from your medicines to your outpatient provider promptly. Patient is instructed and cautioned to not engage in alcohol and or  illegal drug use while on prescription medicines. In the event of worsening symptoms, patient is instructed to call the crisis hotline, 911 and or go to the nearest ED for appropriate evaluation and treatment of symptoms. Follow-up with your primary care provider for your other medical issues, concerns and or health care needs.     SignedArmandina Stammer I 08/01/2011, 8:42 AM

## 2011-10-16 ENCOUNTER — Emergency Department (HOSPITAL_COMMUNITY)
Admission: EM | Admit: 2011-10-16 | Discharge: 2011-10-18 | Disposition: A | Discharge status: Home / Self Care | Payer: Self-pay | Attending: Emergency Medicine | Admitting: Emergency Medicine

## 2011-10-16 DIAGNOSIS — Z79899 Other long term (current) drug therapy: Secondary | ICD-10-CM | POA: Insufficient documentation

## 2011-10-16 DIAGNOSIS — I10 Essential (primary) hypertension: Secondary | ICD-10-CM | POA: Insufficient documentation

## 2011-10-16 DIAGNOSIS — F341 Dysthymic disorder: Secondary | ICD-10-CM | POA: Insufficient documentation

## 2011-10-16 DIAGNOSIS — F1010 Alcohol abuse, uncomplicated: Secondary | ICD-10-CM

## 2011-10-16 LAB — RAPID URINE DRUG SCREEN, HOSP PERFORMED
Amphetamines: NOT DETECTED
Barbiturates: NOT DETECTED
Benzodiazepines: NOT DETECTED

## 2011-10-16 LAB — COMPREHENSIVE METABOLIC PANEL
Albumin: 4 g/dL (ref 3.5–5.2)
Alkaline Phosphatase: 65 U/L (ref 39–117)
BUN: 16 mg/dL (ref 6–23)
CO2: 25 mEq/L (ref 19–32)
Chloride: 100 mEq/L (ref 96–112)
Creatinine, Ser: 0.96 mg/dL (ref 0.50–1.35)
GFR calc non Af Amer: >90 mL/min (ref >90)
Glucose, Bld: 109 mg/dL — ABNORMAL HIGH (ref 70–99)
Potassium: 3.7 mEq/L (ref 3.5–5.1)
Total Bilirubin: 0.3 mg/dL (ref 0.3–1.2)

## 2011-10-16 LAB — ETHANOL: Alcohol, Ethyl (B): 399 mg/dL — ABNORMAL HIGH (ref 0–11)

## 2011-10-16 LAB — CBC
HCT: 45.7 % (ref 39.0–52.0)
Hemoglobin: 16.3 g/dL (ref 13.0–17.0)
MCV: 89.3 fL (ref 78.0–100.0)
RBC: 5.12 MIL/uL (ref 4.22–5.81)
WBC: 5.9 10*3/uL (ref 4.0–10.5)

## 2011-10-16 LAB — ACETAMINOPHEN LEVEL: Acetaminophen (Tylenol), Serum: <15 ug/mL (ref 10–30)

## 2011-10-16 LAB — SALICYLATE LEVEL: Salicylate Lvl: <2 mg/dL — ABNORMAL LOW (ref 2.8–20.0)

## 2011-10-16 MED ORDER — THIAMINE HCL 100 MG/ML IJ SOLN: 100.0000 mg | Freq: Every day | INTRAMUSCULAR | Status: DC

## 2011-10-16 MED ORDER — LORAZEPAM 1 MG PO TABS: 0.0000 mg | ORAL_TABLET | Freq: Two times a day (BID) | ORAL | Status: DC

## 2011-10-16 MED ORDER — LORAZEPAM 2 MG/ML IJ SOLN: 1.0000 mg | Freq: Four times a day (QID) | INTRAMUSCULAR | Status: DC | PRN

## 2011-10-16 MED ORDER — NICOTINE 21 MG/24HR TD PT24: 21.0000 mg | MEDICATED_PATCH | Freq: Every day | TRANSDERMAL | Status: DC

## 2011-10-16 MED ORDER — IBUPROFEN 200 MG PO TABS
600.0000 mg | ORAL_TABLET | Freq: Three times a day (TID) | ORAL | Status: DC | PRN
  Administered 2011-10-17: 600 mg via ORAL
  Filled 2011-10-16: qty 3

## 2011-10-16 MED ORDER — FOLIC ACID 1 MG PO TABS
1.0000 mg | ORAL_TABLET | Freq: Every day | ORAL | Status: DC
  Administered 2011-10-17 – 2011-10-18 (×2): 1 mg via ORAL
  Filled 2011-10-16 (×2): qty 1

## 2011-10-16 MED ORDER — ONDANSETRON HCL 4 MG PO TABS
4.0000 mg | ORAL_TABLET | Freq: Three times a day (TID) | ORAL | Status: DC | PRN
Start: 2011-10-16 — End: 2011-10-18
  Administered 2011-10-16 – 2011-10-18 (×4): 4 mg via ORAL
  Filled 2011-10-16 (×4): qty 1

## 2011-10-16 MED ORDER — LORAZEPAM 1 MG PO TABS
1.0000 mg | ORAL_TABLET | Freq: Four times a day (QID) | ORAL | Status: DC | PRN
  Administered 2011-10-17 – 2011-10-18 (×3): 1 mg via ORAL
  Filled 2011-10-16: qty 1
  Filled 2011-10-16: qty 2
  Filled 2011-10-16 (×2): qty 1

## 2011-10-16 MED ORDER — ADULT MULTIVITAMIN W/MINERALS CH
1.0000 | ORAL_TABLET | Freq: Every day | ORAL | Status: DC
  Administered 2011-10-17 – 2011-10-18 (×2): 1 via ORAL
  Filled 2011-10-16 (×2): qty 1

## 2011-10-16 MED ORDER — ALBUTEROL SULFATE HFA 108 (90 BASE) MCG/ACT IN AERS: 2.0000 | INHALATION_SPRAY | Freq: Four times a day (QID) | RESPIRATORY_TRACT | Status: DC | PRN

## 2011-10-16 MED ORDER — LORAZEPAM 1 MG PO TABS
0.0000 mg | ORAL_TABLET | Freq: Four times a day (QID) | ORAL | Status: DC
  Administered 2011-10-16: 2 mg via ORAL

## 2011-10-16 MED ORDER — VITAMIN B-1 100 MG PO TABS
100.0000 mg | ORAL_TABLET | Freq: Every day | ORAL | Status: DC
  Administered 2011-10-17 – 2011-10-18 (×2): 100 mg via ORAL
  Filled 2011-10-16 (×2): qty 1

## 2011-10-16 NOTE — ED Notes (Signed)
Pt requesting to detox from alcohol. Pt last drink was at 4pm today.[email or web address].pwd

## 2011-10-16 NOTE — ED Provider Notes (Signed)
History     CSN: [id number]  Arrival date & time 10/16/11  2005   First MD Initiated Contact with Patient 10/16/11 2225      Chief Complaint  Patient presents with  . Medical Clearance    (Consider location/radiation/quality/duration/timing/severity/associated sxs/prior treatment) HPI Comments: Patient with history of heavy alcohol use, previous detox, presents with request for detox. Patient states that he drinks approximately a fifth of hard liquor a day. He states he has vomiting with drinking. Otherwise she denies current medical complaints. He denies suicidal or homicidal ideation. Course is constant. Patient was seen at [facility] prior to coming to the emergency department.  The history is provided by the patient.    Past Medical History  Diagnosis Date  . Anxiety   . Depression   . Hypertension     No past surgical history on file.  No family history on file.  History  Substance Use Topics  . Smoking status: Current Some Day Smoker  . Smokeless tobacco: Not on file  . Alcohol Use: Yes     pt drinks liquor daily. Pt last consume etoh @1  hour ago      Review of Systems  Constitutional: Negative for fever.  HENT: Negative for sore throat and rhinorrhea.   Eyes: Negative for redness.  Respiratory: Negative for cough.   Cardiovascular: Negative for chest pain.  Gastrointestinal: Positive for nausea. Negative for vomiting, abdominal pain and diarrhea.  Genitourinary: Negative for dysuria.  Musculoskeletal: Negative for myalgias.  Skin: Negative for rash.  Neurological: Negative for headaches.  Psychiatric/Behavioral: Negative for suicidal ideas.    Allergies  Review of patient's allergies indicates no known allergies.  Home Medications   Current Outpatient Rx  Name Route Sig Dispense Refill  . ALBUTEROL SULFATE HFA 108 (90 BASE) MCG/ACT IN AERS Inhalation Inhale 2 puffs into the lungs every 6 (six) hours as needed. For shortness of breath    . CITALOPRAM  HYDROBROMIDE 10 MG PO TABS Oral Take 3 tablets (30 mg total) by mouth daily. For anxiety 90 tablet 0  . NALTREXONE HCL 50 MG PO TABS Oral Take 50 mg by mouth daily as needed. Anti-craving medication    . QUETIAPINE FUMARATE 25 MG PO TABS Oral Take 25 mg by mouth 2 (two) times daily as needed. Mood.    . TRAZODONE HCL 50 MG PO TABS Oral Take 50 mg by mouth at bedtime as needed. Sleep.    Marland Kitchen QUETIAPINE FUMARATE 25 MG PO TABS Oral Take 1 tablet (25 mg total) by mouth 2 (two) times daily. For anxiety 60 tablet 0  . TRAZODONE HCL 50 MG PO TABS Oral Take 1 tablet (50 mg total) by mouth at bedtime. For sleep 30 tablet 0    BP 131/95  Pulse 95  Temp 98.3 F (36.8 C) (Oral)  Resp 18  SpO2 100%  Physical Exam  Nursing note and vitals reviewed. Constitutional: He appears well-developed and well-nourished.  HENT:  Head: Normocephalic and atraumatic.  Eyes: Conjunctivae normal are normal. Right eye exhibits no discharge. Left eye exhibits no discharge.  Neck: Normal range of motion. Neck supple.  Cardiovascular: Normal rate, regular rhythm and normal heart sounds.   Pulmonary/Chest: Effort normal and breath sounds normal.  Abdominal: Soft. There is no tenderness.  Neurological: He is alert.  Skin: Skin is warm and dry.  Psychiatric: He has a normal mood and affect.    ED Course  Procedures (including critical care time)  Labs Reviewed  COMPREHENSIVE METABOLIC PANEL -  Abnormal; Notable for the following:    Glucose, Bld 109 (*)     AST 52 (*)     ALT 66 (*)     All other components within normal limits  ETHANOL - Abnormal; Notable for the following:    Alcohol, Ethyl (B) 399 (*)     All other components within normal limits  SALICYLATE LEVEL - Abnormal; Notable for the following:    Salicylate Lvl <2.0 (*)     All other components within normal limits  ACETAMINOPHEN LEVEL  CBC  URINE RAPID DRUG SCREEN (HOSP PERFORMED)   No results found.   1. Alcohol abuse     11:04 PM Patient  seen and examined. Work-up initiated. Medications ordered.   Vital signs reviewed and are as follows: Filed Vitals:   10/16/11 2140  BP: 131/95  Pulse: 95  Temp: 98.3 F (36.8 C)  Resp: 18   11:48 PM Spoke with ACT who will see. Orders completed.   MDM  Pending ACT eval, CIWA initiated.         Renne Crigler, [place] 10/17/11 [phone]

## 2011-10-16 NOTE — ED Notes (Signed)
Pt brought over from ED and taken to room 27 and made comfortable.

## 2011-10-17 ENCOUNTER — Ambulatory Visit (HOSPITAL_COMMUNITY)
Admission: EM | Admit: 2011-10-17 | Payer: No Typology Code available for payment source | Source: Ambulatory Visit | Admitting: Psychiatry

## 2011-10-17 ENCOUNTER — Encounter (HOSPITAL_COMMUNITY): Payer: Self-pay | Admitting: *Deleted

## 2011-10-17 MED ORDER — CITALOPRAM HYDROBROMIDE 10 MG PO TABS
30.0000 mg | ORAL_TABLET | Freq: Every day | ORAL | Status: DC
  Administered 2011-10-17 – 2011-10-18 (×2): 30 mg via ORAL
  Filled 2011-10-17 (×3): qty 1

## 2011-10-17 MED ORDER — TRAZODONE HCL 50 MG PO TABS
50.0000 mg | ORAL_TABLET | Freq: Every evening | ORAL | Status: DC | PRN
  Administered 2011-10-17 (×2): 50 mg via ORAL
  Filled 2011-10-17 (×2): qty 1

## 2011-10-17 NOTE — Progress Notes (Addendum)
CSW spoke with pt at bedside regarding discharge plans. Pt states that his mother is unable to provide transportation in the morning. However, pt understands he is being discharged tomorrow if medically stable. Pt plans to go to [facility] or [facility] where patient has been accepted for substance abuse treatment.  Pt is undecided at this time which treatment center he will go to. Pt plans to discuss further with pt mother tomorrow after being discharged.   .No further Clinical Social Work needs, signing off.   Catha Gosselin, LCSWA  [phone] 10/17/2011 8:55pm

## 2011-10-17 NOTE — BH Assessment (Signed)
Assessment Note   Ian [name] is a 31 y.o. male who presents to [facility] with req for detox from alcohol.  Pt denies SI/HI/Psych.  Pt reports relapse in June 2013 due to family and personal issues.  Pt says he tired of drinking and unable to function and "ready to get clean".  Pt drinks: 1/5- 1/2 gal daily, last drink 10/16/11(drank 1/5).  Pt denies any other substance use.  Pt c/o w/d sxs: agitation and anxiety, no other sxs at this time and denies issues with seizures and blackouts, however pt says he's experienced substance-induced hallucinations approx 5 mos ago--"It was a disaster, laying in bed seeing and hearing things".  Reports some paranoia with drinking.  Pt has a pending DUI, no court date--"lawyer is taking care of it."    Axis I: Alcohol Abuse Axis II: Deferred Axis III:  Past Medical History  Diagnosis Date  . Anxiety   . Depression   . Hypertension    Axis IV: other psychosocial or environmental problems, problems related to social environment and problems with primary support group Axis V: 41-50 serious symptoms  Past Medical History:  Past Medical History  Diagnosis Date  . Anxiety   . Depression   . Hypertension     No past surgical history on file.  Family History: No family history on file.  Social History:  reports that he has been smoking.  He does not have any smokeless tobacco history on file. He reports that he drinks alcohol. He reports that he does not use illicit drugs.  Additional Social History:  Alcohol / Drug Use Pain Medications: None  Prescriptions: None  Over the Counter: None  History of alcohol / drug use?: Yes Longest period of sobriety (when/how long): 30 Days  Negative Consequences of Use: Personal relationships Withdrawal Symptoms: Agitation (Anxiety ) Substance #1 Name of Substance 1: Alcohol  1 - Age of First Use: 16 YOM  1 - Amount (size/oz): 1/5- 1/2 Gal  1 - Frequency: Daily  1 - Duration: On-going  1 - Last Use / Amount:  10/16/11  CIWA: CIWA-Ar BP: 119/83 mmHg Pulse Rate: 83  Nausea and Vomiting: no nausea and no vomiting Tactile Disturbances: none Tremor: no tremor Auditory Disturbances: not present Paroxysmal Sweats: no sweat visible Visual Disturbances: not present Anxiety: two Headache, Fullness in Head: none present Agitation: two Orientation and Clouding of Sensorium: oriented and can do serial additions CIWA-Ar Total: 4  COWS:    Allergies: No Known Allergies  Home Medications:  (Not in a hospital admission)  OB/GYN Status:  No LMP for male patient.  General Assessment Data [facility] of Assessment: [facility] Living Arrangements: Alone Can pt return to current living arrangement?: Yes Admission Status: Voluntary Is patient capable of signing voluntary admission?: Yes Transfer from: Acute Hospital Referral Source: MD  Education Status Is patient currently in school?: No Current Grade: None  Highest grade of school patient has completed: None  Name of school: None  Contact person: None   Risk to self Suicidal Ideation: No Suicidal Intent: No Is patient at risk for suicide?: No Suicidal Plan?: No Access to Means: No What has been your use of drugs/alcohol within the last 12 months?: Abusing: Alcohol  Previous Attempts/Gestures: No How many times?: 0  Other Self Harm Risks: None  Triggers for Past Attempts: None known Intentional Self Injurious Behavior: None Family Suicide History: No Recent stressful life event(s): Other (Comment) (SA Relapse ) Persecutory voices/beliefs?: No Depression: Yes Depression Symptoms: Loss  of interest in usual pleasures Substance abuse history and/or treatment for substance abuse?: Yes Suicide prevention information given to non-admitted patients: Not applicable  Risk to Others Homicidal Ideation: No Thoughts of Harm to Others: No Current Homicidal Intent: No Current Homicidal Plan: No Access to Homicidal Means: No Identified Victim: None    History of harm to others?: No Assessment of Violence: None Noted Violent Behavior Description: None  Does patient have access to weapons?: No Criminal Charges Pending?: Yes Describe Pending Criminal Charges: DUI(Pending)  Does patient have a court date: No  Psychosis Hallucinations: None noted Delusions: None noted  Mental Status Report Appear/Hygiene: Other (Comment) (Appropriate ) Eye Contact: Good Motor Activity: Unremarkable Speech: Logical/coherent Level of Consciousness: Alert Mood: Depressed Affect: Depressed Anxiety Level: Minimal Thought Processes: Coherent;Relevant Judgement: Unimpaired Orientation: Person;Place;Time;Situation Obsessive Compulsive Thoughts/Behaviors: None  Cognitive Functioning Concentration: Normal Memory: Recent Intact;Remote Intact IQ: Average Insight: Fair Impulse Control: Fair Appetite: Fair Weight Loss: 0  Weight Gain: 0  Sleep: No Change Total Hours of Sleep: 6  Vegetative Symptoms: None  ADLScreening [facility] Assessment Services) Patient's cognitive ability adequate to safely complete daily activities?: Yes Patient able to express need for assistance with ADLs?: Yes Independently performs ADLs?: Yes (appropriate for developmental age)  Abuse/Neglect [facility]) Physical Abuse: Denies Verbal Abuse: Denies Sexual Abuse: Denies  Prior Inpatient Therapy Prior Inpatient Therapy: Yes Prior Therapy Dates: 2013 Prior Therapy Facilty/Provider([facility]): [facility]  Reason for Treatment: SA  Prior Outpatient Therapy Prior Outpatient Therapy: No Prior Therapy Dates: None  Prior Therapy Facilty/Provider(s): None  Reason for Treatment: None   ADL Screening (condition at time of admission) Patient's cognitive ability adequate to safely complete daily activities?: Yes Patient able to express need for assistance with ADLs?: Yes Independently performs ADLs?: Yes (appropriate for developmental age) Weakness of Legs: None Weakness of  Arms/Hands: None  Home Assistive Devices/Equipment Home Assistive Devices/Equipment: None  Therapy Consults (therapy consults require a physician order) PT Evaluation Needed: No OT Evalulation Needed: No SLP Evaluation Needed: No Abuse/Neglect Assessment (Assessment to be complete while patient is alone) Physical Abuse: Denies Verbal Abuse: Denies Sexual Abuse: Denies Exploitation of patient/patient's resources: Denies Self-Neglect: Denies Values / Beliefs Cultural Requests During Hospitalization: None Spiritual Requests During Hospitalization: None Consults Spiritual Care Consult Needed: No Social Work Consult Needed: No Merchant navy officer (For Healthcare) Advance Directive: Patient does not have advance directive;Patient would not like information Pre-existing out of facility DNR order (yellow form or pink MOST form): No Nutrition Screen- [facility] Adult/[facility] Patient's home diet: Regular Have you recently lost weight without trying?: No Have you been eating poorly because of a decreased appetite?: No Malnutrition Screening Tool Score: 0   Additional Information 1:1 In Past 12 Months?: No CIRT Risk: No Elopement Risk: No Does patient have medical clearance?: Yes     Disposition:  Disposition Disposition of Patient: Inpatient treatment program;Referred to [facility] ) Type of inpatient treatment program: Adult Patient referred to: Other (Comment) [facility] )  On Site Evaluation by:   Reviewed with Physician:     Murrell Redden 10/17/2011 5:47 AM

## 2011-10-17 NOTE — ED Notes (Signed)
Pt c/o nausea, requesting med; informed pt will check to see what is available; pt verbalized understanding.

## 2011-10-17 NOTE — ED Notes (Signed)
Patient receive a phone call. Spoke for 5 minutes and went back to room - cooperative

## 2011-10-17 NOTE — ED Notes (Signed)
Pt informed received call from mother stating they had you a room reserved @ [facility] in [place], [place], and wanted to know if you could go now or how it was going to be tomorrow; spoke with Jessie Foot & was informed paperwork has been sent to [facility] for detox; pt informed of phone call with mother and with Jessie Foot, pt stated "I will call mother in a a little while".

## 2011-10-17 NOTE — Progress Notes (Addendum)
CSW received call from [facility] that patient can discharge to [facility] [place], [place] for substance abuse treatment. Day ED CSW/ACT will need to call [facility] at [phone]. Per facility, pt needs to be at [facility] by 10am.   Clinical social worker continuing to follow pt to assist with pt dc plans and further csw needs.    Catha Gosselin, LCSWA  [phone] .10/17/2011 1739pm

## 2011-10-17 NOTE — ED Provider Notes (Signed)
Medical screening examination/treatment/procedure(s) were performed by non-physician practitioner and as supervising physician I was immediately available for consultation/collaboration.  John-Adam Mariangel Ringley, M.D.      John-Adam Tyrisha Benninger, MD 10/17/11 0809 

## 2011-10-17 NOTE — BHH Counselor (Signed)
  Patient accepted to [facility] by Donell Sievert, PA to Dr. Georges Mouse. Room assignment  306-1.   *Please complete support paperwork prior to discharging patient from [facility].

## 2011-10-17 NOTE — Progress Notes (Signed)
CSW met with pt at bedside, to discuss pt dc plans in regards to residential inpatient treatment for substance abuse. RN informed CSW that patient mother called stating that patient can go to [facility] tomorrow for treatment once detox was completed as well as [facility]. Pt stated that he had spoken with his mother at [facility] and pt would like to go [facility] in the morning.   CSW called [facility] to confirm patient admission. CSW awaiting call back to confirm.   Catha Gosselin, LCSWA  [phone] .10/17/2011 1729pm
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
## 2011-10-17 NOTE — ED Notes (Signed)
Spoke to act team toyka, informed that patient will be discharge in am 0800, and will be picked up by mother and taken to inpatient facility by mother. Social worker is aware, patient is aware. Arrange ment made.

## 2011-10-18 NOTE — ED Provider Notes (Signed)
7:48 AM Accepted to [facility] for ETOH detox. Stable for discharge to facility. Mother picking the pt up    Lyanne Co, MD 10/18/11 [phone]

## 2011-10-18 NOTE — ED Notes (Signed)
Spoke to patient regarding plans for discharge and he made me aware that he would not be leaving until the afternoon of 10/18/2011. Patient's mother has to work and will not be able to pick up patient until after she finish working.

## 2012-11-14 ENCOUNTER — Other Ambulatory Visit: Payer: Self-pay

## 2013-01-22 ENCOUNTER — Ambulatory Visit: Payer: BC Managed Care – PPO | Admitting: Licensed Clinical Social Worker

## 2013-01-31 ENCOUNTER — Ambulatory Visit: Payer: BC Managed Care – PPO | Admitting: Licensed Clinical Social Worker

## 2013-02-21 ENCOUNTER — Emergency Department (HOSPITAL_COMMUNITY)
Admission: EM | Admit: 2013-02-21 | Discharge: 2013-02-22 | Disposition: A | Discharge status: Home / Self Care | Payer: BC Managed Care – PPO | Attending: Emergency Medicine | Admitting: Emergency Medicine

## 2013-02-21 ENCOUNTER — Encounter (HOSPITAL_COMMUNITY): Payer: Self-pay | Admitting: Emergency Medicine

## 2013-02-21 DIAGNOSIS — J45909 Unspecified asthma, uncomplicated: Secondary | ICD-10-CM | POA: Insufficient documentation

## 2013-02-21 DIAGNOSIS — R45851 Suicidal ideations: Secondary | ICD-10-CM | POA: Insufficient documentation

## 2013-02-21 DIAGNOSIS — F17200 Nicotine dependence, unspecified, uncomplicated: Secondary | ICD-10-CM | POA: Insufficient documentation

## 2013-02-21 DIAGNOSIS — F3289 Other specified depressive episodes: Secondary | ICD-10-CM | POA: Insufficient documentation

## 2013-02-21 DIAGNOSIS — F10229 Alcohol dependence with intoxication, unspecified: Secondary | ICD-10-CM | POA: Insufficient documentation

## 2013-02-21 DIAGNOSIS — F329 Major depressive disorder, single episode, unspecified: Secondary | ICD-10-CM | POA: Insufficient documentation

## 2013-02-21 DIAGNOSIS — F1020 Alcohol dependence, uncomplicated: Secondary | ICD-10-CM

## 2013-02-21 DIAGNOSIS — F32A Depression, unspecified: Secondary | ICD-10-CM

## 2013-02-21 DIAGNOSIS — F411 Generalized anxiety disorder: Secondary | ICD-10-CM | POA: Insufficient documentation

## 2013-02-21 DIAGNOSIS — Z79899 Other long term (current) drug therapy: Secondary | ICD-10-CM | POA: Insufficient documentation

## 2013-02-21 DIAGNOSIS — I10 Essential (primary) hypertension: Secondary | ICD-10-CM | POA: Insufficient documentation

## 2013-02-21 DIAGNOSIS — IMO0002 Reserved for concepts with insufficient information to code with codable children: Secondary | ICD-10-CM | POA: Insufficient documentation

## 2013-02-21 HISTORY — DX: Unspecified asthma, uncomplicated: J45.909

## 2013-02-21 LAB — COMPREHENSIVE METABOLIC PANEL
ALK PHOS: 73 U/L (ref 39–117)
ALT: 40 U/L (ref 0–53)
AST: 30 U/L (ref 0–37)
Albumin: 4 g/dL (ref 3.5–5.2)
BILIRUBIN TOTAL: 0.2 mg/dL — AB (ref 0.3–1.2)
BUN: 20 mg/dL (ref 6–23)
CHLORIDE: 102 meq/L (ref 96–112)
CO2: 22 meq/L (ref 19–32)
Calcium: 8.6 mg/dL (ref 8.4–10.5)
Creatinine, Ser: 0.88 mg/dL (ref 0.50–1.35)
GFR calc non Af Amer: >90 mL/min (ref >90)
GLUCOSE: 142 mg/dL — AB (ref 70–99)
POTASSIUM: 3.8 meq/L (ref 3.7–5.3)
SODIUM: 143 meq/L (ref 137–147)
Total Protein: 7.7 g/dL (ref 6.0–8.3)

## 2013-02-21 LAB — CBC
HEMATOCRIT: 45.8 % (ref 39.0–52.0)
HEMOGLOBIN: 15.8 g/dL (ref 13.0–17.0)
MCH: 31.7 pg (ref 26.0–34.0)
MCHC: 34.5 g/dL (ref 30.0–36.0)
MCV: 92 fL (ref 78.0–100.0)
Platelets: 309 10*3/uL (ref 150–400)
RBC: 4.98 MIL/uL (ref 4.22–5.81)
RDW: 13.3 % (ref 11.5–15.5)
WBC: 6.7 10*3/uL (ref 4.0–10.5)

## 2013-02-21 LAB — ETHANOL: Alcohol, Ethyl (B): 296 mg/dL — ABNORMAL HIGH (ref 0–11)

## 2013-02-21 LAB — RAPID URINE DRUG SCREEN, HOSP PERFORMED
Amphetamines: NOT DETECTED
BARBITURATES: NOT DETECTED
Benzodiazepines: NOT DETECTED
Cocaine: NOT DETECTED
Opiates: NOT DETECTED
Tetrahydrocannabinol: NOT DETECTED

## 2013-02-21 LAB — ACETAMINOPHEN LEVEL

## 2013-02-21 LAB — SALICYLATE LEVEL: Salicylate Lvl: <2 mg/dL — ABNORMAL LOW (ref 2.8–20.0)

## 2013-02-21 MED ORDER — LORAZEPAM 1 MG PO TABS
0.0000 mg | ORAL_TABLET | Freq: Four times a day (QID) | ORAL | Status: DC
  Administered 2013-02-21: 2 mg via ORAL
  Filled 2013-02-21: qty 2

## 2013-02-21 MED ORDER — ONDANSETRON HCL 4 MG PO TABS: 4.0000 mg | ORAL_TABLET | Freq: Three times a day (TID) | ORAL | Status: DC | PRN

## 2013-02-21 MED ORDER — IBUPROFEN 200 MG PO TABS: 600.0000 mg | ORAL_TABLET | Freq: Three times a day (TID) | ORAL | Status: DC | PRN

## 2013-02-21 MED ORDER — ALUM & MAG HYDROXIDE-SIMETH 200-200-20 MG/5ML PO SUSP: 30.0000 mL | ORAL | Status: DC | PRN

## 2013-02-21 MED ORDER — NICOTINE 21 MG/24HR TD PT24
21.0000 mg | MEDICATED_PATCH | Freq: Every day | TRANSDERMAL | Status: DC
  Filled 2013-02-21: qty 1

## 2013-02-21 MED ORDER — LORAZEPAM 1 MG PO TABS: 0.0000 mg | ORAL_TABLET | Freq: Two times a day (BID) | ORAL | Status: DC

## 2013-02-21 NOTE — BH Assessment (Signed)
Assessment Note  Ian [name] is an 33 y.o. male. Pt presents to [facility] with C/O medical clearance. Pt is requesting detox from etoh.  Pt  reports that he is presenting for help with his drinking today because he can't stop drinking. Pt reports that he consumes  of Gallon of Bourbon or Vodka daily for the past 10-12 years. Pt reports that  he recently relapsed on Etoh around Thanksgiving after being sober for 18 months prior.  Pt reports that he has thought about drinking himself to death in the past but reports that he does not actually have any intentions of dying that way. Pt reports that part of his motivation for getting sober is his 41 year old son. Pt denies current or history of abusing  any other substances. Pt reports a history of alcohol withdraw related DT's  1.5-2 years ago. Pt currently reports withdraw symptoms to include nausea and the shakes. Pt reports that he consumed his last drink of etoh on 02-21-13 around 2pm reporting and unknown amount.   Pt reports stressors related to his alcohol addiction to include legal issues with his ex-girlfriend regarding finances. 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  Pt reports relationship conflict with his uncle who currently resides with him who also drinks alcohol, but " not as much as I do" per patient's report. Pt reports that he sometimes loses his balance when he is intoxicated after drinking " a lot", which is frequently as patient reports that he drinks daily. Pt reports an extensive history of substance abuse treatment to include prior detox at [facility] in 06/2011, [facility] 2011, [facility]2011, and [facility] 2014(?).  Pt presents restless and fatique . Pt is cooperative during assessment. Pt does not appear to be experiencing any delusions or psychotic symptoms. Pt denies current SI,HI, and no  AVH reported. Inpatient recommended as patient does meet inpatient detox criteria. Consulted with  Susann Givens Psychiatric extender at [facility] who is recommending inpatient treatment . [facility] has no available beds and patient will need to be referred to other facilities. EDP Dr. [name] has been notified of this plan.  Axis I: 303.9 Alcohol Use Disorder, Severe Axis II: Deferred Axis III:  Past Medical History  Diagnosis Date  . Anxiety   . Depression   . Hypertension   . Asthma    Axis IV: economic problems, occupational problems, other psychosocial or environmental problems, problems related to legal system/crime, problems related to social environment and problems with primary support group Axis V: 31-40 impairment in reality testing  Past Medical History:  Past Medical History  Diagnosis Date  . Anxiety   . Depression   . Hypertension   . Asthma     History reviewed. No pertinent past surgical history.  Family History: History reviewed. No pertinent family history.  Social History:  reports that he has been smoking Cigarettes.  He has been smoking about 0.00 packs per day. He does not have any smokeless tobacco history on file. He reports that he drinks alcohol. He reports that he does not use illicit drugs.  Additional Social History:  Alcohol / Drug Use History of alcohol / drug use?: Yes Substance #1 Name of Substance 1:  (Etoh-Vodka and Bourbon) 1 - Age of First Use:  (unknown) 1 - Amount (size/oz):  (1/2 Gallon) 1 - Frequency:  (Daily) 1 - Duration:  (on-going,  past 10-12 yrs with 18 months of sobriety at some point ) 1 - Last Use / Amount:  (02-21-13/2pm-uknown amount)  CIWA: CIWA-Ar BP: 124/82 mmHg Pulse Rate: 100 Nausea and Vomiting: 2 Tactile Disturbances: none Tremor: two Auditory Disturbances: not present Paroxysmal Sweats: barely perceptible sweating, palms moist Visual Disturbances: not present Anxiety: three Headache, Fullness in Head: none  present Agitation: three Orientation and Clouding of Sensorium: oriented and can do serial additions CIWA-Ar Total: 11 COWS:    Allergies: No Known Allergies  Home Medications:  (Not in a hospital admission)  OB/GYN Status:  No LMP for male patient.  General Assessment Data Location of Assessment: [facility] Is this a Tele or Face-to-Face Assessment?: Face-to-Face Is this an Initial Assessment or a Re-assessment for this encounter?: Initial Assessment Living Arrangements: Alone (Pt reports his uncle recently started living w/him) Can pt return to current living arrangement?: Yes Admission Status: Voluntary Is patient capable of signing voluntary admission?: Yes Transfer from: Home Referral Source: Other ([facility]/MD)     [facility] Living Arrangements: Alone (Pt reports his uncle recently started living w/him) Name of Psychiatrist: No Current Provider Name of Therapist: No Current Provider     Risk to self Suicidal Ideation: No Suicidal Intent: No Is patient at risk for suicide?: No Suicidal Plan?: No Access to Means: No What has been your use of drugs/alcohol within the last 12 months?: na Previous Attempts/Gestures: No How many times?: 0 Other Self Harm Risks: none reported Triggers for Past Attempts: None known Intentional Self Injurious Behavior: None Family Suicide History: No (Pt reports family hx of OCD) Recent stressful life event(s): Conflict (Comment);Financial Problems;Legal Issues Persecutory voices/beliefs?: No Depression: Yes Depression Symptoms: Fatigue;Guilt;Loss of interest in usual pleasures Substance abuse history and/or treatment for substance abuse?: Yes Suicide prevention information given to non-admitted patients: Not applicable  Risk to Others Homicidal Ideation: No Thoughts of Harm to Others: No Current Homicidal Intent: No Current Homicidal Plan: No Access to Homicidal Means: No Identified Victim: na History of harm to others?:  No Assessment of Violence: None Noted Violent Behavior Description: None Noted,Cooperative during TTS assessment Does patient have access to weapons?: No Criminal Charges Pending?: No Does patient have a court date: No  Psychosis Hallucinations: None noted Delusions: None noted  Mental Status Report Appear/Hygiene: Other (Comment) (Appropriate) Eye Contact: Good Motor Activity: Freedom of movement Speech: Logical/coherent Level of Consciousness: Alert Mood: Anxious Affect: Anxious Anxiety Level: Minimal Thought Processes: Coherent;Relevant Judgement: Impaired Orientation: Person;Place;Time;Situation Obsessive Compulsive Thoughts/Behaviors: None  Cognitive Functioning Concentration: Normal Memory: Recent Intact;Remote Intact IQ: Average Insight: Fair Impulse Control: Poor Appetite: Poor Weight Loss: 0 Weight Gain: 0 Sleep: No Change Total Hours of Sleep:  (varies) Vegetative Symptoms: None  ADLScreening [facility] Assessment Services) Patient's cognitive ability adequate to safely complete daily activities?: Yes Patient able to express need for assistance with ADLs?: Yes Independently performs ADLs?: Yes (appropriate for developmental age)  Prior Inpatient Therapy Prior Inpatient Therapy: Yes Prior Therapy Dates: 2011,2013,2014 Prior Therapy Facilty/Provider(s): [facility], [facility] Reason for Treatment: Detox, Residential SA Treatment  Prior Outpatient Therapy Prior Outpatient Therapy: No Prior Therapy Dates: a Prior Therapy Facilty/Provider(s): na Reason for Treatment: na  ADL Screening (condition at time of admission) Patient's cognitive ability adequate to safely complete daily activities?: Yes Is the patient deaf or have difficulty hearing?: No Does the patient have difficulty seeing, even when wearing glasses/contacts?: No (Pt wears eyeglasses) Does the patient have difficulty concentrating, remembering, or making  decisions?: No Patient able to express need for assistance with ADLs?: Yes Does the patient have difficulty dressing or bathing?: No Independently performs ADLs?: Yes (appropriate for developmental age) Does the patient have difficulty walking or climbing stairs?: No Weakness of Legs: None Weakness of Arms/Hands: None  Home Assistive Devices/Equipment Home Assistive Devices/Equipment: None    Abuse/Neglect Assessment (Assessment to be complete while patient is alone) Physical Abuse: Denies Verbal Abuse: Denies Sexual Abuse: Denies Exploitation of patient/patient's resources: Denies Self-Neglect: Denies Values / Beliefs Cultural Requests During Hospitalization: None Spiritual Requests During Hospitalization: None   Advance Directives (For Healthcare) Advance Directive: Patient does not have advance directive;Patient would not like information    Additional Information 1:1 In Past 12 Months?: No CIRT Risk: No Elopement Risk: No Does patient have medical clearance?: Yes     Disposition:  Disposition Initial Assessment Completed for this Encounter: Yes Disposition of Patient: Inpatient treatment program (Pt needs to be run by [facility] when bed available) Type of inpatient treatment program: Adult  On Site Evaluation by:   Reviewed with Physician:    Gerline Legacy, MS, LCASA Assessment Counselor  02/21/2013 11:08 PM

## 2013-02-21 NOTE — ED Provider Notes (Signed)
CSN: [id number]     Arrival date & time 02/21/13  1901 History   First MD Initiated Contact with Patient 02/21/13 1934     Chief Complaint  Patient presents with  . Medical Clearance     (Consider location/radiation/quality/duration/timing/severity/associated sxs/prior Treatment) The history is provided by the patient.   Patient here complaining of suicidal ideations with plan to drink himself to death. History of alcohol abuse and drinks approximately 0.5 L of onset per day. Patient was sober for 8 years until 3 months ago his surgery he again. Denies any illicit drug use. No prior history of suicide attempt. Denies any auditory or visual hallucinations. Denies abdominal pain or black or bloody stools. Symptoms have been worsening. Nothing makes them better worse and no treatment used prior to arrival. He denies any intentional ingestions at this time Past Medical History  Diagnosis Date  . Anxiety   . Depression   . Hypertension   . Asthma    History reviewed. No pertinent past surgical history. History reviewed. No pertinent family history. History  Substance Use Topics  . Smoking status: Current Some Day Smoker    Types: Cigarettes  . Smokeless tobacco: Not on file  . Alcohol Use: Yes     Comment: pt drinks liquor daily. Pt last consume etoh @1  hour ago    Review of Systems  All other systems reviewed and are negative.      Allergies  Review of patient's allergies indicates no known allergies.  Home Medications   Current Outpatient Rx  Name  Route  Sig  Dispense  Refill  . albuterol (PROVENTIL HFA;VENTOLIN HFA) 108 (90 BASE) MCG/ACT inhaler   Inhalation   Inhale 2 puffs into the lungs every 6 (six) hours as needed. For shortness of breath         . cyclobenzaprine (FLEXERIL) 10 MG tablet   Oral   Take 10 mg by mouth at bedtime as needed for muscle spasms.         . Fluticasone-Salmeterol (ADVAIR DISKUS) 250-50 MCG/DOSE AEPB   Inhalation   Inhale 1 puff  into the lungs 2 (two) times daily.         . hydrOXYzine (ATARAX/VISTARIL) 50 MG tablet   Oral   Take 50 mg by mouth 3 (three) times daily as needed for anxiety.         Marland Kitchen EXPIRED: QUEtiapine (SEROQUEL) 25 MG tablet   Oral   Take 1 tablet (25 mg total) by mouth 2 (two) times daily. For anxiety   60 tablet   0   . QUEtiapine (SEROQUEL) 25 MG tablet   Oral   Take 25 mg by mouth 2 (two) times daily as needed. Mood.         Marland Kitchen EXPIRED: traZODone (DESYREL) 50 MG tablet   Oral   Take 1 tablet (50 mg total) by mouth at bedtime. For sleep   30 tablet   0    BP 151/103  Pulse 106  Temp(Src) 98 F (36.7 C) (Oral)  Resp 17  SpO2 93% Physical Exam  Nursing note and vitals reviewed. Constitutional: He is oriented to person, place, and time. He appears well-developed and well-nourished.  Non-toxic appearance. No distress.  HENT:  Head: Normocephalic and atraumatic.  Eyes: Conjunctivae, EOM and lids are normal. Pupils are equal, round, and reactive to light.  Neck: Normal range of motion. Neck supple. No tracheal deviation present. No mass present.  Cardiovascular: Normal rate, regular rhythm and normal heart  sounds.  Exam reveals no gallop.   No murmur heard. Pulmonary/Chest: Effort normal and breath sounds normal. No stridor. No respiratory distress. He has no decreased breath sounds. He has no wheezes. He has no rhonchi. He has no rales.  Abdominal: Soft. Normal appearance and bowel sounds are normal. He exhibits no distension. There is no tenderness. There is no rebound and no CVA tenderness.  Musculoskeletal: Normal range of motion. He exhibits no edema and no tenderness.  Neurological: He is alert and oriented to person, place, and time. He has normal strength. No cranial nerve deficit or sensory deficit. GCS eye subscore is 4. GCS verbal subscore is 5. GCS motor subscore is 6.  Skin: Skin is warm and dry. No abrasion and no rash noted.  Psychiatric: He has a normal mood and  affect. His speech is normal and behavior is normal. He expresses suicidal ideation. He expresses suicidal plans.    ED Course  Procedures (including critical care time) Labs Review Labs Reviewed  ACETAMINOPHEN LEVEL  CBC  COMPREHENSIVE METABOLIC PANEL  ETHANOL  SALICYLATE LEVEL  URINE RAPID DRUG SCREEN (HOSP PERFORMED)   Imaging Review No results found.  EKG Interpretation   None       MDM   Final diagnoses:  None   Patient to be medically cleared and then seen by psychiatry.    Toy BakerAnthony T Kelvin Burpee, MD 02/21/13 2026

## 2013-02-21 NOTE — BH Assessment (Signed)
TTS writer attempted to contact EDP at [phone] and was unable to reach provider to obtain clinicals. TTS will attempt to reach EDP at a later time.   Glorious PeachNajah Haddie Bruhl, MS, LCASA Assessment Counselor

## 2013-02-21 NOTE — ED Notes (Signed)
Pt transferred from triage, presents with complaint of SI, plan to drink self to death, last drink 4 hrs ago.  Admits to drinking 1/2 gal of vodka.  Denies HI or AV hallucinations.  Feeling hopeless, with anxiety and depression noted.  Pt cooperative but anxious.

## 2013-02-21 NOTE — BH Assessment (Signed)
Consulted with EDP Dr. Freida BusmanAllen Face/Face to consult with him about patient at 2130, after prior attempt was made at 2043. Consulted with Susann GivensFran Hobson,NP [facility] who recommends inpatient treatment as patient meets inpatient criteria. [facility] have no available beds and patient will need to be referred to other facilities.  Ian PeachNajah Reza Crymes, MS, LCASA Assessment Counselor

## 2013-02-21 NOTE — ED Notes (Signed)
Pt here requesting detox from ETOH and SI - pt was reports 18 months sobriety until he began drinking again during the holidays, [facility]. Pt drinks approx 1/2gal of vodka daily, last drink approx 2hrs pta.

## 2013-02-22 ENCOUNTER — Emergency Department (HOSPITAL_COMMUNITY): Payer: BC Managed Care – PPO

## 2013-02-22 DIAGNOSIS — F1020 Alcohol dependence, uncomplicated: Secondary | ICD-10-CM

## 2013-02-22 MED ORDER — LOPERAMIDE HCL 2 MG PO CAPS: 2.0000 mg | ORAL_CAPSULE | ORAL | Status: DC | PRN

## 2013-02-22 MED ORDER — CHLORDIAZEPOXIDE HCL 25 MG PO CAPS: 25.0000 mg | ORAL_CAPSULE | Freq: Every day | ORAL | Status: DC

## 2013-02-22 MED ORDER — CHLORDIAZEPOXIDE HCL 25 MG PO CAPS
50.0000 mg | ORAL_CAPSULE | Freq: Once | ORAL | Status: AC
Start: 2013-02-22 — End: 2013-02-22
  Administered 2013-02-22: 50 mg via ORAL
  Filled 2013-02-22: qty 2

## 2013-02-22 MED ORDER — CHLORDIAZEPOXIDE HCL 25 MG PO CAPS: 25.0000 mg | ORAL_CAPSULE | ORAL | Status: DC

## 2013-02-22 MED ORDER — CHLORDIAZEPOXIDE HCL 25 MG PO CAPS
25.0000 mg | ORAL_CAPSULE | Freq: Four times a day (QID) | ORAL | Status: DC
  Administered 2013-02-22 (×2): 25 mg via ORAL
  Filled 2013-02-22 (×2): qty 1

## 2013-02-22 MED ORDER — HYDROXYZINE HCL 25 MG PO TABS
25.0000 mg | ORAL_TABLET | Freq: Once | ORAL | Status: AC
  Administered 2013-02-22: 25 mg via ORAL
  Filled 2013-02-22: qty 1

## 2013-02-22 MED ORDER — CHLORDIAZEPOXIDE HCL 25 MG PO CAPS: 25.0000 mg | ORAL_CAPSULE | Freq: Four times a day (QID) | ORAL | Status: DC | PRN

## 2013-02-22 MED ORDER — DIVALPROEX SODIUM 500 MG PO DR TAB
500.0000 mg | DELAYED_RELEASE_TABLET | Freq: Two times a day (BID) | ORAL | Status: DC
  Administered 2013-02-22: 500 mg via ORAL
  Filled 2013-02-22: qty 1

## 2013-02-22 MED ORDER — HYDROXYZINE HCL 25 MG PO TABS
25.0000 mg | ORAL_TABLET | Freq: Four times a day (QID) | ORAL | Status: DC | PRN
  Administered 2013-02-22: 25 mg via ORAL
  Filled 2013-02-22: qty 1

## 2013-02-22 MED ORDER — CHLORDIAZEPOXIDE HCL 25 MG PO CAPS: 25.0000 mg | ORAL_CAPSULE | Freq: Three times a day (TID) | ORAL | Status: DC

## 2013-02-22 MED ORDER — ADULT MULTIVITAMIN W/MINERALS CH
1.0000 | ORAL_TABLET | Freq: Every day | ORAL | Status: DC
  Administered 2013-02-22: 1 via ORAL
  Filled 2013-02-22: qty 1

## 2013-02-22 MED ORDER — THIAMINE HCL 100 MG/ML IJ SOLN
100.0000 mg | Freq: Once | INTRAMUSCULAR | Status: AC
  Administered 2013-02-22: 100 mg via INTRAMUSCULAR
  Filled 2013-02-22: qty 2

## 2013-02-22 MED ORDER — ONDANSETRON 4 MG PO TBDP: 4.0000 mg | ORAL_TABLET | Freq: Four times a day (QID) | ORAL | Status: DC | PRN

## 2013-02-22 MED ORDER — VITAMIN B-1 100 MG PO TABS: 100.0000 mg | ORAL_TABLET | Freq: Every day | ORAL | Status: DC

## 2013-02-22 MED ORDER — CITALOPRAM HYDROBROMIDE 40 MG PO TABS
40.0000 mg | ORAL_TABLET | Freq: Every day | ORAL | Status: DC
  Administered 2013-02-22: 40 mg via ORAL
  Filled 2013-02-22: qty 1

## 2013-02-22 NOTE — ED Notes (Signed)
Dr James into see 

## 2013-02-22 NOTE — ED Notes (Signed)
[facility], will notify EDP

## 2013-02-22 NOTE — Progress Notes (Addendum)
The following facilities have been contacted regarding inptx and bed availability on pts behalf:  [facility]- per Eber Jonesarolyn bed is available and requested pt information be faxed, referral faxed [facility]- per Jae DireKate, RN doing D/c will call TTS back with bed status.   1350 [facility] back [facility] [place] [facility] and requested information be faxed, referral has been faxed [facility]- per Joni ReiningNicole do not have a bed for pt   [facility] Disposition [facility]

## 2013-02-22 NOTE — ED Notes (Signed)
Psych md and josephine np into see 

## 2013-02-22 NOTE — Consult Note (Signed)
[facility] [place] Face-to-Face Psychiatry Consult   Reason for Consult: Alcohol Dependence Referring Physician:  EDP ABDIRAHIM FLAVELL is an 33 y.o. male. Total Time spent with patient: 45 minutes  Assessment: AXIS I:  Alcohol Dependence AXIS II:  Deferred AXIS III:   Past Medical History  Diagnosis Date  . Anxiety   . Depression   . Hypertension   . Asthma    AXIS IV:  other psychosocial or environmental problems, problems related to social environment and problems with primary support group AXIS V:  31-40 impairment in reality testing  Plan:  Recommend psychiatric Inpatient admission when medically cleared.  Subjective:   HUSAIN COSTABILE is a 33 y.o. male patient evaluated for alcohol dependence and intoxication.Marland Kitchen  HPI:  Caucasian male 33 years old came in seeking alcohol detoxification and treatment of his depression.  Patient states he was sober for some time but relapsed over the holidays.  Patient drinks half a gallon of Vodka daily to manage his stress.  reports stressors related to his alcohol addiction to include legal issues with his ex-girlfriend regarding finances. 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 Pt reports relationship conflict with his uncle who currently resides with him who also drinks alcohol. Pt reports that he sometimes loses his balance when he is intoxicated after drinking and that he drinks until he passes's out " a lot.   Pt reports an extensive history of substance abuse treatment to include prior detox at [facility] in 06/2011, [facility] 2011, [facility] 10/2011, and [facility] 2014.  Patient started drinking at age 71 but did not start drinking heavily until his teenage years.  He denies Alcohol withdrawal seizures.  He denies SI/HI/AVH but reports feeling jittery, anxious and nauseated.  Patient reports a hx of depression/Bipolar  and that he takes medications for that.    He has a son who lives with his mother and that he want treatment now for him to be able to take care of his son.  We have accepted him for admission and we will be looking for placement at any hospital with available bed.  We are at capacity here at [facility].  HPI Elements:   Location:  Alcohol dependent. Quality:  Depressed, anxious, streesed ou. Severity:  Relapsed, uncontrolled drinking. Duration:  Since Januarry 2015.Marland Kitchen  Past Psychiatric History: Past Medical History  Diagnosis Date  . Anxiety   . Depression   . Hypertension   . Asthma     reports that he has been smoking Cigarettes.  He has been smoking about 0.00 packs per day. He does not have any smokeless tobacco history on file. He reports that he drinks alcohol. He reports that he does not use illicit drugs. History reviewed. No pertinent family history. Family History Substance Abuse: No Family Supports: Yes, List: (Parents) Living Arrangements: Alone (Pt reports his uncle recently started living w/him) Can pt return to current living arrangement?: Yes Abuse/Neglect [facility]) Physical Abuse: Denies Verbal Abuse: Denies Sexual Abuse: Denies Allergies:  No Known Allergies  ACT Assessment Complete:  Yes:    Educational Status    Risk to Self: Risk to self Suicidal Ideation: No Suicidal Intent: No Is patient at risk for suicide?: No Suicidal Plan?: No Access to Means: No What has been your use of drugs/alcohol within the last 12 months?: na Previous Attempts/Gestures: No How many times?: 0 Other Self Harm Risks:  none reported Triggers for Past Attempts: None known Intentional Self Injurious Behavior: None Family Suicide History: No (Pt reports family hx of OCD) Recent stressful life event(s): Conflict (Comment);Financial Problems;Legal Issues Persecutory voices/beliefs?: No Depression: Yes Depression Symptoms: Fatigue;Guilt;Loss of interest in usual pleasures Substance  abuse history and/or treatment for substance abuse?: Yes Suicide prevention information given to non-admitted patients: Not applicable  Risk to Others: Risk to Others Homicidal Ideation: No Thoughts of Harm to Others: No Current Homicidal Intent: No Current Homicidal Plan: No Access to Homicidal Means: No Identified Victim: na History of harm to others?: No Assessment of Violence: None Noted Violent Behavior Description: None Noted,Cooperative during TTS assessment Does patient have access to weapons?: No Criminal Charges Pending?: No Does patient have a court date: No  Abuse: Abuse/Neglect Assessment (Assessment to be complete while patient is alone) Physical Abuse: Denies Verbal Abuse: Denies Sexual Abuse: Denies Exploitation of patient/patient's resources: Denies Self-Neglect: Denies  Prior Inpatient Therapy: Prior Inpatient Therapy Prior Inpatient Therapy: Yes Prior Therapy Dates: 2011,2013,2014 Prior Therapy Facilty/Provider(s): [facility], [facility], and [facility] Reason for Treatment: Detox, [facility]idential [facility] Treatment  Prior Outpatient Therapy: Prior Outpatient Therapy Prior Outpatient Therapy: No Prior Therapy Dates: a Prior Therapy Facilty/Provider(s): na Reason for Treatment: na  Additional Information: Additional Information 1:1 In Past 12 Months?: No CIRT Risk: No Elopement Risk: No Does patient have medical clearance?: Yes                  Objective: Blood pressure 107/70, pulse 101, temperature 97.2 F (36.2 C), temperature source Oral, resp. rate 16, SpO2 94.00%.There is no weight on file to calculate BMI. Results for orders placed during the hospital encounter of 02/21/13 (from the past 72 hour(s))  ACETAMINOPHEN LEVEL     Status: None   Collection Time    02/21/13  8:03 PM      Result Value Ref Range   Acetaminophen (Tylenol), Serum <15.0  10 - 30 ug/mL   Comment:            THERAPEUTIC CONCENTRATIONS VARY      SIGNIFICANTLY. A RANGE OF 10-30     ug/mL MAY BE AN EFFECTIVE     CONCENTRATION FOR MANY PATIENTS.     HOWEVER, SOME ARE BEST TREATED     AT CONCENTRATIONS OUTSIDE THIS     RANGE.     ACETAMINOPHEN CONCENTRATIONS     >150 ug/mL AT 4 HOURS AFTER     INGESTION AND >50 ug/mL AT 12     HOURS AFTER INGESTION ARE     OFTEN ASSOCIATED WITH TOXIC     REACTIONS.  CBC     Status: None   Collection Time    02/21/13  8:03 PM      Result Value Ref Range   WBC 6.7  4.0 - 10.5 K/uL   RBC 4.98  4.22 - 5.81 MIL/uL   Hemoglobin 15.8  13.0 - 17.0 g/dL   HCT 45.8  39.0 - 52.0 %   MCV 92.0  78.0 - 100.0 fL   MCH 31.7  26.0 - 34.0 pg   MCHC 34.5  30.0 - 36.0 g/dL   RDW 13.3  11.5 - 15.5 %   Platelets 309  150 - 400 K/uL  COMPREHENSIVE METABOLIC PANEL     Status: Abnormal   Collection Time    02/21/13  8:03 PM      Result Value Ref Range   Sodium 143  137 - 147 mEq/L  Potassium 3.8  3.7 - 5.3 mEq/L   Chloride 102  96 - 112 mEq/L   CO2 22  19 - 32 mEq/L   Glucose, Bld 142 (*) 70 - 99 mg/dL   BUN 20  6 - 23 mg/dL   Creatinine, Ser 0.88  0.50 - 1.35 mg/dL   Calcium 8.6  8.4 - 10.5 mg/dL   Total Protein 7.7  6.0 - 8.3 g/dL   Albumin 4.0  3.5 - 5.2 g/dL   AST 30  0 - 37 U/L   Comment: SLIGHT HEMOLYSIS   ALT 40  0 - 53 U/L   Alkaline Phosphatase 73  39 - 117 U/L   Total Bilirubin 0.2 (*) 0.3 - 1.2 mg/dL   GFR calc non Af Amer >90  >90 mL/min   GFR calc Af Amer >90  >90 mL/min   Comment: (NOTE)     The eGFR has been calculated using the CKD EPI equation.     This calculation has not been validated in all clinical situations.     eGFR's persistently <90 mL/min signify possible Chronic Kidney     Disease.  ETHANOL     Status: Abnormal   Collection Time    02/21/13  8:03 PM      Result Value Ref Range   Alcohol, Ethyl (B) 296 (*) 0 - 11 mg/dL   Comment:            LOWEST DETECTABLE LIMIT FOR     SERUM ALCOHOL IS 11 mg/dL     FOR MEDICAL PURPOSES ONLY  SALICYLATE LEVEL     Status: Abnormal    Collection Time    02/21/13  8:03 PM      Result Value Ref Range   Salicylate Lvl <1.8 (*) 2.8 - 20.0 mg/dL  URINE RAPID DRUG SCREEN (HOSP PERFORMED)     Status: None   Collection Time    02/21/13  9:56 PM      Result Value Ref Range   Opiates NONE DETECTED  NONE DETECTED   Cocaine NONE DETECTED  NONE DETECTED   Benzodiazepines NONE DETECTED  NONE DETECTED   Amphetamines NONE DETECTED  NONE DETECTED   Tetrahydrocannabinol NONE DETECTED  NONE DETECTED   Barbiturates NONE DETECTED  NONE DETECTED   Comment:            DRUG SCREEN FOR MEDICAL PURPOSES     ONLY.  IF CONFIRMATION IS NEEDED     FOR ANY PURPOSE, NOTIFY LAB     WITHIN 5 DAYS.                LOWEST DETECTABLE LIMITS     FOR URINE DRUG SCREEN     Drug Class       Cutoff (ng/mL)     Amphetamine      1000     Barbiturate      200     Benzodiazepine   563     Tricyclics       149     Opiates          300     Cocaine          300     THC              50   Labs are reviewed and are pertinent for Unremarkable except for Alcohol level of 296.  Current Facility-Administered Medications  Medication Dose Route Frequency Provider Last Rate Last Dose  . alum &  mag hydroxide-simeth (MAALOX/MYLANTA) 200-200-20 MG/5ML suspension 30 mL  30 mL Oral PRN Leota Jacobsen, MD      . chlordiazePOXIDE (LIBRIUM) capsule 25 mg  25 mg Oral Q6H PRN Lurena Nida, NP      . chlordiazePOXIDE (LIBRIUM) capsule 25 mg  25 mg Oral QID Lurena Nida, NP   25 mg at 02/22/13 0945   Followed by  . [START ON 02/23/2013] chlordiazePOXIDE (LIBRIUM) capsule 25 mg  25 mg Oral TID Lurena Nida, NP       Followed by  . [START ON 02/24/2013] chlordiazePOXIDE (LIBRIUM) capsule 25 mg  25 mg Oral BH-qamhs Lurena Nida, NP       Followed by  . [START ON 02/25/2013] chlordiazePOXIDE (LIBRIUM) capsule 25 mg  25 mg Oral Daily Lurena Nida, NP      . hydrOXYzine (ATARAX/VISTARIL) tablet 25 mg  25 mg Oral Q6H PRN Lurena Nida, NP   25 mg at 02/22/13 0630  .  ibuprofen (ADVIL,MOTRIN) tablet 600 mg  600 mg Oral Q8H PRN Leota Jacobsen, MD      . loperamide (IMODIUM) capsule 2-4 mg  2-4 mg Oral PRN Lurena Nida, NP      . multivitamin with minerals tablet 1 tablet  1 tablet Oral Daily Lurena Nida, NP   1 tablet at 02/22/13 0944  . nicotine (NICODERM CQ - dosed in mg/24 hours) patch 21 mg  21 mg Transdermal Daily Leota Jacobsen, MD      . ondansetron [facility]) tablet 4 mg  4 mg Oral Q8H PRN Leota Jacobsen, MD      . ondansetron (ZOFRAN-ODT) disintegrating tablet 4 mg  4 mg Oral Q6H PRN Lurena Nida, NP      . Derrill Memo ON 02/23/2013] thiamine (VITAMIN B-1) tablet 100 mg  100 mg Oral Daily Lurena Nida, NP       Current Outpatient Prescriptions  Medication Sig Dispense Refill  . albuterol (PROVENTIL HFA;VENTOLIN HFA) 108 (90 BASE) MCG/ACT inhaler Inhale 2 puffs into the lungs every 6 (six) hours as needed. For shortness of breath      . cyclobenzaprine (FLEXERIL) 10 MG tablet Take 10 mg by mouth at bedtime as needed for muscle spasms.      . Fluticasone-Salmeterol (ADVAIR DISKUS) 250-50 MCG/DOSE AEPB Inhale 1 puff into the lungs 2 (two) times daily.      . hydrOXYzine (ATARAX/VISTARIL) 50 MG tablet Take 50 mg by mouth 3 (three) times daily as needed for anxiety.      Marland Kitchen QUEtiapine (SEROQUEL) 25 MG tablet Take 1 tablet (25 mg total) by mouth 2 (two) times daily. For anxiety  60 tablet  0  . QUEtiapine (SEROQUEL) 25 MG tablet Take 25 mg by mouth 2 (two) times daily as needed. Mood.      . traZODone (DESYREL) 50 MG tablet Take 1 tablet (50 mg total) by mouth at bedtime. For sleep  30 tablet  0   Review of Physical examination performed by the EDP 02/21/2013 is unremarkable. Psychiatric Specialty Exam:     Blood pressure 107/70, pulse 101, temperature 97.2 F (36.2 C), temperature source Oral, resp. rate 16, SpO2 94.00%.There is no weight on file to calculate BMI.  General Appearance: Casual and Disheveled  Eye Contact::  Fair  Speech:  Clear and  Coherent  Volume:  Normal  Mood:  Anxious, Depressed, Hopeless and Worthless  Affect:  Congruent, Depressed and Flat  Thought Process:  Coherent  and Goal Directed  Orientation:  Full (Time, Place, and Person)  Thought Content:  NA  Suicidal Thoughts:  No  Homicidal Thoughts:  No  Memory:  Immediate;   Good Recent;   Good Remote;   Fair  Judgement:  Poor  Insight:  Good  Psychomotor Activity:  Tremor  Concentration:  Fair  Recall:  NA  Fund of Knowledge:Good  Language: Good  Akathisia:  NA  Handed:  Right  AIMS (if indicated):     Assets:  Desire for Improvement  Sleep:      Musculoskeletal: Strength & Muscle Tone: within normal limits Gait & Station: normal Patient leans: N/A  Treatment Plan Summary:  Consult and face to face interview with Dr Louretta Shorten We  Have determined that patient need inpatient admission We will seek placement at any hospital with available beds because we are at capacity now Patient is already placed on our Librium protocol. Daily contact with patient to assess and evaluate symptoms and progress in treatment Medication management  Hermine Messick   PMHNP-BC 02/22/2013 12:43 PM  Patient was seen face-to-face for this psychiatric evaluation, suicide risk assessment and case discussed with the physician extender and formulated treatment plan. Reviewed the information documented and agree with the treatment plan.  Abdo Denault,JANARDHAHA R. 02/22/2013 7:46 PM

## 2013-02-22 NOTE — ED Notes (Signed)
Pt in xray

## 2013-02-22 NOTE — ED Notes (Signed)
NAD, resting quietly.  Pt reports that the SOB is worse, nad, BS CTA bilat.

## 2013-02-22 NOTE — ED Notes (Signed)
DrJames updated and will eval

## 2013-02-22 NOTE — ED Notes (Signed)
Pt has been accepted to [facility]-they will call back for report

## 2013-02-22 NOTE — BH Assessment (Signed)
Per Kelly Southard, AC at [facility], adult unit is at capacity. Contacted the following facilities for placement:   [facility]- Bed available. Faxed clinical information.  [facility]- Bed available. Faxed clinical information.  [facility]- Bed available. Faxed clinical information.  [facility]- Bed available. Faxed clinical information.  [facility]- Bed available. Faxed clinical information.   [facility]- At capacity  [facility]- Left voicemail. No response.  [facility]- At capacity  [facility]- At capacity  [facility]- At capacity  [facility]- At capacity  [facility]- At capacity    Anelly Samarin Ellis Kalli Greenfield Jr, LPC, NCC  Triage Specialist  

## 2013-02-22 NOTE — ED Notes (Signed)
Feeling better, has been able to sleep some, pain free, calm

## 2013-02-22 NOTE — ED Notes (Signed)
Up to the desk  On the phone

## 2013-02-22 NOTE — ED Notes (Signed)
Up to the bathroom 

## 2013-02-22 NOTE — ED Provider Notes (Signed)
Asked to see patient regarding  "shortness of breath".  Per patient, he feels "nervous and SOB".  He states that he has felt this way with withdrawal before.  Was previously on Ativan, but [facility]  Exam:  HR 101.  98% sats on RA.  Lungs clear.  Normal cardiac exam with exception of HR 102.  Pt anxious.  EKG:  Sinus rhythm.  No ectopy or ischemic changes.  CXR with mild bronchitc changes, no infiltrate, no cardiomegaly, no effusion.    Discussion:  Pt with anxiety and ETOH withdrawal.  Low risk for ACS.  No PE risks.  Plan[facility]  [facility].  Rolland PorterMark Mieke Brinley, MD 02/22/13 1340

## 2013-02-22 NOTE — ED Notes (Signed)
Pt reports that [facility] is [facility] of [facility] and is requesting to stay here tonight and have his sponsor pick him up tomorrow. [facility] TTS aware-refer to EDP, edp  Aware, [facility].

## 2013-02-22 NOTE — Discharge Instructions (Signed)
Finding Treatment for Alcohol and Drug Addiction It can be hard to find the right place to get professional treatment. Here are some important things to consider:  There are different types of treatment to choose from.  Some programs are live-in (residential) while others are not (outpatient). Sometimes a combination is offered.  No single type of program is right for everyone.  Most treatment programs involve a combination of education, counseling, and a 12-step, spiritually-based approach.  There are non-spiritually based programs (not 12-step).  Some treatment programs are government sponsored. They are geared for patients without private insurance.  Treatment programs can vary in many respects such as:  Cost and types of insurance accepted.  Types of on-site medical services offered.  Length of stay, setting, and size.  Overall philosophy of treatment. A person may need specialized treatment or have needs not addressed by all programs. For example, adolescents need treatment appropriate for their age. Other people have secondary disorders that must be managed as well. Secondary conditions can include mental illness, such as depression or diabetes. Often, a period of detoxification from alcohol or drugs is needed. This requires medical supervision and not all programs offer this. THINGS TO CONSIDER WHEN SELECTING A TREATMENT PROGRAM   Is the program certified by the appropriate government agency? Even private programs must be certified and employ certified professionals.  Does the program accept your insurance? If not, can a payment plan be set up?  Is the facility clean, organized, and well run? Do they allow you to speak with graduates who can share their treatment experience with you? Can you tour the facility? Can you meet with staff?  Does the program meet the full range of individual needs?  Does the treatment program address sexual orientation and physical disabilities?  Do they provide age, gender, and culturally appropriate treatment services?  Is treatment available in languages other than English?  Is long-term aftercare support or guidance encouraged and provided?  Is assessment of an individual's treatment plan ongoing to ensure it meets changing needs?  Does the program use strategies to encourage reluctant patients to remain in treatment long enough to increase the likelihood of success?  Does the program offer counseling (individual or group) and other behavioral therapies?  Does the program offer medicine as part of the treatment regimen, if needed?  Is there ongoing monitoring of possible relapse? Is there a defined relapse prevention program? Are services or referrals offered to family members to ensure they understand addiction and the recovery process? This would help them support the recovering individual.  Are 12-step meetings held at the center or is transport available for patients to attend outside meetings? In countries outside of the [place] and [place], [employer], [employer] for contact information for services in your area. Document Released: 11/24/2004 Document Revised: 03/20/2011 Document Reviewed: 06/06/2007 [facility] Patient Information 2014 [place].  Alcohol Withdrawal Alcohol withdrawal happens when you normally drink alcohol a lot and suddenly stop drinking. Alcohol withdrawal symptoms can be mild to very bad. Mild withdrawal symptoms can include feeling sick to your stomach (nauseous), headache, or feeling irritable. Bad withdrawal symptoms can include shakiness, being very nervous (anxious), and not thinking clearly.  HOME CARE  Join an alcohol support group.  Stay away from people or situations that make you want to drink.  Eat a healthy diet. Eat a lot of fresh fruits, vegetables, and lean meats. GET HELP RIGHT AWAY IF:   You become confused. You start to see and hear things  that are not really there.  You feel  your heart beating very fast.  You throw up (vomit) blood or cannot stop throwing up. This may be bright red or look like black coffee grounds.  You have blood in your poop (stool). This may be bright red, maroon colored, or black and tarry.  You are lightheaded or pass out (faint).  You develop a fever. MAKE SURE YOU:   Understand these instructions.  Will watch your condition.  Will get help right away if you are not doing well or get worse. Document Released: 06/14/2007 Document Revised: 03/20/2011 Document Reviewed: 06/14/2007 [facility] Patient Information 2014 [place].  Referral information provided for outpatient alcohol and substance abuse problems.

## 2013-02-22 NOTE — ED Notes (Signed)
In the bathroom

## 2013-02-22 NOTE — ED Provider Notes (Signed)
Patient seen by me. Patient had placement [facility] but now states he does not want to go he wants to leave. Patient has no suicidal or homicidal ideations currently or as per behavioral health note. Patient will be given outpatient information about alcohol detox.  Shelda JakesScott W. Dorethia Jeanmarie, MD 02/22/13 [phone]

## 2013-02-22 NOTE — ED Notes (Signed)
Pt wi going to contact his sponsor to discuss [facility] w/ him.

## 2013-02-22 NOTE — ED Notes (Signed)
Dr Fayrene FearingJames updated about chest pain EKG ordered

## 2013-02-22 NOTE — Progress Notes (Signed)
Received phone call from [facility], per Annie, RN pt has been accepted and will provide transportation to pick pt up.  Pts RN was called and given update on pts status as well as report number.   Atul Delucia Disposition MHT  

## 2013-04-22 ENCOUNTER — Ambulatory Visit (HOSPITAL_COMMUNITY): Payer: Self-pay | Admitting: Psychiatry

## 2013-06-24 ENCOUNTER — Ambulatory Visit (HOSPITAL_COMMUNITY): Payer: Self-pay | Admitting: Psychiatry

## 2013-08-14 ENCOUNTER — Ambulatory Visit (INDEPENDENT_AMBULATORY_CARE_PROVIDER_SITE_OTHER): Payer: BC Managed Care – PPO | Admitting: Psychiatry

## 2013-08-14 ENCOUNTER — Encounter (HOSPITAL_COMMUNITY): Payer: Self-pay | Admitting: Psychiatry

## 2013-08-14 VITALS — BP 118/96 | HR 81 | Ht 72.0 in | Wt 223.8 lb

## 2013-08-14 DIAGNOSIS — F1021 Alcohol dependence, in remission: Secondary | ICD-10-CM | POA: Insufficient documentation

## 2013-08-14 DIAGNOSIS — Z720 Tobacco use: Secondary | ICD-10-CM | POA: Insufficient documentation

## 2013-08-14 DIAGNOSIS — F329 Major depressive disorder, single episode, unspecified: Secondary | ICD-10-CM

## 2013-08-14 DIAGNOSIS — Z79899 Other long term (current) drug therapy: Secondary | ICD-10-CM

## 2013-08-14 DIAGNOSIS — F411 Generalized anxiety disorder: Secondary | ICD-10-CM | POA: Insufficient documentation

## 2013-08-14 DIAGNOSIS — F331 Major depressive disorder, recurrent, moderate: Secondary | ICD-10-CM | POA: Insufficient documentation

## 2013-08-14 DIAGNOSIS — F17200 Nicotine dependence, unspecified, uncomplicated: Secondary | ICD-10-CM

## 2013-08-14 MED ORDER — CITALOPRAM HYDROBROMIDE 40 MG PO TABS: 40.0000 mg | ORAL_TABLET | Freq: Every day | ORAL | Status: DC

## 2013-08-14 MED ORDER — HYDROXYZINE HCL 50 MG PO TABS: 50.0000 mg | ORAL_TABLET | Freq: Three times a day (TID) | ORAL | Status: DC | PRN

## 2013-08-14 MED ORDER — NALTREXONE HCL 50 MG PO TABS: 25.0000 mg | ORAL_TABLET | Freq: Every day | ORAL | Status: DC

## 2013-08-14 MED ORDER — TRAZODONE HCL 100 MG PO TABS: 100.0000 mg | ORAL_TABLET | Freq: Every evening | ORAL | Status: DC | PRN

## 2013-08-14 MED ORDER — DIVALPROEX SODIUM 500 MG PO DR TAB: 500.0000 mg | DELAYED_RELEASE_TABLET | Freq: Two times a day (BID) | ORAL | Status: DC
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
## 2013-08-15 LAB — CBC WITH DIFFERENTIAL/PLATELET
Basophils Absolute: 0.1 10*3/uL (ref 0.0–0.1)
Basophils Relative: 1 % (ref 0–1)
EOS ABS: 0.3 10*3/uL (ref 0.0–0.7)
Eosinophils Relative: 3 % (ref 0–5)
HCT: 42.4 % (ref 39.0–52.0)
Hemoglobin: 14.9 g/dL (ref 13.0–17.0)
LYMPHS ABS: 3.1 10*3/uL (ref 0.7–4.0)
Lymphocytes Relative: 31 % (ref 12–46)
MCH: 31.3 pg (ref 26.0–34.0)
MCHC: 35.1 g/dL (ref 30.0–36.0)
MCV: 89.1 fL (ref 78.0–100.0)
MONO ABS: 0.8 10*3/uL (ref 0.1–1.0)
Monocytes Relative: 8 % (ref 3–12)
Neutro Abs: 5.6 10*3/uL (ref 1.7–7.7)
Neutrophils Relative %: 57 % (ref 43–77)
PLATELETS: 235 10*3/uL (ref 150–400)
RBC: 4.76 MIL/uL (ref 4.22–5.81)
RDW: 13.7 % (ref 11.5–15.5)
WBC: 9.9 10*3/uL (ref 4.0–10.5)

## 2013-08-16 LAB — COMPLETE METABOLIC PANEL WITH GFR
ALT: 27 U/L (ref 0–53)
AST: 20 U/L (ref 0–37)
Albumin: 5 g/dL (ref 3.5–5.2)
Alkaline Phosphatase: 61 U/L (ref 39–117)
BUN: 13 mg/dL (ref 6–23)
CO2: 24 meq/L (ref 19–32)
CREATININE: 0.83 mg/dL (ref 0.50–1.35)
Calcium: 9.5 mg/dL (ref 8.4–10.5)
Chloride: 103 mEq/L (ref 96–112)
GLUCOSE: 109 mg/dL — AB (ref 70–99)
Potassium: 4.1 mEq/L (ref 3.5–5.3)
Sodium: 139 mEq/L (ref 135–145)
Total Bilirubin: 0.5 mg/dL (ref 0.2–1.2)
Total Protein: 7.5 g/dL (ref 6.0–8.3)

## 2013-08-16 LAB — LIPID PANEL
Cholesterol: 153 mg/dL (ref 0–200)
HDL: 36 mg/dL — ABNORMAL LOW (ref >39)
LDL CALC: 70 mg/dL (ref 0–99)
Total CHOL/HDL Ratio: 4.3 Ratio
Triglycerides: 235 mg/dL — ABNORMAL HIGH (ref <150)
VLDL: 47 mg/dL — ABNORMAL HIGH (ref 0–40)

## 2013-08-16 LAB — DRUG SCREEN, URINE
Amphetamine Screen, Ur: NEGATIVE
BARBITURATE QUANT UR: NEGATIVE
Benzodiazepines.: POSITIVE — AB
CREATININE, U: 146.29 mg/dL
Cocaine Metabolites: NEGATIVE
METHADONE: NEGATIVE
Marijuana Metabolite: NEGATIVE
Opiates: NEGATIVE
Phencyclidine (PCP): NEGATIVE
Propoxyphene: NEGATIVE

## 2013-08-16 LAB — HEMOGLOBIN A1C
HEMOGLOBIN A1C: 5.5 % (ref <5.7)
Mean Plasma Glucose: 111 mg/dL (ref <117)

## 2013-08-16 LAB — GAMMA GT: GGT: 103 U/L — ABNORMAL HIGH (ref 7–51)

## 2013-08-16 LAB — TSH: TSH: 1.715 u[IU]/mL (ref 0.350–4.500)

## 2013-08-21 ENCOUNTER — Encounter (HOSPITAL_COMMUNITY): Payer: Self-pay | Admitting: Emergency Medicine

## 2013-08-21 ENCOUNTER — Emergency Department (HOSPITAL_COMMUNITY)
Admission: EM | Admit: 2013-08-21 | Discharge: 2013-08-22 | Disposition: A | Discharge status: Home / Self Care | Payer: Self-pay | Attending: Emergency Medicine | Admitting: Emergency Medicine

## 2013-08-21 DIAGNOSIS — IMO0002 Reserved for concepts with insufficient information to code with codable children: Secondary | ICD-10-CM | POA: Insufficient documentation

## 2013-08-21 DIAGNOSIS — F17200 Nicotine dependence, unspecified, uncomplicated: Secondary | ICD-10-CM | POA: Insufficient documentation

## 2013-08-21 DIAGNOSIS — Z79899 Other long term (current) drug therapy: Secondary | ICD-10-CM | POA: Insufficient documentation

## 2013-08-21 DIAGNOSIS — F10931 Alcohol use, unspecified with withdrawal delirium: Secondary | ICD-10-CM | POA: Insufficient documentation

## 2013-08-21 DIAGNOSIS — F3289 Other specified depressive episodes: Secondary | ICD-10-CM | POA: Insufficient documentation

## 2013-08-21 DIAGNOSIS — I10 Essential (primary) hypertension: Secondary | ICD-10-CM | POA: Insufficient documentation

## 2013-08-21 DIAGNOSIS — F10231 Alcohol dependence with withdrawal delirium: Secondary | ICD-10-CM | POA: Insufficient documentation

## 2013-08-21 DIAGNOSIS — F1010 Alcohol abuse, uncomplicated: Secondary | ICD-10-CM | POA: Insufficient documentation

## 2013-08-21 DIAGNOSIS — F411 Generalized anxiety disorder: Secondary | ICD-10-CM | POA: Insufficient documentation

## 2013-08-21 DIAGNOSIS — F10121 Alcohol abuse with intoxication delirium: Secondary | ICD-10-CM

## 2013-08-21 DIAGNOSIS — F331 Major depressive disorder, recurrent, moderate: Secondary | ICD-10-CM

## 2013-08-21 DIAGNOSIS — F10229 Alcohol dependence with intoxication, unspecified: Secondary | ICD-10-CM | POA: Insufficient documentation

## 2013-08-21 DIAGNOSIS — J45909 Unspecified asthma, uncomplicated: Secondary | ICD-10-CM | POA: Insufficient documentation

## 2013-08-21 DIAGNOSIS — F329 Major depressive disorder, single episode, unspecified: Secondary | ICD-10-CM | POA: Insufficient documentation

## 2013-08-21 DIAGNOSIS — F1021 Alcohol dependence, in remission: Secondary | ICD-10-CM

## 2013-08-21 LAB — CBC WITH DIFFERENTIAL/PLATELET
Basophils Absolute: 0.1 10*3/uL (ref 0.0–0.1)
Basophils Relative: 1 % (ref 0–1)
EOS PCT: 2 % (ref 0–5)
Eosinophils Absolute: 0.2 10*3/uL (ref 0.0–0.7)
HEMATOCRIT: 45.1 % (ref 39.0–52.0)
Hemoglobin: 15.5 g/dL (ref 13.0–17.0)
LYMPHS ABS: 4.9 10*3/uL — AB (ref 0.7–4.0)
LYMPHS PCT: 62 % — AB (ref 12–46)
MCH: 31.1 pg (ref 26.0–34.0)
MCHC: 34.4 g/dL (ref 30.0–36.0)
MCV: 90.6 fL (ref 78.0–100.0)
MONO ABS: 0.7 10*3/uL (ref 0.1–1.0)
Monocytes Relative: 9 % (ref 3–12)
NEUTROS ABS: 2 10*3/uL (ref 1.7–7.7)
Neutrophils Relative %: 25 % — ABNORMAL LOW (ref 43–77)
Platelets: 299 10*3/uL (ref 150–400)
RBC: 4.98 MIL/uL (ref 4.22–5.81)
RDW: 13.1 % (ref 11.5–15.5)
WBC: 7.8 10*3/uL (ref 4.0–10.5)

## 2013-08-21 LAB — COMPREHENSIVE METABOLIC PANEL
ALT: 27 U/L (ref 0–53)
AST: 31 U/L (ref 0–37)
Albumin: 4.6 g/dL (ref 3.5–5.2)
Alkaline Phosphatase: 74 U/L (ref 39–117)
Anion gap: 22 — ABNORMAL HIGH (ref 5–15)
BILIRUBIN TOTAL: 0.4 mg/dL (ref 0.3–1.2)
BUN: 8 mg/dL (ref 6–23)
CALCIUM: 9.6 mg/dL (ref 8.4–10.5)
CHLORIDE: 104 meq/L (ref 96–112)
CO2: 22 mEq/L (ref 19–32)
Creatinine, Ser: 0.87 mg/dL (ref 0.50–1.35)
GLUCOSE: 97 mg/dL (ref 70–99)
Potassium: 3.6 mEq/L — ABNORMAL LOW (ref 3.7–5.3)
Sodium: 148 mEq/L — ABNORMAL HIGH (ref 137–147)
Total Protein: 8.2 g/dL (ref 6.0–8.3)

## 2013-08-21 LAB — ETHANOL: Alcohol, Ethyl (B): 179 mg/dL — ABNORMAL HIGH (ref 0–11)

## 2013-08-21 MED ORDER — HALOPERIDOL LACTATE 5 MG/ML IJ SOLN
5.0000 mg | Freq: Once | INTRAMUSCULAR | Status: AC
  Administered 2013-08-21: 5 mg via INTRAVENOUS
  Filled 2013-08-21: qty 1

## 2013-08-21 MED ORDER — SODIUM CHLORIDE 0.9 % IV BOLUS (SEPSIS)
1000.0000 mL | Freq: Once | INTRAVENOUS | Status: AC
  Administered 2013-08-21: 1000 mL via INTRAVENOUS

## 2013-08-21 MED ORDER — PANTOPRAZOLE SODIUM 40 MG IV SOLR
40.0000 mg | Freq: Once | INTRAVENOUS | Status: AC
  Administered 2013-08-21: 40 mg via INTRAVENOUS
  Filled 2013-08-21: qty 40

## 2013-08-21 MED ORDER — ONDANSETRON HCL 4 MG/2ML IJ SOLN: 4.0000 mg | Freq: Once | INTRAMUSCULAR | Status: DC

## 2013-08-21 NOTE — ED Provider Notes (Signed)
CSN: [id number]     Arrival date & time 08/21/13  2216 History   First MD Initiated Contact with Patient 08/21/13 2242     Chief Complaint  Patient presents with  . Alcohol Intoxication     (Consider location/radiation/quality/duration/timing/severity/associated sxs/prior Treatment) HPI Level 5 Caveat: severely intoxicated. This is a 33 year old male with a history of alcohol abuse. He has been binge drinking for the past 4 days. He is brought in by his family "severely intoxicated". He has only been able to state his name even when asked his birthday. To their knowledge his family denies other coingestants.  Past Medical History  Diagnosis Date  . Anxiety   . Depression   . Hypertension   . Asthma   . Alcohol dependence    Past Surgical History  Procedure Laterality Date  . No past surgeries     Family History  Problem Relation Age of Onset  . OCD Mother   . Suicidality Neg Hx    History  Substance Use Topics  . Smoking status: Current Some Day Smoker    Types: Cigarettes  . Smokeless tobacco: Never Used  . Alcohol Use: Yes     Comment: daily     Review of Systems  Unable to perform ROS   Allergies  Review of patient's allergies indicates no known allergies.  Home Medications   Prior to Admission medications   Medication Sig Start Date End Date Taking? Authorizing Provider  albuterol (PROVENTIL HFA;VENTOLIN HFA) 108 (90 BASE) MCG/ACT inhaler Inhale 2 puffs into the lungs every 6 (six) hours as needed. For shortness of breath 07/11/11   Alyson Kuroski-Mazzei, DO  citalopram (CELEXA) 40 MG tablet Take 1 tablet (40 mg total) by mouth daily. 08/14/13   Oletta Darter, MD  cyclobenzaprine (FLEXERIL) 10 MG tablet Take 10 mg by mouth at bedtime as needed for muscle spasms.    Historical Provider, MD  divalproex (DEPAKOTE) 500 MG DR tablet Take 1 tablet (500 mg total) by mouth 2 (two) times daily. 08/14/13   Oletta Darter, MD  Fluticasone-Salmeterol (ADVAIR DISKUS) 250-50  MCG/DOSE AEPB Inhale 1 puff into the lungs 2 (two) times daily.    Historical Provider, MD  hydrOXYzine (ATARAX/VISTARIL) 50 MG tablet Take 1 tablet (50 mg total) by mouth 3 (three) times daily as needed for anxiety. 08/14/13   Oletta Darter, MD  naltrexone (DEPADE) 50 MG tablet Take 0.5 tablets (25 mg total) by mouth daily. 08/14/13   Oletta Darter, MD  traZODone (DESYREL) 100 MG tablet Take 1 tablet (100 mg total) by mouth at bedtime as needed. For sleep 08/14/13 09/13/13  Oletta Darter, MD   BP 136/95  Pulse 90  Temp(Src) 98.8 F (37.1 C) (Oral)  Resp 20  SpO2 97%  Physical Exam General: Well-developed, well-nourished male in no acute distress; appearance consistent with age of record HENT: normocephalic; atraumatic Eyes: pupils equal, round and reactive to light Neck: supple Heart: regular rate and rhythm; no ectopy noted Lungs: clear to auscultation bilaterally Abdomen: soft; nondistended; nontender; no masses or hepatosplenomegaly; bowel sounds present Extremities: No deformity; full range of motion; pulses normal Neurologic: Awake, obviously intoxicated; motor function intact in all extremities; ataxia; no facial droop Skin: Warm and dry Psychiatric: Uncooperative; mildly combative   ED Course  Procedures (including critical care time)   MDM   Nursing notes and vitals signs, including pulse oximetry, reviewed.  Summary of this visit's results, reviewed by myself:  Labs:  Results for orders placed during the hospital encounter  of 08/21/13 (from the past 24 hour(s))  CBC WITH DIFFERENTIAL     Status: Abnormal   Collection Time    08/21/13 11:01 PM      Result Value Ref Range   WBC 7.8  4.0 - 10.5 K/uL   RBC 4.98  4.22 - 5.81 MIL/uL   Hemoglobin 15.5  13.0 - 17.0 g/dL   HCT 16.145.1  09.639.0 - 04.552.0 %   MCV 90.6  78.0 - 100.0 fL   MCH 31.1  26.0 - 34.0 pg   MCHC 34.4  30.0 - 36.0 g/dL   RDW 40.913.1  81.111.5 - 91.415.5 %   Platelets 299  150 - 400 K/uL   Neutrophils Relative % 25 (*)  43 - 77 %   Neutro Abs 2.0  1.7 - 7.7 K/uL   Lymphocytes Relative 62 (*) 12 - 46 %   Lymphs Abs 4.9 (*) 0.7 - 4.0 K/uL   Monocytes Relative 9  3 - 12 %   Monocytes Absolute 0.7  0.1 - 1.0 K/uL   Eosinophils Relative 2  0 - 5 %   Eosinophils Absolute 0.2  0.0 - 0.7 K/uL   Basophils Relative 1  0 - 1 %   Basophils Absolute 0.1  0.0 - 0.1 K/uL  COMPREHENSIVE METABOLIC PANEL     Status: Abnormal   Collection Time    08/21/13 11:01 PM      Result Value Ref Range   Sodium 148 (*) 137 - 147 mEq/L   Potassium 3.6 (*) 3.7 - 5.3 mEq/L   Chloride 104  96 - 112 mEq/L   CO2 22  19 - 32 mEq/L   Glucose, Bld 97  70 - 99 mg/dL   BUN 8  6 - 23 mg/dL   Creatinine, Ser 7.820.87  0.50 - 1.35 mg/dL   Calcium 9.6  8.4 - 95.610.5 mg/dL   Total Protein 8.2  6.0 - 8.3 g/dL   Albumin 4.6  3.5 - 5.2 g/dL   AST 31  0 - 37 U/L   ALT 27  0 - 53 U/L   Alkaline Phosphatase 74  39 - 117 U/L   Total Bilirubin 0.4  0.3 - 1.2 mg/dL   GFR calc non Af Amer >90  >90 mL/min   GFR calc Af Amer >90  >90 mL/min   Anion gap 22 (*) 5 - 15  ETHANOL     Status: Abnormal   Collection Time    08/21/13 11:01 PM      Result Value Ref Range   Alcohol, Ethyl (B) 179 (*) 0 - 11 mg/dL  URINE RAPID DRUG SCREEN (HOSP PERFORMED)     Status: None   Collection Time    08/22/13 12:25 AM      Result Value Ref Range   Opiates NONE DETECTED  NONE DETECTED   Cocaine NONE DETECTED  NONE DETECTED   Benzodiazepines NONE DETECTED  NONE DETECTED   Amphetamines NONE DETECTED  NONE DETECTED   Tetrahydrocannabinol NONE DETECTED  NONE DETECTED   Barbiturates NONE DETECTED  NONE DETECTED    11:24 PM Resting peacefully after Haldol 5 mg IV.  6:05 AM Continues to rest peacefully after given additional 2 mg of Ativan IV.  7:09 AM Dr. Silverio LayYao will further hydrate the patient and recheck laboratories. Have recommended TTS evaluation once he is awake and alert.  Hanley SeamenJohn L Hiroto Saltzman, MD 08/22/13 [phone]

## 2013-08-21 NOTE — ED Notes (Signed)
Pt presents with c/o alcohol intoxication. Pt is very obviously intoxicated and when asked his name, he continues to repeat his birthday over and over. Family said that he has been binge drinking since Monday night. Pt has a hx of alcohol abuse. Pt has a dazed look and has a hard time following simple commands at this time.

## 2013-08-22 ENCOUNTER — Emergency Department (HOSPITAL_COMMUNITY): Payer: BC Managed Care – PPO

## 2013-08-22 LAB — TROPONIN I: Troponin I: <0.3 ng/mL (ref <0.30)

## 2013-08-22 LAB — URINALYSIS, ROUTINE W REFLEX MICROSCOPIC
BILIRUBIN URINE: NEGATIVE
Glucose, UA: NEGATIVE mg/dL
Hgb urine dipstick: NEGATIVE
Ketones, ur: 15 mg/dL — AB
Leukocytes, UA: NEGATIVE
Nitrite: NEGATIVE
PH: 7 (ref 5.0–8.0)
Protein, ur: NEGATIVE mg/dL
Specific Gravity, Urine: 1.017 (ref 1.005–1.030)
UROBILINOGEN UA: 1 mg/dL (ref 0.0–1.0)

## 2013-08-22 LAB — BASIC METABOLIC PANEL
ANION GAP: 18 — AB (ref 5–15)
Anion gap: 16 — ABNORMAL HIGH (ref 5–15)
BUN: 9 mg/dL (ref 6–23)
BUN: 10 mg/dL (ref 6–23)
CALCIUM: 8.2 mg/dL — AB (ref 8.4–10.5)
CO2: 21 mEq/L (ref 19–32)
CO2: 16 mEq/L — ABNORMAL LOW (ref 19–32)
Calcium: 8.4 mg/dL (ref 8.4–10.5)
Chloride: 110 mEq/L (ref 96–112)
Chloride: 111 mEq/L (ref 96–112)
Creatinine, Ser: 0.87 mg/dL (ref 0.50–1.35)
Creatinine, Ser: 0.77 mg/dL (ref 0.50–1.35)
GFR calc Af Amer: >90 mL/min (ref >90)
GFR calc non Af Amer: >90 mL/min (ref >90)
Glucose, Bld: 81 mg/dL (ref 70–99)
Glucose, Bld: 91 mg/dL (ref 70–99)
POTASSIUM: 3.6 meq/L — AB (ref 3.7–5.3)
POTASSIUM: 4.4 meq/L (ref 3.7–5.3)
SODIUM: 149 meq/L — AB (ref 137–147)
SODIUM: 143 meq/L (ref 137–147)

## 2013-08-22 LAB — ACETAMINOPHEN LEVEL: Acetaminophen (Tylenol), Serum: <15 ug/mL (ref 10–30)

## 2013-08-22 LAB — SALICYLATE LEVEL: Salicylate Lvl: <2 mg/dL — ABNORMAL LOW (ref 2.8–20.0)

## 2013-08-22 LAB — OSMOLALITY: OSMOLALITY: 303 mosm/kg — AB (ref 275–300)

## 2013-08-22 LAB — RAPID URINE DRUG SCREEN, HOSP PERFORMED
AMPHETAMINES: NOT DETECTED
BARBITURATES: NOT DETECTED
Benzodiazepines: NOT DETECTED
Cocaine: NOT DETECTED
OPIATES: NOT DETECTED
Tetrahydrocannabinol: NOT DETECTED

## 2013-08-22 LAB — ETHANOL

## 2013-08-22 LAB — VALPROIC ACID LEVEL: VALPROIC ACID LVL: 29.1 ug/mL — AB (ref 50.0–100.0)

## 2013-08-22 LAB — MAGNESIUM: Magnesium: 1.7 mg/dL (ref 1.5–2.5)

## 2013-08-22 MED ORDER — SODIUM CHLORIDE 0.9 % IV SOLN
Freq: Once | INTRAVENOUS | Status: AC
  Administered 2013-08-22: 150 mL/h via INTRAVENOUS

## 2013-08-22 MED ORDER — NALTREXONE HCL 50 MG PO TABS: 25.0000 mg | ORAL_TABLET | Freq: Every day | ORAL | Status: DC

## 2013-08-22 MED ORDER — LORAZEPAM 2 MG/ML IJ SOLN
2.0000 mg | Freq: Once | INTRAMUSCULAR | Status: AC
  Administered 2013-08-22: 2 mg via INTRAVENOUS

## 2013-08-22 MED ORDER — LORAZEPAM 2 MG/ML IJ SOLN
INTRAMUSCULAR | Status: AC
  Filled 2013-08-22: qty 1

## 2013-08-22 MED ORDER — TRAZODONE HCL 100 MG PO TABS
100.0000 mg | ORAL_TABLET | Freq: Every evening | ORAL | Status: DC | PRN
  Filled 2013-08-22: qty 7

## 2013-08-22 MED ORDER — CITALOPRAM HYDROBROMIDE 40 MG PO TABS
40.0000 mg | ORAL_TABLET | Freq: Every day | ORAL | Status: DC
  Administered 2013-08-22: 40 mg via ORAL
  Filled 2013-08-22: qty 1

## 2013-08-22 MED ORDER — ALBUTEROL SULFATE HFA 108 (90 BASE) MCG/ACT IN AERS: 2.0000 | INHALATION_SPRAY | Freq: Four times a day (QID) | RESPIRATORY_TRACT | Status: DC | PRN

## 2013-08-22 MED ORDER — HYDROXYZINE HCL 50 MG PO TABS: 50.0000 mg | ORAL_TABLET | Freq: Three times a day (TID) | ORAL | Status: DC | PRN

## 2013-08-22 MED ORDER — SODIUM CHLORIDE 0.9 % IV BOLUS (SEPSIS)
1000.0000 mL | Freq: Once | INTRAVENOUS | Status: AC
  Administered 2013-08-22: 1000 mL via INTRAVENOUS

## 2013-08-22 MED ORDER — DIVALPROEX SODIUM 500 MG PO DR TAB
500.0000 mg | DELAYED_RELEASE_TABLET | Freq: Two times a day (BID) | ORAL | Status: DC
  Administered 2013-08-22: 500 mg via ORAL
  Filled 2013-08-22: qty 1
  Filled 2013-08-22: qty 14
  Filled 2013-08-22: qty 1

## 2013-08-22 MED ORDER — CYCLOBENZAPRINE HCL 10 MG PO TABS
10.0000 mg | ORAL_TABLET | Freq: Every evening | ORAL | Status: DC | PRN
  Filled 2013-08-22: qty 7

## 2013-08-22 MED ORDER — DIVALPROEX SODIUM 500 MG PO DR TAB: 500.0000 mg | DELAYED_RELEASE_TABLET | Freq: Two times a day (BID) | ORAL | Status: DC

## 2013-08-22 MED ORDER — ACETAMINOPHEN 325 MG PO TABS: 650.0000 mg | ORAL_TABLET | ORAL | Status: DC | PRN

## 2013-08-22 MED ORDER — LORAZEPAM 1 MG PO TABS
0.0000 mg | ORAL_TABLET | Freq: Four times a day (QID) | ORAL | Status: DC
  Administered 2013-08-22: 1 mg via ORAL
  Administered 2013-08-22: 2 mg via ORAL
  Filled 2013-08-22: qty 1
  Filled 2013-08-22: qty 2

## 2013-08-22 MED ORDER — CITALOPRAM HYDROBROMIDE 40 MG PO TABS: 40.0000 mg | ORAL_TABLET | Freq: Every day | ORAL | Status: DC

## 2013-08-22 MED ORDER — THIAMINE HCL 100 MG/ML IJ SOLN: 100.0000 mg | Freq: Every day | INTRAMUSCULAR | Status: DC

## 2013-08-22 MED ORDER — TRAZODONE HCL 100 MG PO TABS: 100.0000 mg | ORAL_TABLET | Freq: Every evening | ORAL | Status: DC | PRN

## 2013-08-22 MED ORDER — VITAMIN B-1 100 MG PO TABS
100.0000 mg | ORAL_TABLET | Freq: Every day | ORAL | Status: DC
  Administered 2013-08-22: 100 mg via ORAL
  Filled 2013-08-22: qty 1

## 2013-08-22 MED ORDER — LORAZEPAM 1 MG PO TABS: 1.0000 mg | ORAL_TABLET | Freq: Three times a day (TID) | ORAL | Status: DC | PRN

## 2013-08-22 MED ORDER — MOMETASONE FURO-FORMOTEROL FUM 100-5 MCG/ACT IN AERO: 2.0000 | INHALATION_SPRAY | Freq: Two times a day (BID) | RESPIRATORY_TRACT | Status: DC

## 2013-08-22 MED ORDER — NALTREXONE HCL 50 MG PO TABS
25.0000 mg | ORAL_TABLET | Freq: Every day | ORAL | Status: DC
  Administered 2013-08-22: 25 mg via ORAL
  Filled 2013-08-22: qty 1

## 2013-08-22 MED ORDER — CYCLOBENZAPRINE HCL 10 MG PO TABS: 10.0000 mg | ORAL_TABLET | Freq: Every evening | ORAL | Status: DC | PRN

## 2013-08-22 MED ORDER — LORAZEPAM 1 MG PO TABS: 0.0000 mg | ORAL_TABLET | Freq: Two times a day (BID) | ORAL | Status: DC

## 2013-08-22 MED ORDER — IBUPROFEN 200 MG PO TABS
600.0000 mg | ORAL_TABLET | Freq: Three times a day (TID) | ORAL | Status: DC | PRN
  Administered 2013-08-22: 600 mg via ORAL
  Filled 2013-08-22: qty 3

## 2013-08-22 MED ORDER — HYDROXYZINE HCL 25 MG PO TABS: 50.0000 mg | ORAL_TABLET | Freq: Three times a day (TID) | ORAL | Status: DC | PRN

## 2013-08-22 MED ORDER — MOMETASONE FURO-FORMOTEROL FUM 100-5 MCG/ACT IN AERO
2.0000 | INHALATION_SPRAY | Freq: Two times a day (BID) | RESPIRATORY_TRACT | Status: DC
  Administered 2013-08-22: 2 via RESPIRATORY_TRACT
  Filled 2013-08-22: qty 8.8

## 2013-08-22 NOTE — ED Notes (Signed)
Assisted pt by calling two phone numbers. One got a voicemail and the second got pt's dad, Ron, on the phone. Pt speaking to father at this time regarding, pt puts father on the phone the phone.  Pt's Father, Ferne ReusRon, can be reached at [phone].

## 2013-08-22 NOTE — ED Notes (Signed)
Pt refused breakfast 
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
## 2013-08-22 NOTE — ED Notes (Signed)
Poison Control called following up on pt's lab work and EKG.    They are closing pt's case at this time

## 2013-08-22 NOTE — ED Provider Notes (Addendum)
  Physical Exam  BP 165/84  Pulse 42  Temp(Src) 97.9 F (36.6 C) (Oral)  Resp 15  SpO2 91%  Physical Exam  ED Course  Procedures  Care assumed at sign out from Dr. Read DriversMolpus. Patient came for alcohol intoxicated and was answering only his name. His alcohol level was 179, but was often higher. He was noted to be Na 148 with AG 22. Sign out to reassess patient. On my initial exam, patient very tired, responsive to sternal rub. Unable to give history. Will get CT head given AMS and only minimally elevated alcohol level. Will hydrate and likely call poison control.   10 AM Repeat BMP after 2 L showed Na 149 with AG 18. Osm 303, no osmolar gap. EKG showed some prolonged QTc 525 but he was given haldol last night. Still tired. Poison control recommend more fluid resuscitation and check tylenol, salicylate levels and recheck BMP in several hours.   11 AM Patient more awake now. CT head nl. Will call TTS for eval. EKG showed QT not prolonged.   1:30 PM After another NS bolus, Na 143 with AG 16. Much more alert. Can be transferred to psych ED pending TTS eval. I doubt toxic alcohol at this point.    Richardean Canalavid H Yao, MD 08/22/13 1332  Richardean Canalavid H Yao, MD 08/22/13 [phone]

## 2013-08-22 NOTE — ED Notes (Signed)
BMP to be drawn after IV fluids

## 2013-08-22 NOTE — ED Notes (Signed)
TTS at bedside. Pt was calling parents to speak with them on making a decision about wanting detox rehab for his ETOH.

## 2013-08-22 NOTE — Progress Notes (Signed)
[facility] Community Liaison Stacy,  ° °Provided pt with a list of primary care resources and a [facility] [employer] Card application to help patient establish primary care.  °

## 2013-08-22 NOTE — BH Assessment (Addendum)
Assessment Note  Ian [name] is an 33 y.o. male who was brought by family members intoxicated, and Dr. Silverio Lay put in a TTS consult to see if he wants detox since pt was ambivalent with Dr. Silverio Lay.  Pt is a poor historian due to not feeling very good at this time and seems sleepy, but was able to answer most questions. Pt's judgement at this time is questionable due to not feeling well, and he did not give much detail in his interview.  Please see clinical assessment below.   Pt says he drank 3 gallons of vodka this week after being sober for a couple of months.  He said he had been going to meetings daily and going to [facility], but relapsed. Pt called his family and discussed it and says he does want detox at this time.  Pt says he is depressed, but denies SI, HI, A/V hallucinations.  He says he lives alone alone and works for [employer] currently. His mood appears depressed, but pt was cooperative with slow speech and restless movements.  His thoughts were logical and coherent, and his appearance is disheveled.  Pt denies any history of seizures with withdrawal.  Nanine Means recommends inpatient detox.  Axis I: Alcohol Abuse Axis II: Deferred Axis III:  Past Medical History  Diagnosis Date  . Anxiety   . Depression   . Hypertension   . Asthma   . Alcohol dependence    Axis IV: other psychosocial or environmental problems Axis V: 41-50 serious symptoms  Past Medical History:  Past Medical History  Diagnosis Date  . Anxiety   . Depression   . Hypertension   . Asthma   . Alcohol dependence     Past Surgical History  Procedure Laterality Date  . No past surgeries      Family History:  Family History  Problem Relation Age of Onset  . OCD Mother   . Suicidality Neg Hx     Social History:  reports that he has been smoking Cigarettes.  He has been smoking about 0.00 packs per day. He has never used smokeless tobacco. He reports that he drinks alcohol. He reports that he does not  use illicit drugs.  Additional Social History:  Alcohol / Drug Use Pain Medications: denies Prescriptions: denies Over the Counter: denies History of alcohol / drug use?: Yes Longest period of sobriety (when/how long): 1 year Negative Consequences of Use: Personal relationships Withdrawal Symptoms:  (denies) Substance #1 Name of Substance 1: alcohol 1 - Age of First Use: teens 1 - Amount (size/oz): 1 gallon 1 - Frequency: daily 1 - Duration: past few days 1 - Last Use / Amount: last night  CIWA: CIWA-Ar BP: 165/84 mmHg Pulse Rate: 42 Nausea and Vomiting: no nausea and no vomiting Tactile Disturbances: none Tremor: no tremor Auditory Disturbances: not present Paroxysmal Sweats: two Visual Disturbances: not present Anxiety: two Headache, Fullness in Head: none present Agitation: moderately fidgety and restless Orientation and Clouding of Sensorium: disoriented for place or person CIWA-Ar Total: 12 COWS:    Allergies: No Known Allergies  Home Medications:  (Not in a hospital admission)  OB/GYN Status:  No LMP for male patient.  General Assessment Data Location of Assessment: [facility] Is this a Tele or Face-to-Face Assessment?: Face-to-Face Is this an Initial Assessment or a Re-assessment for this encounter?: Initial Assessment Living Arrangements: Alone Can pt return to current living arrangement?: Yes Admission Status: Voluntary Is patient capable of signing voluntary admission?: Yes  Transfer from: Home Referral Source: Self/Family/Friend     [facility] Living Arrangements: Alone  Education Status Is patient currently in school?: No  Risk to self with the past 6 months Suicidal Ideation: No Suicidal Intent: No Is patient at risk for suicide?: No Suicidal Plan?: No Access to Means: No What has been your use of drugs/alcohol within the last 12 months?: see SA section Previous Attempts/Gestures: No Intentional Self Injurious Behavior: None Family  Suicide History: Unable to assess Recent stressful life event(s):  (none known) Persecutory voices/beliefs?: No Depression: Yes Depression Symptoms: Insomnia;Fatigue;Guilt;Loss of interest in usual pleasures;Feeling worthless/self pity;Feeling angry/irritable Substance abuse history and/or treatment for substance abuse?: Yes Suicide prevention information given to non-admitted patients: Not applicable  Risk to Others within the past 6 months Homicidal Ideation: No Thoughts of Harm to Others: No Current Homicidal Intent: No Current Homicidal Plan: No Access to Homicidal Means: No History of harm to others?: No Assessment of Violence: None Noted Violent Behavior Description:  (none) Does patient have access to weapons?: No Criminal Charges Pending?: No Does patient have a court date: No  Psychosis Hallucinations: None noted Delusions: None noted  Mental Status Report Appear/Hygiene: Disheveled;Body odor;In hospital gown Eye Contact: Poor Motor Activity: Restlessness Speech: Logical/coherent;Slow Level of Consciousness: Drowsy;Sedated Mood: Depressed Affect: Flat;Depressed Anxiety Level: Minimal Thought Processes: Coherent;Relevant Judgement: Impaired Orientation: Person;Place;Situation Obsessive Compulsive Thoughts/Behaviors: None  Cognitive Functioning Concentration: Decreased Memory: Recent Intact;Remote Intact IQ: Average Insight: Poor Impulse Control: Poor Appetite: Fair Weight Loss: 0 Weight Gain: 0 Sleep: Decreased Total Hours of Sleep: 4 Vegetative Symptoms: Decreased grooming  ADLScreening [facility] Services) Patient's cognitive ability adequate to safely complete daily activities?: Yes Patient able to express need for assistance with ADLs?: Yes Independently performs ADLs?: Yes (appropriate for developmental age)  Prior Inpatient Therapy Prior Inpatient Therapy: Yes Prior Therapy Dates:  (unknown) Prior Therapy Facilty/Provider(s):   ([facility],others) Reason for Treatment:  (addiction)  Prior Outpatient Therapy Prior Outpatient Therapy: Yes Prior Therapy Dates: currently Prior Therapy Facilty/Provider(s):  ([facility]) Reason for Treatment:  (depression, SA)  ADL Screening (condition at time of admission) Patient's cognitive ability adequate to safely complete daily activities?: Yes Is the patient deaf or have difficulty hearing?: No Does the patient have difficulty seeing, even when wearing glasses/contacts?: No Does the patient have difficulty concentrating, remembering, or making decisions?: No Patient able to express need for assistance with ADLs?: Yes Does the patient have difficulty dressing or bathing?: No Independently performs ADLs?: Yes (appropriate for developmental age) Does the patient have difficulty walking or climbing stairs?: No  Home Assistive Devices/Equipment Home Assistive Devices/Equipment: None    Abuse/Neglect Assessment (Assessment to be complete while patient is alone) Physical Abuse: Denies Verbal Abuse: Denies Sexual Abuse: Denies Exploitation of patient/patient's resources: Denies Self-Neglect: Denies Values / Beliefs Cultural Requests During Hospitalization: None Spiritual Requests During Hospitalization: None   Advance Directives (For Healthcare) Does patient have an advance directive?: No Would patient like information on creating an advanced directive?: No - patient declined information    Additional Information 1:1 In Past 12 Months?: No CIRT Risk: No Elopement Risk: No Does patient have medical clearance?: Yes     Disposition:  Disposition Initial Assessment Completed for this Encounter: Yes Disposition of Patient: Other dispositions (pending pt's decision for withdrawal)  On Site Evaluation by:   Reviewed with Physician:    Theo DillsHull,Malorie Bigford Hines 08/22/2013 12:52 PM

## 2013-08-22 NOTE — Progress Notes (Signed)
  CARE MANAGEMENT ED NOTE 08/22/2013  Patient:  Ian [name],Ian [name]   Account Number:  [id number]  Date Initiated:  08/22/2013  Documentation initiated by:  Edd ArbourGIBBS,Shanyce Daris  Subjective/Objective Assessment:   33 yr old male self pay [place] pt c/o alcohol intoxication. Pt is very obviously intoxicated and when asked his name, he continues to repeat his birthday over and over. Family said that he has been binge drinking since Monday night     Subjective/Objective Assessment Detail:   pt with 2 ED visits & no admission in last 6 months  Pt states he does not see Tammy parrot byt courtney wharton at [facility] at [employer]     Action/Plan:   EPIC updated   Action/Plan Detail:   Anticipated DC Date:  08/22/2013     Status Recommendation to Physician:   Result of Recommendation:    Other ED Services  Consult Working Plan    DC Planning Services  Outpatient Services - Pt will follow up  Other  PCP issues    Choice offered to / List presented to:            Status of service:  Completed, signed off  ED Comments:   ED Comments Detail:

## 2013-08-23 ENCOUNTER — Emergency Department: Payer: Self-pay | Admitting: Emergency Medicine

## 2013-08-23 LAB — DRUG SCREEN, URINE
Amphetamines, Ur Screen: NEGATIVE (ref ?–1000)
Barbiturates, Ur Screen: NEGATIVE (ref ?–200)
Benzodiazepine, Ur Scrn: NEGATIVE (ref ?–200)
COCAINE METABOLITE, UR ~~LOC~~: NEGATIVE (ref ?–300)
Cannabinoid 50 Ng, Ur ~~LOC~~: NEGATIVE (ref ?–50)
MDMA (ECSTASY) UR SCREEN: NEGATIVE (ref ?–500)
METHADONE, UR SCREEN: NEGATIVE (ref ?–300)
Opiate, Ur Screen: NEGATIVE (ref ?–300)
Phencyclidine (PCP) Ur S: NEGATIVE (ref ?–25)
Tricyclic, Ur Screen: NEGATIVE (ref ?–1000)

## 2013-08-23 LAB — COMPREHENSIVE METABOLIC PANEL
ALK PHOS: 63 U/L
ALT: 50 U/L
AST: 37 U/L (ref 15–37)
Albumin: 3.8 g/dL (ref 3.4–5.0)
Anion Gap: 10 (ref 7–16)
BILIRUBIN TOTAL: 0.8 mg/dL (ref 0.2–1.0)
BUN: 9 mg/dL (ref 7–18)
CO2: 27 mmol/L (ref 21–32)
Calcium, Total: 8.4 mg/dL — ABNORMAL LOW (ref 8.5–10.1)
Chloride: 107 mmol/L (ref 98–107)
Creatinine: 0.97 mg/dL (ref 0.60–1.30)
EGFR (Non-African Amer.): >60
GLUCOSE: 102 mg/dL — AB (ref 65–99)
Osmolality: 286 (ref 275–301)
POTASSIUM: 3.4 mmol/L — AB (ref 3.5–5.1)
Sodium: 144 mmol/L (ref 136–145)
Total Protein: 7.4 g/dL (ref 6.4–8.2)

## 2013-08-23 LAB — URINALYSIS, COMPLETE
BACTERIA: NONE SEEN
BILIRUBIN, UR: NEGATIVE
BLOOD: NEGATIVE
Glucose,UR: NEGATIVE mg/dL (ref 0–75)
KETONE: NEGATIVE
LEUKOCYTE ESTERASE: NEGATIVE
Nitrite: NEGATIVE
PH: 8 (ref 4.5–8.0)
Protein: NEGATIVE
Specific Gravity: 1.003 (ref 1.003–1.030)
Squamous Epithelial: <1
WBC UR: NONE SEEN /HPF (ref 0–5)

## 2013-08-23 LAB — CBC
HCT: 40.6 % (ref 40.0–52.0)
HGB: 13.7 g/dL (ref 13.0–18.0)
MCH: 31.4 pg (ref 26.0–34.0)
MCHC: 33.7 g/dL (ref 32.0–36.0)
MCV: 93 fL (ref 80–100)
PLATELETS: 246 10*3/uL (ref 150–440)
RBC: 4.35 10*6/uL — ABNORMAL LOW (ref 4.40–5.90)
RDW: 12.9 % (ref 11.5–14.5)
WBC: 8.8 10*3/uL (ref 3.8–10.6)

## 2013-08-23 LAB — ETHANOL
Ethanol %: <0.003 % (ref 0.000–0.080)
Ethanol: <3 mg/dL

## 2013-09-02 ENCOUNTER — Telehealth (HOSPITAL_COMMUNITY): Payer: Self-pay | Admitting: *Deleted

## 2013-09-02 DIAGNOSIS — F331 Major depressive disorder, recurrent, moderate: Secondary | ICD-10-CM

## 2013-09-02 NOTE — Telephone Encounter (Signed)
Patient left VM:Has been out of town.Has either lost, misplaced or left his Vistaril someplace.does not have any.[facility] has a refill on file, but will only fill early (last filled 8/6) with MD authorization.

## 2013-09-02 NOTE — Telephone Encounter (Signed)
We can authorize a partial refill at this time.

## 2013-09-04 MED ORDER — HYDROXYZINE HCL 50 MG PO TABS: 50.0000 mg | ORAL_TABLET | Freq: Three times a day (TID) | ORAL | Status: DC | PRN

## 2013-09-04 NOTE — Addendum Note (Signed)
Addended by: Tonny Bollman on: 09/04/2013 01:51 PM   Modules accepted: Orders

## 2013-09-04 NOTE — Telephone Encounter (Signed)
Phoned patient, left message: New RX for Vistaril authorized by MD.Will be called to [facility]

## 2013-10-14 ENCOUNTER — Encounter (HOSPITAL_COMMUNITY): Payer: Self-pay | Admitting: Psychiatry

## 2013-10-14 ENCOUNTER — Ambulatory Visit (INDEPENDENT_AMBULATORY_CARE_PROVIDER_SITE_OTHER): Payer: BC Managed Care – PPO | Admitting: Psychiatry

## 2013-10-14 VITALS — BP 113/91 | HR 98 | Ht 72.0 in | Wt 230.2 lb

## 2013-10-14 DIAGNOSIS — F1020 Alcohol dependence, uncomplicated: Secondary | ICD-10-CM

## 2013-10-14 DIAGNOSIS — Z720 Tobacco use: Secondary | ICD-10-CM

## 2013-10-14 DIAGNOSIS — F331 Major depressive disorder, recurrent, moderate: Secondary | ICD-10-CM

## 2013-10-14 DIAGNOSIS — F411 Generalized anxiety disorder: Secondary | ICD-10-CM

## 2013-10-14 MED ORDER — TRAZODONE HCL 100 MG PO TABS: 100.0000 mg | ORAL_TABLET | Freq: Every evening | ORAL | Status: DC | PRN

## 2013-10-14 MED ORDER — HYDROXYZINE HCL 50 MG PO TABS: 50.0000 mg | ORAL_TABLET | Freq: Three times a day (TID) | ORAL | Status: DC | PRN

## 2013-10-14 MED ORDER — DIVALPROEX SODIUM 500 MG PO DR TAB: 500.0000 mg | DELAYED_RELEASE_TABLET | Freq: Two times a day (BID) | ORAL | Status: DC

## 2013-10-14 MED ORDER — CITALOPRAM HYDROBROMIDE 40 MG PO TABS: 40.0000 mg | ORAL_TABLET | Freq: Every day | ORAL | Status: DC
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
## 2013-10-15 ENCOUNTER — Encounter (HOSPITAL_COMMUNITY): Payer: Self-pay | Admitting: Psychiatry

## 2013-12-16 ENCOUNTER — Ambulatory Visit (HOSPITAL_COMMUNITY): Payer: Self-pay | Admitting: Psychiatry

## 2014-01-30 ENCOUNTER — Encounter (HOSPITAL_COMMUNITY): Payer: Self-pay | Admitting: Emergency Medicine

## 2014-01-30 ENCOUNTER — Inpatient Hospital Stay (HOSPITAL_COMMUNITY)
Admission: EM | Admit: 2014-01-30 | Discharge: 2014-02-01 | DRG: 439 | Disposition: A | Discharge status: Home / Self Care | Payer: Self-pay | Attending: Internal Medicine | Admitting: Internal Medicine

## 2014-01-30 ENCOUNTER — Other Ambulatory Visit: Payer: Self-pay

## 2014-01-30 ENCOUNTER — Emergency Department (HOSPITAL_COMMUNITY): Payer: Self-pay

## 2014-01-30 DIAGNOSIS — F419 Anxiety disorder, unspecified: Secondary | ICD-10-CM | POA: Diagnosis present

## 2014-01-30 DIAGNOSIS — F329 Major depressive disorder, single episode, unspecified: Secondary | ICD-10-CM | POA: Diagnosis present

## 2014-01-30 DIAGNOSIS — E871 Hypo-osmolality and hyponatremia: Secondary | ICD-10-CM | POA: Diagnosis present

## 2014-01-30 DIAGNOSIS — K858 Other acute pancreatitis without necrosis or infection: Secondary | ICD-10-CM

## 2014-01-30 DIAGNOSIS — J45909 Unspecified asthma, uncomplicated: Secondary | ICD-10-CM | POA: Diagnosis present

## 2014-01-30 DIAGNOSIS — D696 Thrombocytopenia, unspecified: Secondary | ICD-10-CM | POA: Diagnosis present

## 2014-01-30 DIAGNOSIS — R1084 Generalized abdominal pain: Secondary | ICD-10-CM

## 2014-01-30 DIAGNOSIS — F1010 Alcohol abuse, uncomplicated: Secondary | ICD-10-CM

## 2014-01-30 DIAGNOSIS — R109 Unspecified abdominal pain: Secondary | ICD-10-CM

## 2014-01-30 DIAGNOSIS — E860 Dehydration: Secondary | ICD-10-CM | POA: Diagnosis present

## 2014-01-30 DIAGNOSIS — K859 Acute pancreatitis without necrosis or infection, unspecified: Secondary | ICD-10-CM | POA: Diagnosis present

## 2014-01-30 DIAGNOSIS — I10 Essential (primary) hypertension: Secondary | ICD-10-CM | POA: Diagnosis present

## 2014-01-30 DIAGNOSIS — K852 Alcohol induced acute pancreatitis: Principal | ICD-10-CM | POA: Diagnosis present

## 2014-01-30 DIAGNOSIS — Z87891 Personal history of nicotine dependence: Secondary | ICD-10-CM

## 2014-01-30 DIAGNOSIS — R112 Nausea with vomiting, unspecified: Secondary | ICD-10-CM

## 2014-01-30 DIAGNOSIS — D72829 Elevated white blood cell count, unspecified: Secondary | ICD-10-CM | POA: Insufficient documentation

## 2014-01-30 DIAGNOSIS — F10239 Alcohol dependence with withdrawal, unspecified: Secondary | ICD-10-CM | POA: Diagnosis present

## 2014-01-30 LAB — CBC
HCT: 44.8 % (ref 39.0–52.0)
Hemoglobin: 15.9 g/dL (ref 13.0–17.0)
MCH: 32.1 pg (ref 26.0–34.0)
MCHC: 35.5 g/dL (ref 30.0–36.0)
MCV: 90.3 fL (ref 78.0–100.0)
PLATELETS: 171 10*3/uL (ref 150–400)
RBC: 4.96 MIL/uL (ref 4.22–5.81)
RDW: 13 % (ref 11.5–15.5)
WBC: 19.8 10*3/uL — AB (ref 4.0–10.5)

## 2014-01-30 LAB — URINALYSIS, ROUTINE W REFLEX MICROSCOPIC
BILIRUBIN URINE: NEGATIVE
Glucose, UA: NEGATIVE mg/dL
KETONES UR: 40 mg/dL — AB
Nitrite: POSITIVE — AB
Protein, ur: 30 mg/dL — AB
Specific Gravity, Urine: 1.037 — ABNORMAL HIGH (ref 1.005–1.030)
UROBILINOGEN UA: 0.2 mg/dL (ref 0.0–1.0)
pH: 5.5 (ref 5.0–8.0)

## 2014-01-30 LAB — RAPID URINE DRUG SCREEN, HOSP PERFORMED
Amphetamines: NOT DETECTED
BENZODIAZEPINES: POSITIVE — AB
Barbiturates: NOT DETECTED
Cocaine: NOT DETECTED
Opiates: POSITIVE — AB
TETRAHYDROCANNABINOL: NOT DETECTED

## 2014-01-30 LAB — ETHANOL: Alcohol, Ethyl (B): <5 mg/dL (ref 0–9)

## 2014-01-30 LAB — COMPREHENSIVE METABOLIC PANEL
ALBUMIN: 4.4 g/dL (ref 3.5–5.2)
ALT: 44 U/L (ref 0–53)
AST: 49 U/L — ABNORMAL HIGH (ref 0–37)
Alkaline Phosphatase: 92 U/L (ref 39–117)
Anion gap: 15 (ref 5–15)
BUN: 16 mg/dL (ref 6–23)
CALCIUM: 8.6 mg/dL (ref 8.4–10.5)
CO2: 20 mmol/L (ref 19–32)
Chloride: 94 mmol/L — ABNORMAL LOW (ref 96–112)
Creatinine, Ser: 1.01 mg/dL (ref 0.50–1.35)
GFR calc Af Amer: >90 mL/min (ref >90)
GLUCOSE: 175 mg/dL — AB (ref 70–99)
POTASSIUM: 4 mmol/L (ref 3.5–5.1)
Sodium: 129 mmol/L — ABNORMAL LOW (ref 135–145)
Total Bilirubin: 1.6 mg/dL — ABNORMAL HIGH (ref 0.3–1.2)
Total Protein: 7.8 g/dL (ref 6.0–8.3)

## 2014-01-30 LAB — URINE MICROSCOPIC-ADD ON

## 2014-01-30 LAB — LIPASE, BLOOD: LIPASE: 616 U/L — AB (ref 11–59)

## 2014-01-30 MED ORDER — CITALOPRAM HYDROBROMIDE 40 MG PO TABS
40.0000 mg | ORAL_TABLET | Freq: Every day | ORAL | Status: DC
  Administered 2014-01-31: 40 mg via ORAL
  Filled 2014-01-30 (×2): qty 1

## 2014-01-30 MED ORDER — FOLIC ACID 1 MG PO TABS
1.0000 mg | ORAL_TABLET | Freq: Every day | ORAL | Status: DC
  Administered 2014-01-31: 1 mg via ORAL
  Filled 2014-01-30 (×2): qty 1

## 2014-01-30 MED ORDER — IOHEXOL 300 MG/ML  SOLN
100.0000 mL | Freq: Once | INTRAMUSCULAR | Status: AC | PRN
  Administered 2014-01-30: 100 mL via INTRAVENOUS

## 2014-01-30 MED ORDER — ADULT MULTIVITAMIN W/MINERALS CH
1.0000 | ORAL_TABLET | Freq: Every day | ORAL | Status: DC
  Administered 2014-01-30 – 2014-01-31 (×2): 1 via ORAL
  Filled 2014-01-30 (×3): qty 1

## 2014-01-30 MED ORDER — MORPHINE SULFATE 4 MG/ML IJ SOLN
4.0000 mg | Freq: Once | INTRAMUSCULAR | Status: AC
  Administered 2014-01-30: 4 mg via INTRAVENOUS
  Filled 2014-01-30: qty 1

## 2014-01-30 MED ORDER — SODIUM CHLORIDE 0.9 % IV BOLUS (SEPSIS)
1000.0000 mL | Freq: Once | INTRAVENOUS | Status: AC
  Administered 2014-01-30: 1000 mL via INTRAVENOUS

## 2014-01-30 MED ORDER — HYDROXYZINE HCL 50 MG PO TABS
50.0000 mg | ORAL_TABLET | Freq: Three times a day (TID) | ORAL | Status: DC | PRN
  Filled 2014-01-30 (×2): qty 1

## 2014-01-30 MED ORDER — TRAZODONE HCL 50 MG PO TABS
100.0000 mg | ORAL_TABLET | Freq: Every evening | ORAL | Status: DC | PRN
  Administered 2014-01-30 – 2014-01-31 (×2): 100 mg via ORAL
  Filled 2014-01-30 (×2): qty 2

## 2014-01-30 MED ORDER — ONDANSETRON HCL 4 MG/2ML IJ SOLN
4.0000 mg | Freq: Once | INTRAMUSCULAR | Status: AC
  Administered 2014-01-30: 4 mg via INTRAVENOUS
  Filled 2014-01-30: qty 2

## 2014-01-30 MED ORDER — LORAZEPAM 2 MG/ML IJ SOLN: 0.0000 mg | Freq: Two times a day (BID) | INTRAMUSCULAR | Status: DC

## 2014-01-30 MED ORDER — HYDROMORPHONE HCL 1 MG/ML IJ SOLN
0.5000 mg | INTRAMUSCULAR | Status: DC | PRN
  Administered 2014-01-30 – 2014-01-31 (×5): 1 mg via INTRAVENOUS
  Filled 2014-01-30 (×5): qty 1

## 2014-01-30 MED ORDER — HYDROMORPHONE HCL 1 MG/ML IJ SOLN
1.0000 mg | Freq: Once | INTRAMUSCULAR | Status: AC
  Administered 2014-01-30: 1 mg via INTRAVENOUS
  Filled 2014-01-30: qty 1

## 2014-01-30 MED ORDER — ONDANSETRON HCL 4 MG PO TABS: 4.0000 mg | ORAL_TABLET | Freq: Four times a day (QID) | ORAL | Status: DC | PRN

## 2014-01-30 MED ORDER — THIAMINE HCL 100 MG/ML IJ SOLN
100.0000 mg | Freq: Every day | INTRAMUSCULAR | Status: DC
  Filled 2014-01-30 (×2): qty 1

## 2014-01-30 MED ORDER — LORAZEPAM 2 MG/ML IJ SOLN
1.0000 mg | Freq: Four times a day (QID) | INTRAMUSCULAR | Status: DC | PRN
  Administered 2014-01-31: 1 mg via INTRAVENOUS
  Filled 2014-01-30 (×2): qty 1

## 2014-01-30 MED ORDER — ONDANSETRON HCL 4 MG/2ML IJ SOLN: 4.0000 mg | Freq: Four times a day (QID) | INTRAMUSCULAR | Status: DC | PRN

## 2014-01-30 MED ORDER — VITAMIN B-1 100 MG PO TABS
100.0000 mg | ORAL_TABLET | Freq: Every day | ORAL | Status: DC
  Administered 2014-01-30 – 2014-01-31 (×2): 100 mg via ORAL
  Filled 2014-01-30 (×3): qty 1

## 2014-01-30 MED ORDER — PANTOPRAZOLE SODIUM 40 MG IV SOLR
40.0000 mg | Freq: Two times a day (BID) | INTRAVENOUS | Status: DC
  Administered 2014-01-30 – 2014-01-31 (×3): 40 mg via INTRAVENOUS
  Filled 2014-01-30 (×6): qty 40

## 2014-01-30 MED ORDER — LORAZEPAM 2 MG/ML IJ SOLN
0.0000 mg | Freq: Four times a day (QID) | INTRAMUSCULAR | Status: DC
  Administered 2014-01-30: 2 mg via INTRAVENOUS
  Administered 2014-01-31 (×3): 1 mg via INTRAVENOUS
  Administered 2014-02-01: 2 mg via INTRAVENOUS
  Filled 2014-01-30 (×4): qty 1

## 2014-01-30 MED ORDER — LORAZEPAM 1 MG PO TABS
1.0000 mg | ORAL_TABLET | Freq: Four times a day (QID) | ORAL | Status: DC | PRN
  Administered 2014-02-01: 1 mg via ORAL
  Filled 2014-01-30: qty 1

## 2014-01-30 MED ORDER — OXYCODONE HCL 5 MG PO TABS: 5.0000 mg | ORAL_TABLET | ORAL | Status: DC | PRN

## 2014-01-30 MED ORDER — SODIUM CHLORIDE 0.9 % IV SOLN
INTRAVENOUS | Status: DC
  Administered 2014-01-30 – 2014-01-31 (×2): via INTRAVENOUS

## 2014-01-30 MED ORDER — ALUM & MAG HYDROXIDE-SIMETH 200-200-20 MG/5ML PO SUSP: 30.0000 mL | Freq: Four times a day (QID) | ORAL | Status: DC | PRN

## 2014-01-30 MED ORDER — ACETAMINOPHEN 650 MG RE SUPP: 650.0000 mg | Freq: Four times a day (QID) | RECTAL | Status: DC | PRN

## 2014-01-30 MED ORDER — ACETAMINOPHEN 325 MG PO TABS: 650.0000 mg | ORAL_TABLET | Freq: Four times a day (QID) | ORAL | Status: DC | PRN

## 2014-01-30 MED ORDER — ENOXAPARIN SODIUM 60 MG/0.6ML ~~LOC~~ SOLN
50.0000 mg | SUBCUTANEOUS | Status: DC
  Administered 2014-01-30: 50 mg via SUBCUTANEOUS
  Filled 2014-01-30 (×2): qty 0.6

## 2014-01-30 MED ORDER — SODIUM CHLORIDE 0.9 % IV SOLN
INTRAVENOUS | Status: AC
  Administered 2014-01-30: 150 mL/h via INTRAVENOUS

## 2014-01-30 MED ORDER — DIVALPROEX SODIUM 500 MG PO DR TAB
500.0000 mg | DELAYED_RELEASE_TABLET | Freq: Two times a day (BID) | ORAL | Status: DC
  Administered 2014-01-30 – 2014-01-31 (×3): 500 mg via ORAL
  Filled 2014-01-30 (×5): qty 1

## 2014-01-30 NOTE — ED Notes (Signed)
Patient transported to CT 

## 2014-01-30 NOTE — ED Notes (Signed)
Pt. Reminded of collecting urine for drug screen test, verbalized understanding.

## 2014-01-30 NOTE — Progress Notes (Signed)
Cm inquire dif Ian [name] was pcp  Pt stets [facility] is [name]

## 2014-01-30 NOTE — ED Notes (Signed)
Pt c/o abdominal pain since yesterday afternoon. Pt denies tenderness to palpation. Pt Denies N/V. Pt sts he had a small bowel movement this morning. A&Ox4. Ambulatory to room.

## 2014-01-30 NOTE — H&P (Signed)
[facility] Admission History and Physical       ANGELDEJESUS CALLAHAM ZOX:[id number] DOB: 1980/01/27 DOA: 01/30/2014  Referring physician: EDP PCP: Shirlean Mylar, D, MD  Specialists:   Chief Complaint: ABD Pain Nausea and Vomiting  HPI: BRONSON BRESSMAN is a 34 y.o. male with a history of ETOH abuse who presents to the ED with complaints of severe ABD Pain, nausea and vomiting x 2 days.    He denies fevers and chills.   He reports that he binge drinks and has not been able to drink for the past 2 days due to his symptoms.    Review of Systems:  Constitutional: No Weight Loss, No Weight Gain, Night Sweats, Fevers, Chills, Dizziness, Fatigue, or Generalized Weakness HEENT: No Headaches, Difficulty Swallowing,Tooth/Dental Problems,Sore Throat,  No Sneezing, Rhinitis, Ear Ache, Nasal Congestion, or Post Nasal Drip,  Cardio-vascular:  No Chest pain, Orthopnea, PND, Edema in Lower Extremities, Anasarca, Dizziness, Palpitations  Resp: No Dyspnea, No DOE, No Productive Cough, No Non-Productive Cough, No Hemoptysis, No Wheezing.    GI: No Heartburn, Indigestion, +Abdominal Pain, Nausea, Vomiting, Diarrhea, Hematemesis, Hematochezia, Melena, Change in Bowel Habits,  Loss of Appetite  GU: No Dysuria, Change in Color of Urine, No Urgency or Frequency, No Flank pain.  Musculoskeletal: No Joint Pain or Swelling, No Decreased Range of Motion, No Back Pain.  Neurologic: No Syncope, No Seizures, Muscle Weakness, Paresthesia, Vision Disturbance or Loss, No Diplopia, No Vertigo, No Difficulty Walking,  Skin: No Rash or Lesions. Psych: No Change in Mood or Affect, No Depression or Anxiety, No Memory loss, No Confusion, or Hallucinations   Past Medical History  Diagnosis Date  . Anxiety   . Depression   . Hypertension   . Asthma   . Alcohol dependence       Past Surgical History  Procedure Laterality Date  . No past surgeries         Prior to Admission medications   Medication Sig  Start Date End Date Taking? Authorizing Provider  albuterol (PROVENTIL HFA;VENTOLIN HFA) 108 (90 BASE) MCG/ACT inhaler Inhale 2 puffs into the lungs every 6 (six) hours as needed. For shortness of breath 08/22/13  Yes Beau Fanny, FNP  ALPRAZolam Prudy Feeler) 0.5 MG tablet Take 0.25 mg by mouth 3 (three) times daily as needed for anxiety.   Yes Historical Provider, MD  citalopram (CELEXA) 40 MG tablet Take 1 tablet (40 mg total) by mouth daily. 10/14/13  Yes Oletta Darter, MD  cyclobenzaprine (FLEXERIL) 10 MG tablet Take 1 tablet (10 mg total) by mouth at bedtime as needed for muscle spasms. 08/22/13  Yes Beau Fanny, FNP  divalproex (DEPAKOTE) 500 MG DR tablet Take 1 tablet (500 mg total) by mouth 2 (two) times daily. 10/14/13  Yes Oletta Darter, MD  hydrOXYzine (ATARAX/VISTARIL) 50 MG tablet Take 1 tablet (50 mg total) by mouth 3 (three) times daily as needed for anxiety. 10/14/13  Yes Oletta Darter, MD  traZODone (DESYREL) 100 MG tablet Take 1 tablet (100 mg total) by mouth at bedtime as needed. For sleep 10/14/13 01/30/14 Yes Oletta Darter, MD  mometasone-formoterol [facility]) 100-5 MCG/ACT AERO Inhale 2 puffs into the lungs 2 (two) times daily. Patient not taking: Reported on 01/30/2014 08/22/13   Beau Fanny, FNP  naltrexone (DEPADE) 50 MG tablet Take 0.5 tablets (25 mg total) by mouth daily. Patient not taking: Reported on 01/30/2014 08/22/13   Beau Fanny, FNP      No Known Allergies   Social  History:  reports that he quit smoking about 15 years ago. His smoking use included Cigarettes. He has never used smokeless tobacco. He reports that he drinks alcohol. He reports that he does not use illicit drugs.     Family History  Problem Relation Age of Onset  . OCD Mother   . Suicidality Neg Hx        Physical Exam:  GEN:  Pleasant Overweight   33 y.o. Caucasian male examined  and in no acute distress; cooperative with exam Filed Vitals:   01/30/14 1450 01/30/14 1455 01/30/14 1714    BP: 168/128 161/108 152/94  Pulse: 115  96  Temp: 98 F (36.7 C)    TempSrc: Oral    Resp: 18  16  SpO2: 96%  95%   Blood pressure 152/94, pulse 96, temperature 98 F (36.7 C), temperature source Oral, resp. rate 16, SpO2 95 %. PSYCH: He is alert and oriented x4; does not appear anxious does not appear depressed; affect is normal HEENT: Normocephalic and Atraumatic, Mucous membranes pink; PERRLA; EOM intact; Fundi:  Benign;  No scleral icterus, Nares: Patent, Oropharynx: Clear, Fair Dentition,    Neck:  FROM, No Cervical Lymphadenopathy nor Thyromegaly or Carotid Bruit; No JVD; Breasts:: Not examined CHEST WALL: No tenderness CHEST: Normal respiration, clear to auscultation bilaterally HEART: Regular rate and rhythm; no murmurs rubs or gallops BACK: No kyphosis or scoliosis; No CVA tenderness ABDOMEN: Positive Bowel Sounds,  Obese, Soft Non-Tender; No Masses, No Organomegaly,. Rectal Exam: Not done EXTREMITIES: No Cyanosis, Clubbing, or Edema; No Ulcerations. Genitalia: not examined PULSES: 2+ and symmetric SKIN: Normal hydration no rash or ulceration CNS:  Alert and Oriented x 4, No focal Deficits Vascular: pulses palpable throughout    Labs on Admission:  Basic Metabolic Panel:  Recent Labs Lab 01/30/14 1521  NA 129*  K 4.0  CL 94*  CO2 20  GLUCOSE 175*  BUN 16  CREATININE 1.01  CALCIUM 8.6   Liver Function Tests:  Recent Labs Lab 01/30/14 1521  AST 49*  ALT 44  ALKPHOS 92  BILITOT 1.6*  PROT 7.8  ALBUMIN 4.4    Recent Labs Lab 01/30/14 1521  LIPASE 616*   No results for input(s): AMMONIA in the last 168 hours. CBC:  Recent Labs Lab 01/30/14 1521  WBC 19.8*  HGB 15.9  HCT 44.8  MCV 90.3  PLT 171   Cardiac Enzymes: No results for input(s): CKTOTAL, CKMB, CKMBINDEX, TROPONINI in the last 168 hours.  BNP (last 3 results) No results for input(s): PROBNP in the last 8760 hours. CBG: No results for input(s): GLUCAP in the last 168  hours.  Radiological Exams on Admission: Ct Abdomen Pelvis W Contrast  01/30/2014   CLINICAL DATA:  34 year old male with mid abdominal pain, nausea and vomiting for 3 days. Medical history includes alcohol abuse.  EXAM: CT ABDOMEN AND PELVIS WITH CONTRAST  TECHNIQUE: Multidetector CT imaging of the abdomen and pelvis was performed using the standard protocol following bolus administration of intravenous contrast.  CONTRAST:  OMNIPAQUE IOHEXOL 300 MG/ML  SOLN  COMPARISON:  Prior abdominal ultrasound 08/06/2009  FINDINGS: Lower Chest: The lung bases are clear. Visualized cardiac structures are within normal limits for size. No pericardial effusion. Unremarkable visualized distal thoracic esophagus.  Abdomen: Unremarkable CT appearance of the stomach, spleen and adrenal glands. The pancreas appears edematous, particularly in the pancreatic head and uncinate process, and also at the junction of the pancreatic body and tail. There is extensive inflammatory  stranding throughout the peripancreatic fat as well as peripancreatic fluid which tracks up into the left subdiaphragmatic space and down the the bilateral pericolic gutters. Edema extends into the gastrosplenic ligament and root of the mesentery. No discrete fluid collection to suggest pseudocyst or abscess.  Normal hepatic contour and morphology. Gallbladder is unremarkable. No intra or extrahepatic biliary ductal dilatation. Unremarkable appearance of the bilateral kidneys. No focal solid lesion, hydronephrosis or nephrolithiasis.  Mild circumferential thickening of the descending duodenum is likely reactive. No evidence of bowel obstruction. Normal appendix in the right hemi abdomen. No evidence of free air.  Pelvis: Unremarkable bladder, prostate gland and seminal vesicles. No free fluid or suspicious adenopathy.  Bones/Soft Tissues: No acute fracture or aggressive appearing lytic or blastic osseous lesion.  Vascular: Normal patency of the splenic and  portal veins. No evidence of significant atherosclerotic plaque or aneurysm. Incidental note is made of a circumaortic left renal vein.  IMPRESSION: 1. CT findings are most consistent with acute uncomplicated pancreatitis. There is a moderate amount of peripancreatic stranding and fluid extending into the root of the mesentery, the porta hepatis, the left greater than right pericolic gutters and the gastrosplenic ligament. No evidence of pancreatic necrosis, pseudocyst or vascular complication.   Electronically Signed   By: Malachy MoanHeath  McCullough M.D.   On: 01/30/2014 17:13        Assessment/Plan:   34 y.o. male with  Active Problems:  1.   Pancreatitis/Acute Pancreatitis   Pain Control with IV Dilaudid PRN   IVFs   IV Protonix   Clear Liquids Diet     2.  ABD Pain- due to #1   Pain Control PRN     3.  Nausea and Vomiting   PRN IV Zofran     4.  Alcohol Abuse   CIWA  Protocol with IV Ativan     5.  Hyponatremia- Due to ETOH Abuse   IVFs with NSS     6.  DVT Prophylaxis   Lovenox    Code Status:    FULL CODE   Family Communication:    No Family Present Disposition Plan:    Inpatient /Med/Surg  Time spent:  7960 Minutes  Ron ParkerJENKINS,Bryar Dahms C [facility] Pager [phone]   If 7AM -7PM Please Contact the Day Rounding Team MD for [facility]  If 7PM-7AM, Please Contact Night-Floor Coverage  [email or web address] Password [email or web address] 01/30/2014, 8:04 PM

## 2014-01-30 NOTE — ED Provider Notes (Signed)
CSN: [id number]     Arrival date & time 01/30/14  1445 History   First MD Initiated Contact with Patient 01/30/14 1455     Chief Complaint  Patient presents with  . Abdominal Pain     (Consider location/radiation/quality/duration/timing/severity/associated sxs/prior Treatment) HPI  Pt presenting with c/o abdominal pain. Pain began yestereday and has been continuous.  Pain is worse in left upper and lower abdomen.  No nausea/vomiting.  No fever/chills.  He took a laxative and had a bowel movement this morning which did not relieve the pain.  No change in color of stool, no melena or blood in stool.  Pt does drink alcohol, states he has not had a drink for 2 days due to pain in abdomen.  No hx of prior similar symptoms.  There are no other associated systemic symptoms, there are no other alleviating or modifying factors.   Past Medical History  Diagnosis Date  . Anxiety   . Depression   . Hypertension   . Asthma   . Alcohol dependence    Past Surgical History  Procedure Laterality Date  . No past surgeries     Family History  Problem Relation Age of Onset  . OCD Mother   . Suicidality Neg Hx    History  Substance Use Topics  . Smoking status: Former Smoker    Types: Cigarettes    Quit date: 01/31/1999  . Smokeless tobacco: Never Used  . Alcohol Use: Yes     Comment: varies how much    Review of Systems  ROS reviewed and all otherwise negative except for mentioned in HPI    Allergies  Review of patient's allergies indicates no known allergies.  Home Medications   Prior to Admission medications   Medication Sig Start Date End Date Taking? Authorizing Provider  albuterol (PROVENTIL HFA;VENTOLIN HFA) 108 (90 BASE) MCG/ACT inhaler Inhale 2 puffs into the lungs every 6 (six) hours as needed. For shortness of breath 08/22/13  Yes Beau FannyJohn C Withrow, FNP  ALPRAZolam Prudy Feeler(XANAX) 0.5 MG tablet Take 0.25 mg by mouth 3 (three) times daily as needed for anxiety.   Yes Historical  Provider, MD  citalopram (CELEXA) 40 MG tablet Take 1 tablet (40 mg total) by mouth daily. 10/14/13  Yes Oletta DarterSalina Agarwal, MD  cyclobenzaprine (FLEXERIL) 10 MG tablet Take 1 tablet (10 mg total) by mouth at bedtime as needed for muscle spasms. 08/22/13  Yes Beau FannyJohn C Withrow, FNP  divalproex (DEPAKOTE) 500 MG DR tablet Take 1 tablet (500 mg total) by mouth 2 (two) times daily. 10/14/13  Yes Oletta DarterSalina Agarwal, MD  hydrOXYzine (ATARAX/VISTARIL) 50 MG tablet Take 1 tablet (50 mg total) by mouth 3 (three) times daily as needed for anxiety. 10/14/13  Yes Oletta DarterSalina Agarwal, MD  traZODone (DESYREL) 100 MG tablet Take 1 tablet (100 mg total) by mouth at bedtime as needed. For sleep 10/14/13 01/30/14 Yes Oletta DarterSalina Agarwal, MD  mometasone-formoterol [facility](DULERA) 100-5 MCG/ACT AERO Inhale 2 puffs into the lungs 2 (two) times daily. Patient not taking: Reported on 01/30/2014 08/22/13   Beau FannyJohn C Withrow, FNP  naltrexone (DEPADE) 50 MG tablet Take 0.5 tablets (25 mg total) by mouth daily. Patient not taking: Reported on 01/30/2014 08/22/13   Beau FannyJohn C Withrow, FNP   BP 159/107 mmHg  Pulse 113  Temp(Src) 98 F (36.7 C) (Oral)  Resp 20  Ht 6' (1.829 m)  Wt 233 lb (105.688 kg)  BMI 31.59 kg/m2  SpO2 96%  Vitals reviewed Physical Exam  Physical Examination: General  appearance - alert, well appearing, and in no distress Mental status - alert, oriented to person, place, and time Eyes - no conjunctival injection, no scleral icterus Mouth - mucous membranes moist, pharynx normal without lesions Chest - clear to auscultation, no wheezes, rales or rhonchi, symmetric air entry Heart - normal rate, regular rhythm, normal S1, S2, no murmurs, rubs, clicks or gallops Abdomen - soft, diffuse tenderness to palpation- mostly in epigastric region, left upper and lower abdomen, nabs, no gaurding or rebound tenderness, nondistended, no masses or organomegaly Extremities - peripheral pulses normal, no pedal edema, no clubbing or cyanosis Skin - normal  coloration and turgor, no rashes  ED Course  Procedures (including critical care time)  7:28 PM d/w Dr. Lovell Sheehan, [facility].  Pt to go to med/surg bed.   Labs Review Labs Reviewed  CBC - Abnormal; Notable for the following:    WBC 19.8 (*)    All other components within normal limits  COMPREHENSIVE METABOLIC PANEL - Abnormal; Notable for the following:    Sodium 129 (*)    Chloride 94 (*)    Glucose, Bld 175 (*)    AST 49 (*)    Total Bilirubin 1.6 (*)    All other components within normal limits  LIPASE, BLOOD - Abnormal; Notable for the following:    Lipase 616 (*)    All other components within normal limits  URINALYSIS, ROUTINE W REFLEX MICROSCOPIC - Abnormal; Notable for the following:    Color, Urine ORANGE (*)    Specific Gravity, Urine 1.037 (*)    Hgb urine dipstick SMALL (*)    Ketones, ur 40 (*)    Protein, ur 30 (*)    Nitrite POSITIVE (*)    Leukocytes, UA TRACE (*)    All other components within normal limits  URINE CULTURE  URINE MICROSCOPIC-ADD ON  ETHANOL  URINE RAPID DRUG SCREEN (HOSP PERFORMED)  BASIC METABOLIC PANEL  CBC    Imaging Review Ct Abdomen Pelvis W Contrast  01/30/2014   CLINICAL DATA:  34 year old male with mid abdominal pain, nausea and vomiting for 3 days. Medical history includes alcohol abuse.  EXAM: CT ABDOMEN AND PELVIS WITH CONTRAST  TECHNIQUE: Multidetector CT imaging of the abdomen and pelvis was performed using the standard protocol following bolus administration of intravenous contrast.  CONTRAST:  OMNIPAQUE IOHEXOL 300 MG/ML  SOLN  COMPARISON:  Prior abdominal ultrasound 08/06/2009  FINDINGS: Lower Chest: The lung bases are clear. Visualized cardiac structures are within normal limits for size. No pericardial effusion. Unremarkable visualized distal thoracic esophagus.  Abdomen: Unremarkable CT appearance of the stomach, spleen and adrenal glands. The pancreas appears edematous, particularly in the pancreatic head and  uncinate process, and also at the junction of the pancreatic body and tail. There is extensive inflammatory stranding throughout the peripancreatic fat as well as peripancreatic fluid which tracks up into the left subdiaphragmatic space and down the the bilateral pericolic gutters. Edema extends into the gastrosplenic ligament and root of the mesentery. No discrete fluid collection to suggest pseudocyst or abscess.  Normal hepatic contour and morphology. Gallbladder is unremarkable. No intra or extrahepatic biliary ductal dilatation. Unremarkable appearance of the bilateral kidneys. No focal solid lesion, hydronephrosis or nephrolithiasis.  Mild circumferential thickening of the descending duodenum is likely reactive. No evidence of bowel obstruction. Normal appendix in the right hemi abdomen. No evidence of free air.  Pelvis: Unremarkable bladder, prostate gland and seminal vesicles. No free fluid or suspicious adenopathy.  Bones/Soft  Tissues: No acute fracture or aggressive appearing lytic or blastic osseous lesion.  Vascular: Normal patency of the splenic and portal veins. No evidence of significant atherosclerotic plaque or aneurysm. Incidental note is made of a circumaortic left renal vein.  IMPRESSION: 1. CT findings are most consistent with acute uncomplicated pancreatitis. There is a moderate amount of peripancreatic stranding and fluid extending into the root of the mesentery, the porta hepatis, the left greater than right pericolic gutters and the gastrosplenic ligament. No evidence of pancreatic necrosis, pseudocyst or vascular complication.   Electronically Signed   By: Malachy Moan M.D.   On: 01/30/2014 17:13     EKG Interpretation None      MDM   Final diagnoses:  Abdominal pain  Other acute pancreatitis  Leukocytosis  Hyponatremia    Pt presenting with abdominal pain- pain has been constant since yesterday. Pt drinks alcohol but has not had a drink in 2 days due to abdominal  pain.  No vomiting.  Labs show leukocytosis, elevated lipase, hyponatremia.  CT scan c/w acute pancreatitis.  Pt treated with morphine, dilaudid x 2, NS bolus x 2.  Pt has had some relief of his pain, but remains with significant pain- pt to be admitted overnight, NPO/IV pain control.  His UA shows + nitrite but no other findings c/w UTI- he does not have dysuria- i have ordered urine culture.  Feel leukocytosis is related to pancreatitis- will hold on abx at this time.      Ethelda Chick, MD 01/30/14 2229

## 2014-01-31 LAB — BASIC METABOLIC PANEL
ANION GAP: 10 (ref 5–15)
BUN: 10 mg/dL (ref 6–23)
CHLORIDE: 94 mmol/L — AB (ref 96–112)
CO2: 26 mmol/L (ref 19–32)
Calcium: 7.8 mg/dL — ABNORMAL LOW (ref 8.4–10.5)
Creatinine, Ser: 0.83 mg/dL (ref 0.50–1.35)
GFR calc Af Amer: >90 mL/min (ref >90)
Glucose, Bld: 115 mg/dL — ABNORMAL HIGH (ref 70–99)
Potassium: 4.1 mmol/L (ref 3.5–5.1)
SODIUM: 130 mmol/L — AB (ref 135–145)

## 2014-01-31 LAB — CBC
HCT: 41 % (ref 39.0–52.0)
Hemoglobin: 14.1 g/dL (ref 13.0–17.0)
MCH: 31.8 pg (ref 26.0–34.0)
MCHC: 34.4 g/dL (ref 30.0–36.0)
MCV: 92.6 fL (ref 78.0–100.0)
Platelets: 121 10*3/uL — ABNORMAL LOW (ref 150–400)
RBC: 4.43 MIL/uL (ref 4.22–5.81)
RDW: 13.2 % (ref 11.5–15.5)
WBC: 22.4 10*3/uL — ABNORMAL HIGH (ref 4.0–10.5)

## 2014-01-31 LAB — LIPASE, BLOOD: Lipase: 508 U/L — ABNORMAL HIGH (ref 11–59)

## 2014-01-31 MED ORDER — OXYCODONE-ACETAMINOPHEN 5-325 MG PO TABS: 1.0000 | ORAL_TABLET | ORAL | Status: DC | PRN

## 2014-01-31 MED ORDER — CEFTRIAXONE SODIUM IN DEXTROSE 20 MG/ML IV SOLN
1.0000 g | INTRAVENOUS | Status: DC
  Administered 2014-01-31: 1 g via INTRAVENOUS
  Filled 2014-01-31 (×2): qty 50

## 2014-01-31 MED ORDER — HYDRALAZINE HCL 20 MG/ML IJ SOLN
10.0000 mg | Freq: Four times a day (QID) | INTRAMUSCULAR | Status: DC | PRN
  Administered 2014-01-31: 10 mg via INTRAVENOUS
  Filled 2014-01-31: qty 1

## 2014-01-31 MED ORDER — HYDRALAZINE HCL 20 MG/ML IJ SOLN: 10.0000 mg | Freq: Four times a day (QID) | INTRAMUSCULAR | Status: DC | PRN

## 2014-01-31 MED ORDER — HYDRALAZINE HCL 20 MG/ML IJ SOLN: 10.0000 mg | Freq: Four times a day (QID) | INTRAMUSCULAR | Status: DC

## 2014-01-31 MED ORDER — LABETALOL HCL 100 MG PO TABS
100.0000 mg | ORAL_TABLET | Freq: Two times a day (BID) | ORAL | Status: DC
  Administered 2014-01-31 (×2): 100 mg via ORAL
  Filled 2014-01-31 (×4): qty 1

## 2014-01-31 NOTE — Progress Notes (Addendum)
Patient ID: Ian [name], male   DOB: 08/02/1980, 34 y.o.   MRN: [id number] [facility] PROGRESS NOTE  Ian [name] NGE:[id number] DOB: 07/06/1980 DOA: 01/30/2014 PCP: Shirlean MylarWEBB, CAROL, D, MD  Brief narrative:    34 y.o. male with a history of ETOH abuse who [facility] with complaints of severe abdominal pain and associated nausea and vomiting for past few days piror to this admission. No fevers. No reports of hematemesis. No blood in stool.  On admission, pt hemodynamically stable. CT abdomen confirmed acute uncomplicated pancreatitis. Blood work with findings of leukocytosis of 19.8, sodium 129, normal renal function, lipase in 600 range.   Assessment/Plan:    Principal Problem:   Acute alcohol induced pancreatitis/ abdominal pain with nausea and vomiting - CT abdomen showed acute pancreatitis but no pseudocyst or pancreatic necrosis. Pancreatitis likely related to alcohol abuse. - pt on clear liquid diet this am - pt demands advanced diet despite his elevated lipase level - will see if he tolerate regular diet and if he does he asked to go home - slight improvement in lipase level since admission - may continue IV fluids - continue supportive care with antiemetics as needed and analgesia as needed  Active Problems: Leukocytosis - possibly from pancreatitis but he did have UA with trace leukocytes so will start rocephin IV daily  - follow up urine culture results    Hyponatremia - likely due to dehydration, alcohol abuse - continue IV fluids - sodium seems to be improving slightly with IV fluids   Hypertension / sinus tachycardia - this seems to be from withdrawing from alcohol - pt on CIWA protocol - will start labetalol 100 mg PO BID  Acute alcohol withdrawal - UDS pos for opiates and benzos, alcohol WNL - pt with intermittent agitation, sweating, hypertension and tachycardia; very concerning for withdrawals - continue CIWA protocol  - withdrawal  precaution   Thrombocytopenia - possibly from Lovenox; we stopped it and are using SCD's for DVT prophylaxis  DVT Prophylaxis  - stop lovenox due to mild thrombocytopenia; use SCD's instead   Code Status: Full.  Family Communication:  plan of care discussed with the patient Disposition Plan: Home when stable.   IV access:  Peripheral IV  Procedures and diagnostic studies:    Ct Abdomen Pelvis W Contrast 01/30/2014    1. CT findings are most consistent with acute uncomplicated pancreatitis. There is a moderate amount of peripancreatic stranding and fluid extending into the root of the mesentery, the porta hepatis, the left greater than right pericolic gutters and the gastrosplenic ligament. No evidence of pancreatic necrosis, pseudocyst or vascular complication.     Medical Consultants:  None  Other Consultants:  None  IAnti-Infectives:   None    Manson PasseyEVINE, Eljay Lave, MD  [facility] Pager [phone]  If 7PM-7AM, please contact night-coverage [email or web address] Password [email or web address] 01/31/2014, 12:50 PM   LOS: 1 day    HPI/Subjective: No acute overnight events.  Objective: Filed Vitals:   01/30/14 2107 01/31/14 0015 01/31/14 0234 01/31/14 0541  BP:  145/104 145/106 148/95  Pulse: 113 118  139  Temp: 98 F (36.7 C)     TempSrc: Oral     Resp: 20     Height:      Weight:      SpO2: 96% 95% 96% 94%   No intake or output data in the 24 hours ending 01/31/14 1250  Exam:   General:  Pt is not in distress  Cardiovascular: tachycardic,  S1/S2 (+)  Respiratory: no wheezing, no crackles, no rhonchi  Abdomen: Soft, non tender, non distended, bowel sounds present  Extremities: No edema, pulses DP and PT palpable bilaterally  Neuro: Grossly nonfocal  Data Reviewed: Basic Metabolic Panel:  Recent Labs Lab 01/30/14 1521 01/31/14 0506  NA 129* 130*  K 4.0 4.1  CL 94* 94*  CO2 20 26  GLUCOSE 175* 115*  BUN 16 10  CREATININE 1.01 0.83  CALCIUM 8.6 7.8*   Liver  Function Tests:  Recent Labs Lab 01/30/14 1521  AST 49*  ALT 44  ALKPHOS 92  BILITOT 1.6*  PROT 7.8  ALBUMIN 4.4    Recent Labs Lab 01/30/14 1521 01/31/14 0832  LIPASE 616* 508*   No results for input(s): AMMONIA in the last 168 hours. CBC:  Recent Labs Lab 01/30/14 1521 01/31/14 0506  WBC 19.8* 22.4*  HGB 15.9 14.1  HCT 44.8 41.0  MCV 90.3 92.6  PLT 171 121*   Cardiac Enzymes: No results for input(s): CKTOTAL, CKMB, CKMBINDEX, TROPONINI in the last 168 hours. BNP: Invalid input(s): POCBNP CBG: No results for input(s): GLUCAP in the last 168 hours.  No results found for this or any previous visit (from the past 240 hour(s)).   Scheduled Meds: . citalopram  40 mg Oral Daily  . divalproex  500 mg Oral BID  . enoxaparin (LOVENOX) injection  50 mg Subcutaneous Q24H  . folic acid  1 mg Oral Daily  . LORazepam  0-4 mg Intravenous Q6H   Followed by  . [START ON 02/01/2014] LORazepam  0-4 mg Intravenous Q12H  . multivitamin with minerals  1 tablet Oral Daily  . pantoprazole (PROTONIX) IV  40 mg Intravenous Q12H  . thiamine  100 mg Oral Daily   Or  . thiamine  100 mg Intravenous Daily   Continuous Infusions: . sodium chloride 100 mL/hr at 01/30/14 2249

## 2014-02-01 LAB — URINE CULTURE
CULTURE: NO GROWTH
Colony Count: NO GROWTH

## 2014-02-01 MED ORDER — LABETALOL HCL 100 MG PO TABS: 100.0000 mg | ORAL_TABLET | Freq: Two times a day (BID) | ORAL | Status: DC

## 2014-02-01 MED ORDER — THIAMINE HCL 100 MG PO TABS: 100.0000 mg | ORAL_TABLET | Freq: Every day | ORAL | Status: DC

## 2014-02-01 MED ORDER — NITROFURANTOIN MONOHYD MACRO 100 MG PO CAPS: 100.0000 mg | ORAL_CAPSULE | Freq: Two times a day (BID) | ORAL | Status: DC

## 2014-02-01 MED ORDER — FOLIC ACID 1 MG PO TABS: 1.0000 mg | ORAL_TABLET | Freq: Every day | ORAL | Status: DC

## 2014-02-01 MED ORDER — ADULT MULTIVITAMIN W/MINERALS CH: 1.0000 | ORAL_TABLET | Freq: Every day | ORAL | Status: DC

## 2014-02-01 NOTE — Discharge Summary (Signed)
Physician Discharge Summary  Ian [name] ZOX:[id number] DOB: Apr 24, 1980 DOA: 01/30/2014  PCP: Shirlean Mylar, D, MD  Admit date: 01/30/2014 Discharge date: 02/01/2014  Recommendations for Outpatient Follow-up:  1. Take labetalol 100 mg twice ad ay for blood pressure control 2. Take Macrobid for 7 days for urinary tract infection   Discharge Diagnoses:  Principal Problem:   Acute pancreatitis Active Problems:   Pancreatitis   Abdominal pain   Nausea and vomiting   Hyponatremia   Alcohol abuse   Leukocytosis    Discharge Condition: stable; pt insisted on going home this am  Diet recommendation: as tolerated   History of present illness:  34 y.o. male with a history of ETOH abuse who presented to [facility] ED with complaints of severe abdominal pain and associated nausea and vomiting for past few days piror to this admission. No fevers. No reports of hematemesis. No blood in stool.  On admission, pt hemodynamically stable. CT abdomen confirmed acute uncomplicated pancreatitis. Blood work with findings of leukocytosis of 19.8, sodium 129, normal renal function, lipase in 600 range.   Assessment/Plan:    Principal Problem:  Acute alcohol induced pancreatitis/ abdominal pain with nausea and vomiting - on admission CT abdomen showed acute pancreatitis but no pseudocyst or pancreatic necrosis. Pancreatitis likely related to alcohol abuse. - pt tolerated regular diet - only slight improvement in lipase level seen since admission but abdominal pain better   Active Problems: Leukocytosis - Patient had trace leukocytes on urinalysis was started Rocephin. On discharge he will continue Macrobid for 7 days.  Hyponatremia - likely due to dehydration, alcohol abuse - Sodium improved slightly with IV fluids, it is 130 prior to discharge  Hypertension / sinus tachycardia - this seems to be from withdrawing from alcohol - Patient was started on CIWA protocol. - His hypertension was  controlled with labetalol 100 mg twice daily and this was provided as prescription on discharge  Acute alcohol withdrawal - UDS pos for opiates and benzos, alcohol WNL - pt had intermittent agitation, sweating, hypertension and tachycardia; very concerning for withdrawals - was on CIWA protocol - Counseled on alcohol cessation  Thrombocytopenia - possibly from Lovenox which was stopped after the admission.  DVT Prophylaxis  - stop lovenox due to mild thrombocytopenia; use SCD's instead   Code Status: Full.    IV access:  Peripheral IV  Procedures and diagnostic studies:   Ct Abdomen Pelvis W Contrast 01/30/2014 1. CT findings are most consistent with acute uncomplicated pancreatitis. There is a moderate amount of peripancreatic stranding and fluid extending into the root of the mesentery, the porta hepatis, the left greater than right pericolic gutters and the gastrosplenic ligament. No evidence of pancreatic necrosis, pseudocyst or vascular complication.   Medical Consultants:  None  Other Consultants:  None  IAnti-Infectives:   None     Signed:  Manson Passey, MD  [facility] 02/01/2014, 9:31 AM  Pager #: [phone]    Discharge Exam: Filed Vitals:   02/01/14 0505  BP: 133/93  Pulse: 116  Temp: 98.2 F (36.8 C)  Resp: 18   Filed Vitals:   01/31/14 1500 01/31/14 2000 02/01/14 0041 02/01/14 0505  BP:  135/78 145/100 133/93  Pulse:  114 109 116  Temp:  98.7 F (37.1 C) 98.4 F (36.9 C) 98.2 F (36.8 C)  TempSrc:  Oral Oral Oral  Resp:  19 19 18   Height: 6' (1.829 m)     Weight:      SpO2:  96%  95% 94%    General: Pt is alert, not in acute distress Cardiovascular: Regular rate and rhythm, S1/S2  Respiratory: Clear to auscultation bilaterally, no wheezing Abdominal: non distended, bowel sounds +   Discharge Instructions  Discharge Instructions    Call MD for:  difficulty breathing, headache or visual disturbances     Complete by:  As directed      Call MD for:  persistant dizziness or light-headedness    Complete by:  As directed      Call MD for:  persistant nausea and vomiting    Complete by:  As directed      Call MD for:  severe uncontrolled pain    Complete by:  As directed      Diet - low sodium heart healthy    Complete by:  As directed      Discharge instructions    Complete by:  As directed   1. Take labetalol 100 mg twice ad ay for blood pressure control 2. Take Macrobid for 7 days for urinary tract infection     Increase activity slowly    Complete by:  As directed             Medication List    STOP taking these medications        mometasone-formoterol 100-5 MCG/ACT Aero  Commonly known as:  DULERA     naltrexone 50 MG tablet  Commonly known as:  DEPADE      TAKE these medications        albuterol 108 (90 BASE) MCG/ACT inhaler  Commonly known as:  PROVENTIL HFA;VENTOLIN HFA  Inhale 2 puffs into the lungs every 6 (six) hours as needed. For shortness of breath     ALPRAZolam 0.5 MG tablet  Commonly known as:  XANAX  Take 0.25 mg by mouth 3 (three) times daily as needed for anxiety.     citalopram 40 MG tablet  Commonly known as:  CELEXA  Take 1 tablet (40 mg total) by mouth daily.     cyclobenzaprine 10 MG tablet  Commonly known as:  FLEXERIL  Take 1 tablet (10 mg total) by mouth at bedtime as needed for muscle spasms.     divalproex 500 MG DR tablet  Commonly known as:  DEPAKOTE  Take 1 tablet (500 mg total) by mouth 2 (two) times daily.     folic acid 1 MG tablet  Commonly known as:  FOLVITE  Take 1 tablet (1 mg total) by mouth daily.     hydrOXYzine 50 MG tablet  Commonly known as:  ATARAX/VISTARIL  Take 1 tablet (50 mg total) by mouth 3 (three) times daily as needed for anxiety.     labetalol 100 MG tablet  Commonly known as:  NORMODYNE  Take 1 tablet (100 mg total) by mouth 2 (two) times daily.     multivitamin with minerals Tabs tablet  Take 1  tablet by mouth daily.     nitrofurantoin (macrocrystal-monohydrate) 100 MG capsule  Commonly known as:  MACROBID  Take 1 capsule (100 mg total) by mouth 2 (two) times daily.     oxyCODONE-acetaminophen 5-325 MG per tablet  Commonly known as:  PERCOCET  Take 1 tablet by mouth every 4 (four) hours as needed for severe pain.     thiamine 100 MG tablet  Take 1 tablet (100 mg total) by mouth daily.     traZODone 100 MG tablet  Commonly known as:  DESYREL  Take 1 tablet (100  mg total) by mouth at bedtime as needed. For sleep           Follow-up Information    Follow up with WEBB, CAROL, D, MD. Schedule an appointment as soon as possible for a visit in 1 week.   Specialty:  Family Medicine   Why:  Follow up appt after recent hospitalization   Contact information:   [address] [place] [address] [phone]        The results of significant diagnostics from this hospitalization (including imaging, microbiology, ancillary and laboratory) are listed below for reference.    Significant Diagnostic Studies: Ct Abdomen Pelvis W Contrast  01/30/2014   CLINICAL DATA:  34 year old male with mid abdominal pain, nausea and vomiting for 3 days. Medical history includes alcohol abuse.  EXAM: CT ABDOMEN AND PELVIS WITH CONTRAST  TECHNIQUE: Multidetector CT imaging of the abdomen and pelvis was performed using the standard protocol following bolus administration of intravenous contrast.  CONTRAST:  OMNIPAQUE IOHEXOL 300 MG/ML  SOLN  COMPARISON:  Prior abdominal ultrasound 08/06/2009  FINDINGS: Lower Chest: The lung bases are clear. Visualized cardiac structures are within normal limits for size. No pericardial effusion. Unremarkable visualized distal thoracic esophagus.  Abdomen: Unremarkable CT appearance of the stomach, spleen and adrenal glands. The pancreas appears edematous, particularly in the pancreatic head and uncinate process, and also at the junction of the  pancreatic body and tail. There is extensive inflammatory stranding throughout the peripancreatic fat as well as peripancreatic fluid which tracks up into the left subdiaphragmatic space and down the the bilateral pericolic gutters. Edema extends into the gastrosplenic ligament and root of the mesentery. No discrete fluid collection to suggest pseudocyst or abscess.  Normal hepatic contour and morphology. Gallbladder is unremarkable. No intra or extrahepatic biliary ductal dilatation. Unremarkable appearance of the bilateral kidneys. No focal solid lesion, hydronephrosis or nephrolithiasis.  Mild circumferential thickening of the descending duodenum is likely reactive. No evidence of bowel obstruction. Normal appendix in the right hemi abdomen. No evidence of free air.  Pelvis: Unremarkable bladder, prostate gland and seminal vesicles. No free fluid or suspicious adenopathy.  Bones/Soft Tissues: No acute fracture or aggressive appearing lytic or blastic osseous lesion.  Vascular: Normal patency of the splenic and portal veins. No evidence of significant atherosclerotic plaque or aneurysm. Incidental note is made of a circumaortic left renal vein.  IMPRESSION: 1. CT findings are most consistent with acute uncomplicated pancreatitis. There is a moderate amount of peripancreatic stranding and fluid extending into the root of the mesentery, the porta hepatis, the left greater than right pericolic gutters and the gastrosplenic ligament. No evidence of pancreatic necrosis, pseudocyst or vascular complication.   Electronically Signed   By: Malachy Moan M.D.   On: 01/30/2014 17:13    Microbiology: No results found for this or any previous visit (from the past 240 hour(s)).   Labs: Basic Metabolic Panel:  Recent Labs Lab 01/30/14 1521 01/31/14 0506  NA 129* 130*  K 4.0 4.1  CL 94* 94*  CO2 20 26  GLUCOSE 175* 115*  BUN 16 10  CREATININE 1.01 0.83  CALCIUM 8.6 7.8*   Liver Function Tests:  Recent  Labs Lab 01/30/14 1521  AST 49*  ALT 44  ALKPHOS 92  BILITOT 1.6*  PROT 7.8  ALBUMIN 4.4    Recent Labs Lab 01/30/14 1521 01/31/14 0832  LIPASE 616* 508*   No results for input(s): AMMONIA in the last 168 hours. CBC:  Recent Labs Lab 01/30/14 1521 01/31/14 0506  WBC 19.8* 22.4*  HGB 15.9 14.1  HCT 44.8 41.0  MCV 90.3 92.6  PLT 171 121*   Cardiac Enzymes: No results for input(s): CKTOTAL, CKMB, CKMBINDEX, TROPONINI in the last 168 hours. BNP: BNP (last 3 results) No results for input(s): PROBNP in the last 8760 hours. CBG: No results for input(s): GLUCAP in the last 168 hours.  Time coordinating discharge: Over 30 minutes

## 2014-02-01 NOTE — Progress Notes (Signed)
Spoke with mom concerning pt status this morning. Mom made aware of pt room changes . On coming nurse also made aware.

## 2014-02-01 NOTE — Discharge Instructions (Signed)
Acute Pancreatitis Acute pancreatitis is a disease in which the pancreas becomes suddenly inflamed. The pancreas is a large gland located behind your stomach. The pancreas produces enzymes that help digest food. The pancreas also releases the hormones glucagon and insulin that help regulate blood sugar. Damage to the pancreas occurs when the digestive enzymes from the pancreas are activated and begin attacking the pancreas before being released into the intestine. Most acute attacks last a couple of days and can cause serious complications. Some people become dehydrated and develop low blood pressure. In severe cases, bleeding into the pancreas can lead to shock and can be life-threatening. The lungs, heart, and kidneys may fail. CAUSES  Pancreatitis can happen to anyone. In some cases, the cause is unknown. Most cases are caused by:  Alcohol abuse.  Gallstones. Other less common causes are:  Certain medicines.  Exposure to certain chemicals.  Infection.  Damage caused by an accident (trauma).  Abdominal surgery. SYMPTOMS   Pain in the upper abdomen that may radiate to the back.  Tenderness and swelling of the abdomen.  Nausea and vomiting. DIAGNOSIS  Your caregiver will perform a physical exam. Blood and stool tests may be done to confirm the diagnosis. Imaging tests may also be done, such as X-rays, CT scans, or an ultrasound of the abdomen. TREATMENT  Treatment usually requires a stay in the hospital. Treatment may include:  Pain medicine.  Fluid replacement through an intravenous line (IV).  Placing a tube in the stomach to remove stomach contents and control vomiting.  Not eating for 3 or 4 days. This gives your pancreas a rest, because enzymes are not being produced that can cause further damage.  Antibiotic medicines if your condition is caused by an infection.  Surgery of the pancreas or gallbladder. HOME CARE INSTRUCTIONS   Follow the diet advised by your  caregiver. This may involve avoiding alcohol and decreasing the amount of fat in your diet.  Eat smaller, more frequent meals. This reduces the amount of digestive juices the pancreas produces.  Drink enough fluids to keep your urine clear or pale yellow.  Only take over-the-counter or prescription medicines as directed by your caregiver.  Avoid drinking alcohol if it caused your condition.  Do not smoke.  Get plenty of rest.  Check your blood sugar at home as directed by your caregiver.  Keep all follow-up appointments as directed by your caregiver. SEEK MEDICAL CARE IF:   You do not recover as quickly as expected.  You develop new or worsening symptoms.  You have persistent pain, weakness, or nausea.  You recover and then have another episode of pain. SEEK IMMEDIATE MEDICAL CARE IF:   You are unable to eat or keep fluids down.  Your pain becomes severe.  You have a fever or persistent symptoms for more than 2 to 3 days.  You have a fever and your symptoms suddenly get worse. YUrinary Tract Infection Urinary tract infections (UTIs) can develop anywhere along your urinary tract. Your urinary tract is your body's drainage system for removing wastes and extra water. Your urinary tract includes two kidneys, two ureters, a bladder, and a urethra. Your kidneys are a pair of bean-shaped organs. Each kidney is about the size of your fist. They are located below your ribs, one on each side of your spine. CAUSES Infections are caused by microbes, which are microscopic organisms, including fungi, viruses, and bacteria. These organisms are so small that they can only be seen through a microscope.  Bacteria are the microbes that most commonly cause UTIs. SYMPTOMS  Symptoms of UTIs may vary by age and gender of the patient and by the location of the infection. Symptoms in young women typically include a frequent and intense urge to urinate and a painful, burning feeling in the bladder or  urethra during urination. Older women and men are more likely to be tired, shaky, and weak and have muscle aches and abdominal pain. A fever may mean the infection is in your kidneys. Other symptoms of a kidney infection include pain in your back or sides below the ribs, nausea, and vomiting. DIAGNOSIS To diagnose a UTI, your caregiver will ask you about your symptoms. Your caregiver also will ask to provide a urine sample. The urine sample will be tested for bacteria and white blood cells. White blood cells are made by your body to help fight infection. TREATMENT  Typically, UTIs can be treated with medication. Because most UTIs are caused by a bacterial infection, they usually can be treated with the use of antibiotics. The choice of antibiotic and length of treatment depend on your symptoms and the type of bacteria causing your infection. HOME CARE INSTRUCTIONS If you were prescribed antibiotics, take them exactly as your caregiver instructs you. Finish the medication even if you feel better after you have only taken some of the medication. Drink enough water and fluids to keep your urine clear or pale yellow. Avoid caffeine, tea, and carbonated beverages. They tend to irritate your bladder. Empty your bladder often. Avoid holding urine for long periods of time. Empty your bladder before and after sexual intercourse. After a bowel movement, women should cleanse from front to back. Use each tissue only once. SEEK MEDICAL CARE IF:  You have back pain. You develop a fever. Your symptoms do not begin to resolve within 3 days. SEEK IMMEDIATE MEDICAL CARE IF:  You have severe back pain or lower abdominal pain. You develop chills. You have nausea or vomiting. You have continued burning or discomfort with urination. MAKE SURE YOU:  Understand these instructions. Will watch your condition. Will get help right away if you are not doing well or get worse. Document Released: 10/05/2004 Document  Revised: 06/27/2011 Document Reviewed: 02/03/2011 [facility] Patient Information 2015 [place], [place]. This information is not intended to replace advice given to you by your health care provider. Make sure you discuss any questions you have with your health care provider.  our skin or the white part of your eyes turn yellow (jaundice).  You develop vomiting.  You feel dizzy, or you faint.  Your blood sugar is high (over 300 mg/dL). MAKE SURE YOU:   Understand these instructions.  Will watch your condition.  Will get help right away if you are not doing well or get worse. Document Released: 12/26/2004 Document Revised: 06/27/2011 Document Reviewed: 04/06/2011 [facility] Patient Information 2015 [place], [place]. This information is not intended to replace advice given to you by your health care provider. Make sure you discuss any questions you have with your health care provider.

## 2014-02-01 NOTE — Progress Notes (Signed)
Pt has left this morning. Father has picked him up and is aware of discharge instructions. Pt alert and ambulatory.

## 2014-02-01 NOTE — Progress Notes (Signed)
Mr Ian [name]  Became very confuse and disoriented  attempted to leave the floor via the back exit. reoriented patient to time and place, removed patient to room 1501 next to nursing station . Educated pt about the need to call for help before getting in and out of bed.  Bed alarm in used, will continue to monitor.

## 2014-04-22 ENCOUNTER — Other Ambulatory Visit: Payer: Self-pay | Admitting: Family Medicine

## 2014-04-22 DIAGNOSIS — K8500 Idiopathic acute pancreatitis without necrosis or infection: Secondary | ICD-10-CM

## 2014-05-07 ENCOUNTER — Inpatient Hospital Stay: Admission: RE | Admit: 2014-05-07 | Payer: Self-pay | Source: Ambulatory Visit

## 2014-07-19 ENCOUNTER — Emergency Department (HOSPITAL_BASED_OUTPATIENT_CLINIC_OR_DEPARTMENT_OTHER)
Admission: EM | Admit: 2014-07-19 | Discharge: 2014-07-19 | Disposition: A | Discharge status: Home / Self Care | Payer: No Typology Code available for payment source | Attending: Emergency Medicine | Admitting: Emergency Medicine

## 2014-07-19 ENCOUNTER — Encounter (HOSPITAL_BASED_OUTPATIENT_CLINIC_OR_DEPARTMENT_OTHER): Payer: Self-pay

## 2014-07-19 DIAGNOSIS — F329 Major depressive disorder, single episode, unspecified: Secondary | ICD-10-CM | POA: Insufficient documentation

## 2014-07-19 DIAGNOSIS — Z79899 Other long term (current) drug therapy: Secondary | ICD-10-CM | POA: Diagnosis not present

## 2014-07-19 DIAGNOSIS — Z87891 Personal history of nicotine dependence: Secondary | ICD-10-CM | POA: Insufficient documentation

## 2014-07-19 DIAGNOSIS — R0602 Shortness of breath: Secondary | ICD-10-CM | POA: Diagnosis present

## 2014-07-19 DIAGNOSIS — I10 Essential (primary) hypertension: Secondary | ICD-10-CM | POA: Insufficient documentation

## 2014-07-19 DIAGNOSIS — J4531 Mild persistent asthma with (acute) exacerbation: Secondary | ICD-10-CM | POA: Insufficient documentation

## 2014-07-19 DIAGNOSIS — F419 Anxiety disorder, unspecified: Secondary | ICD-10-CM | POA: Diagnosis not present

## 2014-07-19 MED ORDER — ALBUTEROL SULFATE (2.5 MG/3ML) 0.083% IN NEBU: 2.5000 mg | INHALATION_SOLUTION | Freq: Once | RESPIRATORY_TRACT | Status: DC

## 2014-07-19 MED ORDER — ALBUTEROL SULFATE (2.5 MG/3ML) 0.083% IN NEBU
2.5000 mg | INHALATION_SOLUTION | Freq: Once | RESPIRATORY_TRACT | Status: AC
  Administered 2014-07-19: 2.5 mg via RESPIRATORY_TRACT

## 2014-07-19 MED ORDER — IPRATROPIUM-ALBUTEROL 0.5-2.5 (3) MG/3ML IN SOLN
3.0000 mL | Freq: Once | RESPIRATORY_TRACT | Status: AC
  Administered 2014-07-19: 3 mL via RESPIRATORY_TRACT

## 2014-07-19 MED ORDER — ALBUTEROL SULFATE HFA 108 (90 BASE) MCG/ACT IN AERS
2.0000 | INHALATION_SPRAY | RESPIRATORY_TRACT | Status: DC | PRN
  Administered 2014-07-19: 2 via RESPIRATORY_TRACT
  Filled 2014-07-19: qty 6.7

## 2014-07-19 MED ORDER — PREDNISONE 50 MG PO TABS
60.0000 mg | ORAL_TABLET | Freq: Once | ORAL | Status: AC
  Administered 2014-07-19: 60 mg via ORAL
  Filled 2014-07-19 (×2): qty 1

## 2014-07-19 MED ORDER — IPRATROPIUM-ALBUTEROL 0.5-2.5 (3) MG/3ML IN SOLN
RESPIRATORY_TRACT | Status: AC
  Administered 2014-07-19: 3 mL via RESPIRATORY_TRACT
  Filled 2014-07-19: qty 3

## 2014-07-19 MED ORDER — PREDNISONE 20 MG PO TABS: 60.0000 mg | ORAL_TABLET | Freq: Every day | ORAL | Status: AC

## 2014-07-19 MED ORDER — ALBUTEROL SULFATE (2.5 MG/3ML) 0.083% IN NEBU
INHALATION_SOLUTION | RESPIRATORY_TRACT | Status: AC
  Administered 2014-07-19: 2.5 mg via RESPIRATORY_TRACT
  Filled 2014-07-19: qty 3

## 2014-07-19 NOTE — ED Notes (Signed)
Pt reports hx of asthma and out of inhalers.  Denies infectious symptoms but reports some wheezing and sob with exertion.

## 2014-07-19 NOTE — ED Provider Notes (Signed)
CSN: [id number]     Arrival date & time 07/19/14  1350 History  This chart was scribed for Margarita Grizzle, MD by Abel Presto, ED Scribe. This patient was seen in room MH05/MH05 and the patient's care was started at 3:58 PM.     Chief Complaint  Patient presents with  . Asthma     The history is provided by the patient. No language interpreter was used.   HPI Comments: Ian [name] is a 34 y.o. male who presents to the Emergency Department complaining of SOB, wheezing, and unproductive cough with onset 4-5 days ago, worsening last 2 days. Pt reports h/o of asthma and states this feels like a exacerbation. Pt has tried his albuterol inhaler as frequent as every hour with no relief. Pt has a nebulizer at home but does not have medication for it and is out of refills for Advair. He states he has not been hospitalized since childhood. Pt denies fever and chills. Pt's PCP is Dr. Hyman Hopes.   Past Medical History  Diagnosis Date  . Anxiety   . Depression   . Hypertension   . Asthma   . Alcohol dependence    Past Surgical History  Procedure Laterality Date  . No past surgeries     Family History  Problem Relation Age of Onset  . OCD Mother   . Suicidality Neg Hx    History  Substance Use Topics  . Smoking status: Former Smoker    Types: Cigarettes    Quit date: 01/31/1999  . Smokeless tobacco: Never Used  . Alcohol Use: No     Comment: varies how much    Review of Systems  Constitutional: Negative for fever and chills.  Respiratory: Positive for cough, shortness of breath and wheezing.   All other systems reviewed and are negative.     Allergies  Review of patient's allergies indicates no known allergies.  Home Medications   Prior to Admission medications   Medication Sig Start Date End Date Taking? Authorizing Provider  albuterol (PROVENTIL HFA;VENTOLIN HFA) 108 (90 BASE) MCG/ACT inhaler Inhale 2 puffs into the lungs every 6 (six) hours as needed. For shortness of  breath 08/22/13   Beau Fanny, FNP  ALPRAZolam Prudy Feeler) 0.5 MG tablet Take 0.25 mg by mouth 3 (three) times daily as needed for anxiety.    Historical Provider, MD  citalopram (CELEXA) 40 MG tablet Take 1 tablet (40 mg total) by mouth daily. 10/14/13   Oletta Darter, MD  cyclobenzaprine (FLEXERIL) 10 MG tablet Take 1 tablet (10 mg total) by mouth at bedtime as needed for muscle spasms. 08/22/13   Beau Fanny, FNP  divalproex (DEPAKOTE) 500 MG DR tablet Take 1 tablet (500 mg total) by mouth 2 (two) times daily. 10/14/13   Oletta Darter, MD  folic acid (FOLVITE) 1 MG tablet Take 1 tablet (1 mg total) by mouth daily. 02/01/14   Alison Murray, MD  hydrOXYzine (ATARAX/VISTARIL) 50 MG tablet Take 1 tablet (50 mg total) by mouth 3 (three) times daily as needed for anxiety. 10/14/13   Oletta Darter, MD  labetalol (NORMODYNE) 100 MG tablet Take 1 tablet (100 mg total) by mouth 2 (two) times daily. 02/01/14   Alison Murray, MD  Multiple Vitamin (MULTIVITAMIN WITH MINERALS) TABS tablet Take 1 tablet by mouth daily. 02/01/14   Alison Murray, MD  nitrofurantoin, macrocrystal-monohydrate, (MACROBID) 100 MG capsule Take 1 capsule (100 mg total) by mouth 2 (two) times daily. 02/01/14   Alma  Concepcion ElkM Devine, MD  oxyCODONE-acetaminophen (PERCOCET) 5-325 MG per tablet Take 1 tablet by mouth every 4 (four) hours as needed for severe pain. 01/31/14   Alison MurrayAlma M Devine, MD  thiamine 100 MG tablet Take 1 tablet (100 mg total) by mouth daily. 02/01/14   Alison MurrayAlma M Devine, MD  traZODone (DESYREL) 100 MG tablet Take 1 tablet (100 mg total) by mouth at bedtime as needed. For sleep 10/14/13 01/30/14  Oletta DarterSalina Agarwal, MD   BP 120/83 mmHg  Pulse 84  Temp(Src) 98 F (36.7 C) (Oral)  Resp 18  Ht 6' (1.829 m)  Wt 220 lb (99.791 kg)  BMI 29.83 kg/m2  SpO2 98% Physical Exam  Constitutional: He is oriented to person, place, and time. He appears well-developed and well-nourished.  HENT:  Head: Normocephalic.  Eyes: Conjunctivae are normal.   Neck: Normal range of motion. Neck supple.  Cardiovascular: Normal rate, regular rhythm and normal heart sounds.  Exam reveals no friction rub.   No murmur heard. Pulmonary/Chest: Effort normal. No respiratory distress. He has wheezes (end expiratory wheezes). He has no rales.  Musculoskeletal: Normal range of motion.  Neurological: He is alert and oriented to person, place, and time.  Skin: Skin is warm and dry.  Psychiatric: He has a normal mood and affect. His behavior is normal.  Nursing note and vitals reviewed.   ED Course  Procedures (including critical care time) DIAGNOSTIC STUDIES: Oxygen Saturation is 98% on room air, normal by my interpretation.    COORDINATION OF CARE: 4:01 PM Discussed treatment plan with patient at beside, the patient agrees with the plan and has no further questions at this time.   Labs Review Labs Reviewed - No data to display  Imaging Review No results found.   EKG Interpretation None      MDM   Final diagnoses:  Asthma, mild persistent, with acute exacerbation     I personally performed the services described in this documentation, which was scribed in my presence. The recorded information has been reviewed and considered.      Margarita Grizzleanielle Missey Hasley, MD 07/19/14 2242

## 2014-07-19 NOTE — Discharge Instructions (Signed)

## 2014-08-22 ENCOUNTER — Emergency Department (HOSPITAL_COMMUNITY)
Admission: EM | Admit: 2014-08-22 | Discharge: 2014-08-23 | Disposition: A | Discharge status: Other Acute Inpatient Hospital | Payer: No Typology Code available for payment source | Attending: Emergency Medicine | Admitting: Emergency Medicine

## 2014-08-22 ENCOUNTER — Encounter (HOSPITAL_COMMUNITY): Payer: Self-pay | Admitting: *Deleted

## 2014-08-22 ENCOUNTER — Encounter (HOSPITAL_COMMUNITY): Payer: Self-pay | Admitting: Emergency Medicine

## 2014-08-22 ENCOUNTER — Ambulatory Visit (HOSPITAL_COMMUNITY)
Admission: RE | Admit: 2014-08-22 | Discharge: 2014-08-22 | Disposition: A | Discharge status: Home / Self Care | Payer: No Typology Code available for payment source | Source: Home / Self Care | Attending: Psychiatry | Admitting: Psychiatry

## 2014-08-22 DIAGNOSIS — F419 Anxiety disorder, unspecified: Secondary | ICD-10-CM | POA: Diagnosis not present

## 2014-08-22 DIAGNOSIS — F10920 Alcohol use, unspecified with intoxication, uncomplicated: Secondary | ICD-10-CM

## 2014-08-22 DIAGNOSIS — Z008 Encounter for other general examination: Secondary | ICD-10-CM | POA: Diagnosis present

## 2014-08-22 DIAGNOSIS — R45851 Suicidal ideations: Secondary | ICD-10-CM

## 2014-08-22 DIAGNOSIS — F1994 Other psychoactive substance use, unspecified with psychoactive substance-induced mood disorder: Secondary | ICD-10-CM | POA: Diagnosis present

## 2014-08-22 DIAGNOSIS — F329 Major depressive disorder, single episode, unspecified: Secondary | ICD-10-CM | POA: Insufficient documentation

## 2014-08-22 DIAGNOSIS — F10120 Alcohol abuse with intoxication, uncomplicated: Secondary | ICD-10-CM | POA: Insufficient documentation

## 2014-08-22 DIAGNOSIS — J45909 Unspecified asthma, uncomplicated: Secondary | ICD-10-CM | POA: Insufficient documentation

## 2014-08-22 DIAGNOSIS — Y908 Blood alcohol level of 240 mg/100 ml or more: Secondary | ICD-10-CM | POA: Diagnosis not present

## 2014-08-22 DIAGNOSIS — Z79899 Other long term (current) drug therapy: Secondary | ICD-10-CM | POA: Insufficient documentation

## 2014-08-22 DIAGNOSIS — Z87891 Personal history of nicotine dependence: Secondary | ICD-10-CM | POA: Diagnosis not present

## 2014-08-22 DIAGNOSIS — I10 Essential (primary) hypertension: Secondary | ICD-10-CM | POA: Insufficient documentation

## 2014-08-22 DIAGNOSIS — F1020 Alcohol dependence, uncomplicated: Secondary | ICD-10-CM | POA: Diagnosis present

## 2014-08-22 LAB — COMPREHENSIVE METABOLIC PANEL
ALBUMIN: 4.2 g/dL (ref 3.5–5.0)
ALK PHOS: 80 U/L (ref 38–126)
ALT: 30 U/L (ref 17–63)
ANION GAP: 12 (ref 5–15)
AST: 31 U/L (ref 15–41)
BUN: 10 mg/dL (ref 6–20)
CHLORIDE: 105 mmol/L (ref 101–111)
CO2: 26 mmol/L (ref 22–32)
CREATININE: 0.86 mg/dL (ref 0.61–1.24)
Calcium: 8.7 mg/dL — ABNORMAL LOW (ref 8.9–10.3)
GFR calc Af Amer: >60 mL/min (ref >60)
Glucose, Bld: 103 mg/dL — ABNORMAL HIGH (ref 65–99)
Potassium: 3.7 mmol/L (ref 3.5–5.1)
SODIUM: 143 mmol/L (ref 135–145)
Total Bilirubin: 0.6 mg/dL (ref 0.3–1.2)
Total Protein: 7.4 g/dL (ref 6.5–8.1)

## 2014-08-22 LAB — RAPID URINE DRUG SCREEN, HOSP PERFORMED
Amphetamines: NOT DETECTED
BARBITURATES: NOT DETECTED
Benzodiazepines: NOT DETECTED
COCAINE: NOT DETECTED
OPIATES: NOT DETECTED
TETRAHYDROCANNABINOL: NOT DETECTED

## 2014-08-22 LAB — CBC
HCT: 41.1 % (ref 39.0–52.0)
HEMOGLOBIN: 13.7 g/dL (ref 13.0–17.0)
MCH: 31.1 pg (ref 26.0–34.0)
MCHC: 33.3 g/dL (ref 30.0–36.0)
MCV: 93.2 fL (ref 78.0–100.0)
PLATELETS: 115 10*3/uL — AB (ref 150–400)
RBC: 4.41 MIL/uL (ref 4.22–5.81)
RDW: 14.2 % (ref 11.5–15.5)
WBC: 4.8 10*3/uL (ref 4.0–10.5)

## 2014-08-22 LAB — ETHANOL: Alcohol, Ethyl (B): 446 mg/dL (ref <5)

## 2014-08-22 LAB — SALICYLATE LEVEL

## 2014-08-22 LAB — ACETAMINOPHEN LEVEL: Acetaminophen (Tylenol), Serum: <10 ug/mL — ABNORMAL LOW (ref 10–30)

## 2014-08-22 MED ORDER — FOLIC ACID 1 MG PO TABS
1.0000 mg | ORAL_TABLET | Freq: Every day | ORAL | Status: DC
  Administered 2014-08-23: 1 mg via ORAL
  Filled 2014-08-22: qty 1

## 2014-08-22 MED ORDER — THIAMINE HCL 100 MG/ML IJ SOLN: 100.0000 mg | Freq: Every day | INTRAMUSCULAR | Status: DC

## 2014-08-22 MED ORDER — CITALOPRAM HYDROBROMIDE 40 MG PO TABS
40.0000 mg | ORAL_TABLET | Freq: Every day | ORAL | Status: DC
  Administered 2014-08-23: 40 mg via ORAL
  Filled 2014-08-22: qty 1

## 2014-08-22 MED ORDER — ACETAMINOPHEN 325 MG PO TABS: 650.0000 mg | ORAL_TABLET | ORAL | Status: DC | PRN

## 2014-08-22 MED ORDER — IBUPROFEN 200 MG PO TABS: 600.0000 mg | ORAL_TABLET | Freq: Three times a day (TID) | ORAL | Status: DC | PRN

## 2014-08-22 MED ORDER — ONDANSETRON HCL 4 MG PO TABS: 4.0000 mg | ORAL_TABLET | Freq: Three times a day (TID) | ORAL | Status: DC | PRN

## 2014-08-22 MED ORDER — LORAZEPAM 1 MG PO TABS
1.0000 mg | ORAL_TABLET | Freq: Four times a day (QID) | ORAL | Status: DC | PRN
  Administered 2014-08-23 (×2): 1 mg via ORAL
  Filled 2014-08-22 (×2): qty 1

## 2014-08-22 MED ORDER — ADULT MULTIVITAMIN W/MINERALS CH
1.0000 | ORAL_TABLET | Freq: Every day | ORAL | Status: DC
  Administered 2014-08-23: 1 via ORAL
  Filled 2014-08-22: qty 1

## 2014-08-22 MED ORDER — ZOLPIDEM TARTRATE 5 MG PO TABS: 5.0000 mg | ORAL_TABLET | Freq: Every evening | ORAL | Status: DC | PRN

## 2014-08-22 MED ORDER — NICOTINE 21 MG/24HR TD PT24: 21.0000 mg | MEDICATED_PATCH | Freq: Every day | TRANSDERMAL | Status: DC | PRN

## 2014-08-22 MED ORDER — ALUM & MAG HYDROXIDE-SIMETH 200-200-20 MG/5ML PO SUSP: 30.0000 mL | ORAL | Status: DC | PRN

## 2014-08-22 MED ORDER — DIVALPROEX SODIUM 500 MG PO DR TAB
500.0000 mg | DELAYED_RELEASE_TABLET | Freq: Two times a day (BID) | ORAL | Status: DC
  Administered 2014-08-23: 500 mg via ORAL
  Filled 2014-08-22: qty 1

## 2014-08-22 MED ORDER — VITAMIN B-1 100 MG PO TABS
100.0000 mg | ORAL_TABLET | Freq: Every day | ORAL | Status: DC
  Administered 2014-08-23: 100 mg via ORAL
  Filled 2014-08-22: qty 1

## 2014-08-22 MED ORDER — TRAZODONE HCL 100 MG PO TABS: 100.0000 mg | ORAL_TABLET | Freq: Every evening | ORAL | Status: DC | PRN

## 2014-08-22 MED ORDER — LORAZEPAM 2 MG/ML IJ SOLN: 1.0000 mg | Freq: Four times a day (QID) | INTRAMUSCULAR | Status: DC | PRN

## 2014-08-22 NOTE — ED Provider Notes (Signed)
CSN: [id number]     Arrival date & time 08/22/14  1826 History   First MD Initiated Contact with Patient 08/22/14 1956     Chief Complaint  Patient presents with  . Medical Clearance    detox  . Alcohol Problem     (Consider location/radiation/quality/duration/timing/severity/associated sxs/prior Treatment) The history is provided by the patient.    Ian [name] is a 34 y.o. male who [facility], after a walk-in visit, there for, evaluation of depression, suicidal ideation and alcoholism. After evaluation there, the counselor recommended that he come here for "medical clearance and evaluation by psychiatry". He states that he drinks 1-1/2 gallons of vodka every 2 days. He denies fever, chills, cough, chest pain, nausea, vomiting, weakness or dizziness. He states that he is not in legal trouble. He states that he drove to the behavioral health Hospital for evaluation today. He states that he is suicidal, but does not have a plan. He is not homicidal. He gets his medications, from [facility]. There are no other known modifying factors.    Past Medical History  Diagnosis Date  . Anxiety   . Depression   . Hypertension   . Asthma   . Alcohol dependence    Past Surgical History  Procedure Laterality Date  . No past surgeries     Family History  Problem Relation Age of Onset  . OCD Mother   . Suicidality Neg Hx    Social History  Substance Use Topics  . Smoking status: Former Smoker    Types: Cigarettes    Quit date: 01/31/1999  . Smokeless tobacco: Never Used  . Alcohol Use: Yes     Comment: varies how much    Review of Systems  All other systems reviewed and are negative.     Allergies  Review of patient's allergies indicates no known allergies.  Home Medications   Prior to Admission medications   Medication Sig Start Date End Date Taking? Authorizing Provider  albuterol (PROVENTIL) (2.5 MG/3ML) 0.083% nebulizer solution Take  3 mLs (2.5 mg total) by nebulization once. Patient taking differently: Take 2.5 mg by nebulization every 6 (six) hours as needed for wheezing or shortness of breath.  07/19/14  Yes Margarita Grizzle, MD  ALPRAZolam Prudy Feeler) 0.5 MG tablet Take 0.25 mg by mouth 3 (three) times daily as needed for anxiety.   Yes Historical Provider, MD  citalopram (CELEXA) 40 MG tablet Take 1 tablet (40 mg total) by mouth daily. 10/14/13  Yes Oletta Darter, MD  divalproex (DEPAKOTE) 500 MG DR tablet Take 1 tablet (500 mg total) by mouth 2 (two) times daily. 10/14/13  Yes Oletta Darter, MD  folic acid (FOLVITE) 1 MG tablet Take 1 tablet (1 mg total) by mouth daily. 02/01/14  Yes Alison Murray, MD  hydrOXYzine (ATARAX/VISTARIL) 50 MG tablet Take 1 tablet (50 mg total) by mouth 3 (three) times daily as needed for anxiety. 10/14/13  Yes Oletta Darter, MD  traZODone (DESYREL) 100 MG tablet Take 1 tablet (100 mg total) by mouth at bedtime as needed. For sleep 10/14/13 08/22/14 Yes Oletta Darter, MD  labetalol (NORMODYNE) 100 MG tablet Take 1 tablet (100 mg total) by mouth 2 (two) times daily. Patient not taking: Reported on 08/22/2014 02/01/14   Alison Murray, MD  Multiple Vitamin (MULTIVITAMIN WITH MINERALS) TABS tablet Take 1 tablet by mouth daily. Patient not taking: Reported on 08/22/2014 02/01/14   Alison Murray, MD  thiamine 100 MG tablet Take  1 tablet (100 mg total) by mouth daily. Patient not taking: Reported on 08/22/2014 02/01/14   Alison Murray, MD   BP 117/83 mmHg  Pulse 94  Temp(Src) 97.9 F (36.6 C) (Oral)  Resp 18  SpO2 96% Physical Exam  Constitutional: He is oriented to person, place, and time. He appears well-developed and well-nourished.  HENT:  Head: Normocephalic and atraumatic.  Right Ear: External ear normal.  Left Ear: External ear normal.  Eyes: Conjunctivae and EOM are normal. Pupils are equal, round, and reactive to light.  Neck: Normal range of motion and phonation normal. Neck supple.   Cardiovascular: Normal rate, regular rhythm and normal heart sounds.   Pulmonary/Chest: Effort normal and breath sounds normal. He exhibits no bony tenderness.  Abdominal: Soft. There is no tenderness.  Musculoskeletal: Normal range of motion.  Neurological: He is alert and oriented to person, place, and time. No cranial nerve deficit or sensory deficit. He exhibits normal muscle tone. Coordination normal.  Dysarthria and ataxia, consistent with alcohol intoxication. No aphasia. He is somewhat lethargic.  Skin: Skin is warm, dry and intact.  Psychiatric: He has a normal mood and affect. His behavior is normal. Judgment and thought content normal.  Nursing note and vitals reviewed.   ED Course  Procedures (including critical care time)  Medications - No data to display  Patient Vitals for the past 24 hrs:  BP Temp Temp src Pulse Resp SpO2  08/22/14 1850 117/83 mmHg 97.9 F (36.6 C) Oral 94 18 96 %    20:10- he is medically cleared for treatment by psychiatry in TTS, there is no history of alcohol withdrawal syndrome.  TTS consultation  Labs Review Labs Reviewed  CBC - Abnormal; Notable for the following:    Platelets 115 (*)    All other components within normal limits  COMPREHENSIVE METABOLIC PANEL  ETHANOL  SALICYLATE LEVEL  ACETAMINOPHEN LEVEL  URINE RAPID DRUG SCREEN, HOSP PERFORMED    Imaging Review No results found. I, Vyom Brass L, personally reviewed and evaluated these images and lab results as part of my medical decision-making.   EKG Interpretation None      MDM   Final diagnoses:  Alcohol intoxication, uncomplicated  Suicide ideation    Alcoholism with intoxication. Suicidal ideation, without suicidal plan.  Nursing Notes Reviewed/ Care Coordinated, and agree without changes. Applicable Imaging Reviewed.  Interpretation of Laboratory Data incorporated into ED treatment  Plan- as per TTS, with oncoming provider team  Mancel Bale,  MD 08/22/14 2356

## 2014-08-22 NOTE — BHH Counselor (Signed)
TTS consult received at 20:11. Pt was already assessed by counselor at [facility] at 18:11 because pt presented as a walk-in to [facility] this evening.  Per Serena Colonel, NP, pt was sent to [facility] for medical clearance and further evaluation by psychiatry in the morning is recommended.  Dr Effie Shy notified of disposition.    Cyndie Mull, [facility] Triage Specialist
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
## 2014-08-22 NOTE — ED Notes (Addendum)
Patient here for alcohol detox. Patient has been accepted at [facility]. Here for medical clearance. Last drink today around 3pm. Reports SI.

## 2014-08-23 ENCOUNTER — Encounter (HOSPITAL_COMMUNITY): Payer: Self-pay | Admitting: *Deleted

## 2014-08-23 ENCOUNTER — Inpatient Hospital Stay (HOSPITAL_COMMUNITY)
Admission: AD | Admit: 2014-08-23 | Discharge: 2014-08-26 | DRG: 897 | Disposition: A | Discharge status: Home / Self Care | Payer: No Typology Code available for payment source | Source: Intra-hospital | Attending: Psychiatry | Admitting: Psychiatry

## 2014-08-23 DIAGNOSIS — F331 Major depressive disorder, recurrent, moderate: Secondary | ICD-10-CM

## 2014-08-23 DIAGNOSIS — Y908 Blood alcohol level of 240 mg/100 ml or more: Secondary | ICD-10-CM | POA: Diagnosis present

## 2014-08-23 DIAGNOSIS — G4700 Insomnia, unspecified: Secondary | ICD-10-CM | POA: Diagnosis present

## 2014-08-23 DIAGNOSIS — F1020 Alcohol dependence, uncomplicated: Secondary | ICD-10-CM | POA: Diagnosis present

## 2014-08-23 DIAGNOSIS — F1010 Alcohol abuse, uncomplicated: Secondary | ICD-10-CM | POA: Diagnosis present

## 2014-08-23 DIAGNOSIS — F411 Generalized anxiety disorder: Secondary | ICD-10-CM | POA: Diagnosis not present

## 2014-08-23 DIAGNOSIS — I10 Essential (primary) hypertension: Secondary | ICD-10-CM | POA: Diagnosis present

## 2014-08-23 DIAGNOSIS — R45851 Suicidal ideations: Secondary | ICD-10-CM | POA: Diagnosis present

## 2014-08-23 DIAGNOSIS — F1994 Other psychoactive substance use, unspecified with psychoactive substance-induced mood disorder: Secondary | ICD-10-CM | POA: Diagnosis present

## 2014-08-23 DIAGNOSIS — Z87891 Personal history of nicotine dependence: Secondary | ICD-10-CM

## 2014-08-23 DIAGNOSIS — F10120 Alcohol abuse with intoxication, uncomplicated: Secondary | ICD-10-CM | POA: Diagnosis not present

## 2014-08-23 MED ORDER — CHLORDIAZEPOXIDE HCL 25 MG PO CAPS: 25.0000 mg | ORAL_CAPSULE | Freq: Every day | ORAL | Status: DC

## 2014-08-23 MED ORDER — THIAMINE HCL 100 MG/ML IJ SOLN: 100.0000 mg | Freq: Once | INTRAMUSCULAR | Status: DC

## 2014-08-23 MED ORDER — MAGNESIUM HYDROXIDE 400 MG/5ML PO SUSP: 30.0000 mL | Freq: Every day | ORAL | Status: DC | PRN

## 2014-08-23 MED ORDER — HYDROXYZINE HCL 25 MG PO TABS
25.0000 mg | ORAL_TABLET | Freq: Four times a day (QID) | ORAL | Status: DC | PRN
  Administered 2014-08-23 – 2014-08-26 (×5): 25 mg via ORAL
  Filled 2014-08-23: qty 12
  Filled 2014-08-23 (×3): qty 1
  Filled 2014-08-23: qty 12
  Filled 2014-08-23 (×2): qty 1

## 2014-08-23 MED ORDER — ALUM & MAG HYDROXIDE-SIMETH 200-200-20 MG/5ML PO SUSP: 30.0000 mL | ORAL | Status: DC | PRN

## 2014-08-23 MED ORDER — LOPERAMIDE HCL 2 MG PO CAPS: 2.0000 mg | ORAL_CAPSULE | ORAL | Status: DC | PRN

## 2014-08-23 MED ORDER — CHLORDIAZEPOXIDE HCL 25 MG PO CAPS
25.0000 mg | ORAL_CAPSULE | Freq: Three times a day (TID) | ORAL | Status: AC
  Administered 2014-08-25 (×3): 25 mg via ORAL
  Filled 2014-08-23 (×4): qty 1

## 2014-08-23 MED ORDER — ACETAMINOPHEN 325 MG PO TABS
650.0000 mg | ORAL_TABLET | Freq: Four times a day (QID) | ORAL | Status: DC | PRN
  Administered 2014-08-24 – 2014-08-25 (×2): 650 mg via ORAL
  Filled 2014-08-23 (×2): qty 2

## 2014-08-23 MED ORDER — CHLORDIAZEPOXIDE HCL 25 MG PO CAPS
25.0000 mg | ORAL_CAPSULE | ORAL | Status: DC
  Administered 2014-08-26: 25 mg via ORAL
  Filled 2014-08-23: qty 1

## 2014-08-23 MED ORDER — ALBUTEROL SULFATE HFA 108 (90 BASE) MCG/ACT IN AERS
1.0000 | INHALATION_SPRAY | Freq: Four times a day (QID) | RESPIRATORY_TRACT | Status: DC | PRN
  Administered 2014-08-23: 1 via RESPIRATORY_TRACT
  Filled 2014-08-23: qty 6.7

## 2014-08-23 MED ORDER — ALBUTEROL SULFATE HFA 108 (90 BASE) MCG/ACT IN AERS
INHALATION_SPRAY | RESPIRATORY_TRACT | Status: AC
  Administered 2014-08-23: 19:00:00
  Filled 2014-08-23: qty 6.7

## 2014-08-23 MED ORDER — CHLORDIAZEPOXIDE HCL 25 MG PO CAPS
25.0000 mg | ORAL_CAPSULE | Freq: Four times a day (QID) | ORAL | Status: DC | PRN
  Administered 2014-08-23 – 2014-08-24 (×2): 25 mg via ORAL
  Filled 2014-08-23: qty 1

## 2014-08-23 MED ORDER — TRAZODONE HCL 100 MG PO TABS
100.0000 mg | ORAL_TABLET | Freq: Every evening | ORAL | Status: DC | PRN
  Administered 2014-08-23 – 2014-08-25 (×4): 100 mg via ORAL
  Filled 2014-08-23: qty 3
  Filled 2014-08-23 (×3): qty 1

## 2014-08-23 MED ORDER — VITAMIN B-1 100 MG PO TABS
100.0000 mg | ORAL_TABLET | Freq: Every day | ORAL | Status: DC
  Administered 2014-08-23 – 2014-08-26 (×4): 100 mg via ORAL
  Filled 2014-08-23 (×6): qty 1

## 2014-08-23 MED ORDER — ADULT MULTIVITAMIN W/MINERALS CH
1.0000 | ORAL_TABLET | Freq: Every day | ORAL | Status: DC
  Administered 2014-08-23 – 2014-08-26 (×4): 1 via ORAL
  Filled 2014-08-23 (×7): qty 1

## 2014-08-23 MED ORDER — ALBUTEROL SULFATE HFA 108 (90 BASE) MCG/ACT IN AERS
2.0000 | INHALATION_SPRAY | RESPIRATORY_TRACT | Status: DC | PRN
  Administered 2014-08-23 – 2014-08-25 (×2): 2 via RESPIRATORY_TRACT

## 2014-08-23 MED ORDER — CHLORDIAZEPOXIDE HCL 25 MG PO CAPS
25.0000 mg | ORAL_CAPSULE | Freq: Four times a day (QID) | ORAL | Status: AC
  Administered 2014-08-23 – 2014-08-24 (×6): 25 mg via ORAL
  Filled 2014-08-23 (×6): qty 1

## 2014-08-23 MED ORDER — ONDANSETRON 4 MG PO TBDP: 4.0000 mg | ORAL_TABLET | Freq: Four times a day (QID) | ORAL | Status: DC | PRN

## 2014-08-23 NOTE — ED Notes (Signed)
Dr Tenny Craw and Renata Caprice NP into see.  Pt reports that his alcohol use is getting out of control and that he has been drinking approx 1/2 gallon vodka for the past yr.  Pt reports that he has [facility].  Pt also reports a hx of anxiety and depression that has gotten worse w/ his ETOH.  Pt has never been treated for depression in the past..

## 2014-08-23 NOTE — Progress Notes (Addendum)
Patient ID: Ian [name], male   DOB: [birth year], 34 y.o.   MRN: [id number] Pt is a 34 year old voluntary admit to [facility]. He has a hx of: HTN, asthma, depression, anxiety , acute pancreatitis. Pt admits to drinking times thirteen years and stated usually he drinks 1.5 gallons of vodka a day. His alcohol level on 8/13 was 446. Pt presents very nervous and shaky. Pt has NKDA. Pt  Stated he has financial problems and plans to live with his parents and his 19 year old son. Pt denies SI and HI and contracts for safety. 6;15p-Pt was given a total of 50 mg of librium for extreme feelings of anxiousness and tremors. He also was given 2 puffs of albuterol inhaler for his asthma.

## 2014-08-23 NOTE — ED Notes (Signed)
Report given to Janie RN

## 2014-08-23 NOTE — ED Notes (Signed)
Up to the bathroom 

## 2014-08-23 NOTE — ED Notes (Signed)
[facility]

## 2014-08-23 NOTE — ED Notes (Signed)
Pt has been accepted [facility] will call back when bed is available

## 2014-08-23 NOTE — ED Notes (Signed)
Pt is stating that ativan he was given earlier Is "helping a little but I need something to help me with alcohol cravings". Pt made aware that psychiatry will be rounding soon and he cn address this question with them.

## 2014-08-23 NOTE — Consult Note (Signed)
[facility] Consult   Reason for Consult:  Alcohol withdrawal and suicidal ideation Referring Physician:  EDP Patient Identification: Ian [name] MRN:  [id number] Principal Diagnosis: Alcohol use disorder, severe, dependence Diagnosis:   Patient Active Problem List   Diagnosis Date Noted  . Alcohol use disorder, severe, dependence [F10.20] 08/23/2014    Priority: High  . Substance induced mood disorder [F19.94] 08/23/2014    Priority: High  . Suicidal ideation [R45.851] 08/23/2014    Priority: High  . Pancreatitis [K85.9] 01/30/2014  . Acute pancreatitis [K85.9] 01/30/2014  . Abdominal pain [R10.9] 01/30/2014  . Nausea and vomiting [R11.2] 01/30/2014  . Hyponatremia [E87.1] 01/30/2014  . Alcohol abuse [F10.10] 01/30/2014  . Leukocytosis [D72.829]   . Nicotine abuse [F19.10] 08/14/2013  . GAD (generalized anxiety disorder) [F41.1] 08/14/2013  . Alcohol dependence in remission [F10.21] 08/14/2013  . Major depressive disorder, recurrent episode, moderate [F33.1] 08/14/2013  . Alcohol dependence [F10.20] 07/04/2011    Class: Acute  . ABDOMINAL PAIN OTHER SPECIFIED SITE [R10.84] 08/05/2009  . CHEST PAIN [R07.9] 01/19/2009  . ANXIETY STATE, UNSPECIFIED [F41.1] 07/31/2008  . CERUMEN IMPACTION [H61.20] 07/31/2008  . ASTHMA [J45.909] 07/31/2008    Total Time spent with patient: 25 minutes  Subjective:   Ian [name] is a 34 y.o. male patient admitted with reports of chronic severe alcoholism with suicidal ideation. Pt seen and chart reviewed by NP and MD team. Pt continues to report severe major depression. Pt has a BAL of 446 and is on the CIWA protocol. Pt does clearly deny homicidal ideation and psychosis and does not appear to be responding to internal stimuli.   HPI:   Ian [name] is an 34 y.o. male who presents to [facility] as a Walk In with the presenting problem of suicidal ideations and excessive alcohol consumption.  Patient was observed to be under the influence of alcohol during the assessment AEB slurred speech and limited eye contact. He stated he is currently having suicidal thoughts due to feeling as if he is failure in life. He reports that his alcohol use has strained his relationship with others and that his parents see him as a disappointment. Patient reports that he has struggled with depression since the age of 34 years old and now currently rates his depression as a 8 (on a scale from 1-10 with 10 representing extreme depression). He states that he has not attempted suicide in the past but has received inpatient treatment for his alcoholism at [facility] in 2011. Patient reports depressive symptoms that include: insomnia, guilt, feelings of angry/irritability, and fatigue. He shares that he consumes alcohol daily and has weeks where he binges on a variety of different amounts. Patient reports no current outpatient providers at this time but states that he currently is taking Citalopram, Depakote, and Folic Acid. Clinician consulted with Agustina Caroli, NP who recommends for patient to be transported to [facility] for medical clearance and further evaluation by psychiatry.   HPI Elements:   Location:  Psychiatric. Quality:  Worsening. Severity:  Severe. Timing:  Constant. Duration:  Chronic. Context:  Exacerbation of underlying alcohol dependence with substance-induced mood disorder. secondary to unknown stressors and acute intoxication.  Past Medical History:  Past Medical History  Diagnosis Date  . Anxiety   . Depression   . Hypertension   . Asthma   . Alcohol dependence     Past Surgical History  Procedure Laterality Date  . No past surgeries     Family  History:  Family History  Problem Relation Age of Onset  . OCD Mother   . Suicidality Neg Hx    Social History:  History  Alcohol Use  . Yes    Comment: varies how much     History  Drug Use No    Social History   Social History   . Marital Status: Single    Spouse Name: N/A  . Number of Children: N/A  . Years of Education: N/A   Social History Main Topics  . Smoking status: Former Smoker    Types: Cigarettes    Quit date: 01/31/1999  . Smokeless tobacco: Never Used  . Alcohol Use: Yes     Comment: varies how much  . Drug Use: No  . Sexual Activity: Yes    Birth Control/ Protection: Condom   Other Topics Concern  . None   Social History Narrative   Additional Social History:                          Allergies:  No Known Allergies  Labs:  Results for orders placed or performed during the hospital encounter of 08/22/14 (from the past 48 hour(s))  Urine rapid drug screen (hosp performed) (Not at [facility])     Status: None   Collection Time: 08/22/14  7:00 PM  Result Value Ref Range   Opiates NONE DETECTED NONE DETECTED   Cocaine NONE DETECTED NONE DETECTED   Benzodiazepines NONE DETECTED NONE DETECTED   Amphetamines NONE DETECTED NONE DETECTED   Tetrahydrocannabinol NONE DETECTED NONE DETECTED   Barbiturates NONE DETECTED NONE DETECTED    Comment:        DRUG SCREEN FOR MEDICAL PURPOSES ONLY.  IF CONFIRMATION IS NEEDED FOR ANY PURPOSE, NOTIFY LAB WITHIN 5 DAYS.        LOWEST DETECTABLE LIMITS FOR URINE DRUG SCREEN Drug Class       Cutoff (ng/mL) Amphetamine      1000 Barbiturate      200 Benzodiazepine   423 Tricyclics       536 Opiates          300 Cocaine          300 THC              50   Comprehensive metabolic panel     Status: Abnormal   Collection Time: 08/22/14  7:02 PM  Result Value Ref Range   Sodium 143 135 - 145 mmol/L   Potassium 3.7 3.5 - 5.1 mmol/L   Chloride 105 101 - 111 mmol/L   CO2 26 22 - 32 mmol/L   Glucose, Bld 103 (H) 65 - 99 mg/dL   BUN 10 6 - 20 mg/dL   Creatinine, Ser 0.86 0.61 - 1.24 mg/dL   Calcium 8.7 (L) 8.9 - 10.3 mg/dL   Total Protein 7.4 6.5 - 8.1 g/dL   Albumin 4.2 3.5 - 5.0 g/dL   AST 31 15 - 41 U/L   ALT 30 17 - 63 U/L   Alkaline  Phosphatase 80 38 - 126 U/L   Total Bilirubin 0.6 0.3 - 1.2 mg/dL   GFR calc non Af Amer >60 >60 mL/min   GFR calc Af Amer >60 >60 mL/min    Comment: (NOTE) The eGFR has been calculated using the CKD EPI equation. This calculation has not been validated in all clinical situations. eGFR's persistently <60 mL/min signify possible Chronic Kidney Disease.    Anion gap 12  5 - 15  Ethanol (ETOH)     Status: Abnormal   Collection Time: 08/22/14  7:02 PM  Result Value Ref Range   Alcohol, Ethyl (B) 446 (HH) <5 mg/dL    Comment:        LOWEST DETECTABLE LIMIT FOR SERUM ALCOHOL IS 5 mg/dL FOR MEDICAL PURPOSES ONLY CRITICAL RESULT CALLED TO, READ BACK BY AND VERIFIED WITH: T.DOSTER,RN AT 2001 ON 08/22/14 BY W.SHEA   Salicylate level     Status: None   Collection Time: 08/22/14  7:02 PM  Result Value Ref Range   Salicylate Lvl <1.9 2.8 - 30.0 mg/dL  Acetaminophen level     Status: Abnormal   Collection Time: 08/22/14  7:02 PM  Result Value Ref Range   Acetaminophen (Tylenol), Serum <10 (L) 10 - 30 ug/mL    Comment:        THERAPEUTIC CONCENTRATIONS VARY SIGNIFICANTLY. A RANGE OF 10-30 ug/mL MAY BE AN EFFECTIVE CONCENTRATION FOR MANY PATIENTS. HOWEVER, SOME ARE BEST TREATED AT CONCENTRATIONS OUTSIDE THIS RANGE. ACETAMINOPHEN CONCENTRATIONS >150 ug/mL AT 4 HOURS AFTER INGESTION AND >50 ug/mL AT 12 HOURS AFTER INGESTION ARE OFTEN ASSOCIATED WITH TOXIC REACTIONS.   CBC     Status: Abnormal   Collection Time: 08/22/14  7:02 PM  Result Value Ref Range   WBC 4.8 4.0 - 10.5 K/uL   RBC 4.41 4.22 - 5.81 MIL/uL   Hemoglobin 13.7 13.0 - 17.0 g/dL   HCT 41.1 39.0 - 52.0 %   MCV 93.2 78.0 - 100.0 fL   MCH 31.1 26.0 - 34.0 pg   MCHC 33.3 30.0 - 36.0 g/dL   RDW 14.2 11.5 - 15.5 %   Platelets 115 (L) 150 - 400 K/uL    Comment: RESULT REPEATED AND VERIFIED SPECIMEN CHECKED FOR CLOTS CONSISTENT WITH PREVIOUS RESULT     Vitals: Blood pressure 131/84, pulse 79, temperature 98.7 F (37.1  C), temperature source Oral, resp. rate 19, SpO2 100 %.  Risk to Self: Is patient at risk for suicide?: Yes Risk to Others:   Prior Inpatient Therapy:   Prior Outpatient Therapy:    Current Facility-Administered Medications  Medication Dose Route Frequency Provider Last Rate Last Dose  . acetaminophen (TYLENOL) tablet 650 mg  650 mg Oral Q4H PRN Daleen Bo, MD      . albuterol (PROVENTIL HFA;VENTOLIN HFA) 108 (90 BASE) MCG/ACT inhaler 1 puff  1 puff Inhalation Q6H PRN Varney Biles, MD   1 puff at 08/23/14 0642  . alum & mag hydroxide-simeth (MAALOX/MYLANTA) 200-200-20 MG/5ML suspension 30 mL  30 mL Oral PRN Daleen Bo, MD      . citalopram (CELEXA) tablet 40 mg  40 mg Oral Daily Daleen Bo, MD   40 mg at 08/23/14 1016  . divalproex (DEPAKOTE) DR tablet 500 mg  500 mg Oral BID Daleen Bo, MD   500 mg at 08/23/14 1016  . folic acid (FOLVITE) tablet 1 mg  1 mg Oral Daily Daleen Bo, MD   1 mg at 08/23/14 1016  . ibuprofen (ADVIL,MOTRIN) tablet 600 mg  600 mg Oral Q8H PRN Daleen Bo, MD      . LORazepam (ATIVAN) tablet 1 mg  1 mg Oral Q6H PRN Daleen Bo, MD   1 mg at 08/23/14 1208   Or  . LORazepam (ATIVAN) injection 1 mg  1 mg Intravenous Q6H PRN Daleen Bo, MD      . multivitamin with minerals tablet 1 tablet  1 tablet Oral Daily Daleen Bo, MD  1 tablet at 08/23/14 1016  . nicotine (NICODERM CQ - dosed in mg/24 hours) patch 21 mg  21 mg Transdermal Daily PRN Daleen Bo, MD      . ondansetron [facility]) tablet 4 mg  4 mg Oral Q8H PRN Daleen Bo, MD      . thiamine (VITAMIN B-1) tablet 100 mg  100 mg Oral Daily Daleen Bo, MD   100 mg at 08/23/14 1016   Or  . thiamine (B-1) injection 100 mg  100 mg Intravenous Daily Daleen Bo, MD      . traZODone (DESYREL) tablet 100 mg  100 mg Oral QHS PRN Daleen Bo, MD      . zolpidem (AMBIEN) tablet 5 mg  5 mg Oral QHS PRN Daleen Bo, MD       Current Outpatient Prescriptions  Medication Sig Dispense  Refill  . albuterol (PROVENTIL) (2.5 MG/3ML) 0.083% nebulizer solution Take 3 mLs (2.5 mg total) by nebulization once. (Patient taking differently: Take 2.5 mg by nebulization every 6 (six) hours as needed for wheezing or shortness of breath. ) 75 mL 12  . ALPRAZolam (XANAX) 0.5 MG tablet Take 0.25 mg by mouth 3 (three) times daily as needed for anxiety.    . citalopram (CELEXA) 40 MG tablet Take 1 tablet (40 mg total) by mouth daily. 30 tablet 1  . divalproex (DEPAKOTE) 500 MG DR tablet Take 1 tablet (500 mg total) by mouth 2 (two) times daily. 60 tablet 1  . folic acid (FOLVITE) 1 MG tablet Take 1 tablet (1 mg total) by mouth daily. 30 tablet 0  . hydrOXYzine (ATARAX/VISTARIL) 50 MG tablet Take 1 tablet (50 mg total) by mouth 3 (three) times daily as needed for anxiety. 90 tablet 1  . traZODone (DESYREL) 100 MG tablet Take 1 tablet (100 mg total) by mouth at bedtime as needed. For sleep 30 tablet 1  . labetalol (NORMODYNE) 100 MG tablet Take 1 tablet (100 mg total) by mouth 2 (two) times daily. (Patient not taking: Reported on 08/22/2014) 60 tablet 0  . Multiple Vitamin (MULTIVITAMIN WITH MINERALS) TABS tablet Take 1 tablet by mouth daily. (Patient not taking: Reported on 08/22/2014) 30 tablet 0  . thiamine 100 MG tablet Take 1 tablet (100 mg total) by mouth daily. (Patient not taking: Reported on 08/22/2014) 30 tablet 0    Musculoskeletal: Strength & Muscle Tone: within normal limits Gait & Station: normal Patient leans: N/A  Psychiatric Specialty Exam: Physical Exam  Review of Systems  Psychiatric/Behavioral: Positive for depression, suicidal ideas and substance abuse. Negative for hallucinations. The patient is nervous/anxious and has insomnia.   All other systems reviewed and are negative.   Blood pressure 131/84, pulse 79, temperature 98.7 F (37.1 C), temperature source Oral, resp. rate 19, SpO2 100 %.There is no weight on file to calculate BMI.  General Appearance: Casual and Fairly  Groomed  Eye Contact::  Good  Speech:  Clear and Coherent and Slow  Volume:  Normal  Mood:  Anxious and Depressed  Affect:  Appropriate, Congruent and Depressed  Thought Process:  Circumstantial and Coherent  Orientation:  Full (Time, Place, and Person)  Thought Content:  Rumination  Suicidal Thoughts:  Yes.  with intent/plan  Homicidal Thoughts:  No  Memory:  Immediate;   Fair Recent;   Fair Remote;   Fair  Judgement:  Fair  Insight:  Fair  Psychomotor Activity:  Normal  Concentration:  Fair  Recall:  Roel Cluck of Knowledge:Good  Language: Fair  Akathisia:  No  Handed:    AIMS (if indicated):     Assets:  Communication Skills Desire for Improvement Resilience Social Support  ADL's:  Intact  Cognition: WNL  Sleep:      Medical Decision Making: Review of Psycho-Social Stressors (1), Review or order clinical lab tests (1), Established Problem, Worsening (2), Review of Medication Regimen & Side Effects (2) and Review of New Medication or Change in Dosage (2)  Treatment Plan Summary: Alcohol use disorder, severe, dependence, unstable, managed as below Daily contact with patient to assess and evaluate symptoms and progress in treatment and Medication management  Plan:  Recommend psychiatric Inpatient admission when medically cleared.  Disposition:  -Inpatient psychiatric hospitalization for safety and stabilization including detox from alcohol withdrawal.  -Pt accepted at [facility], FNP-BC 08/23/2014 12:41 PM   Patient seen and I agree with treatment and plan  Griffin Dakin.D.

## 2014-08-23 NOTE — Progress Notes (Signed)
Patient did attend the evening speaker AA meeting.  

## 2014-08-23 NOTE — Progress Notes (Signed)
Patient ID: Ian [name], male   DOB: [birth year], 34 y.o.   MRN: [id number] D-appears flat, depressed and anxious. Tremors in hands. 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 Support and encouragement provided. level 3 checks continued.  R- receptive, safety maintained

## 2014-08-23 NOTE — ED Notes (Signed)
Pt ambulatory w/o difficulty to [facility] w/ [facility], belongings given to driver. 

## 2014-08-23 NOTE — ED Notes (Signed)
TTS into see 

## 2014-08-23 NOTE — ED Notes (Signed)
[facility] contacted for transport 

## 2014-08-23 NOTE — Tx Team (Signed)
Initial Interdisciplinary Treatment Plan   PATIENT STRESSORS: Financial difficulties Health problems Substance abuse   PATIENT STRENGTHS: Ability for insight Communication skills Work skills   PROBLEM LIST: Problem List/Patient Goals Date to be addressed Date deferred Reason deferred Estimated date of resolution  Pt stated," my bills keep piling up."      " I have been drinking for 13 years."      "I need help to stop drinking. "                                           DISCHARGE CRITERIA:  Ability to meet basic life and health needs Medical problems require only outpatient monitoring Safe-care adequate arrangements made  PRELIMINARY DISCHARGE PLAN: Attend aftercare/continuing care group Outpatient therapy Return to previous work or school arrangements  PATIENT/FAMIILY INVOLVEMENT: This treatment plan has been presented to and reviewed with the patient, Ian [name], and/or family member  The patient has been given the opportunity to ask questions and make suggestions.  Rodman Key [facility] 08/23/2014, 6:04 PM

## 2014-08-24 ENCOUNTER — Encounter (HOSPITAL_COMMUNITY): Payer: Self-pay | Admitting: Psychiatry

## 2014-08-24 DIAGNOSIS — F1020 Alcohol dependence, uncomplicated: Principal | ICD-10-CM

## 2014-08-24 DIAGNOSIS — F411 Generalized anxiety disorder: Secondary | ICD-10-CM

## 2014-08-24 DIAGNOSIS — F331 Major depressive disorder, recurrent, moderate: Secondary | ICD-10-CM

## 2014-08-24 LAB — LIPASE, BLOOD: LIPASE: 37 U/L (ref 22–51)

## 2014-08-24 MED ORDER — ACAMPROSATE CALCIUM 333 MG PO TBEC
666.0000 mg | DELAYED_RELEASE_TABLET | Freq: Three times a day (TID) | ORAL | Status: DC
  Administered 2014-08-24 – 2014-08-26 (×6): 666 mg via ORAL
  Filled 2014-08-24 (×4): qty 2
  Filled 2014-08-24 (×2): qty 18
  Filled 2014-08-24: qty 2
  Filled 2014-08-24: qty 18
  Filled 2014-08-24 (×4): qty 2

## 2014-08-24 MED ORDER — LISINOPRIL 5 MG PO TABS
5.0000 mg | ORAL_TABLET | Freq: Every day | ORAL | Status: DC
  Administered 2014-08-24 – 2014-08-26 (×3): 5 mg via ORAL
  Filled 2014-08-24 (×4): qty 1
  Filled 2014-08-24: qty 3
  Filled 2014-08-24: qty 1

## 2014-08-24 NOTE — [facility] Group Notes (Signed)
Adult Psychoeducational Group Note  Date:  08/24/2014 Time:  9:30 PM  Group Topic/Focus:  AA Meeting  Participation Level:  Active  Participation Quality:  Appropriate  Affect:  Appropriate  Cognitive:  Appropriate  Insight: Appropriate  Engagement in Group:  Engaged  Modes of Intervention:  Discussion  Additional Comments:  Patient attended and shared with the group.  Caroll Rancher A 08/24/2014, 9:30 PM

## 2014-08-24 NOTE — [facility] Suicide Risk Assessment (Signed)
[facility] Admission Suicide Risk Assessment   Nursing information obtained from:  Patient Demographic factors:  Male, Caucasian Current Mental Status:    Loss Factors:  Financial problems / change in socioeconomic status Historical Factors:    Risk Reduction Factors:  Responsible for children under 33 years of age, Sense of responsibility to family, Religious beliefs about death, Employed, Living with another person, especially a relative Total Time spent with patient: 45 minutes Principal Problem: Alcohol use disorder, severe, dependence Diagnosis:   Patient Active Problem List   Diagnosis Date Noted  . Alcohol use disorder, severe, dependence [F10.20] 08/23/2014  . Substance induced mood disorder [F19.94] 08/23/2014  . Suicidal ideation [R45.851] 08/23/2014  . Pancreatitis [K85.9] 01/30/2014  . Acute pancreatitis [K85.9] 01/30/2014  . Abdominal pain [R10.9] 01/30/2014  . Nausea and vomiting [R11.2] 01/30/2014  . Hyponatremia [E87.1] 01/30/2014  . Leukocytosis [D72.829]   . Nicotine abuse [F19.10] 08/14/2013  . GAD (generalized anxiety disorder) [F41.1] 08/14/2013  . Major depressive disorder, recurrent episode, moderate [F33.1] 08/14/2013  . ABDOMINAL PAIN OTHER SPECIFIED SITE [R10.84] 08/05/2009  . CHEST PAIN [R07.9] 01/19/2009  . CERUMEN IMPACTION [H61.20] 07/31/2008  . ASTHMA [J45.909] 07/31/2008     Continued Clinical Symptoms:  Alcohol Use Disorder Identification Test Final Score (AUDIT): 33 The "Alcohol Use Disorders Identification Test", Guidelines for Use in Primary Care, Second Edition.  World Science writer [facility]). Score between 0-7:  no or low risk or alcohol related problems. Score between 8-15:  moderate risk of alcohol related problems. Score between 16-19:  high risk of alcohol related problems. Score 20 or above:  warrants further diagnostic evaluation for alcohol dependence and treatment.   CLINICAL FACTORS:   Depression:   Comorbid alcohol  abuse/dependence Alcohol/Substance Abuse/Dependencies   Psychiatric Specialty Exam: Physical Exam  ROS  Blood pressure 132/100, pulse 71, temperature 97.8 F (36.6 C), temperature source Oral, resp. rate 16, height 5' 11.5" (1.816 m), weight 96.163 kg (212 lb).Body mass index is 29.16 kg/(m^2).    COGNITIVE FEATURES THAT CONTRIBUTE TO RISK:  Closed-mindedness, Polarized thinking and Thought constriction (tunnel vision)    SUICIDE RISK:   Mild:  Suicidal ideation of limited frequency, intensity, duration, and specificity.  There are no identifiable plans, no associated intent, mild dysphoria and related symptoms, good self-control (both objective and subjective assessment), few other risk factors, and identifiable protective factors, including available and accessible social support.  PLAN OF CARE: Supportive approach/coping skills                              Alcohol dependence; [facility] detox protocol                              Reassess and address the co morbidities                              Explore residential treatment vs. CDIOP  Medical Decision Making:  Review of Psycho-Social Stressors (1), Review or order clinical lab tests (1), Review of Medication Regimen & Side Effects (2) and Review of New Medication or Change in Dosage (2)  I certify that inpatient services furnished can reasonably be expected to improve the patient's condition.   Oswell Say A 08/24/2014, 4:54 PM

## 2014-08-24 NOTE — [facility] Group Notes (Signed)
[facility] LCSW Aftercare Discharge Planning Group Note   08/24/2014 10:17 AM  Participation Quality:  Appropriate   Mood/Affect:  Appropriate  Depression Rating:  8-9  Anxiety Rating:  10  Thoughts of Suicide:  No Will you contract for safety?   NA  Current AVH:  No  Plan for Discharge/Comments:  Pt reports that he wants to be seen by o/p provider other than [facility]. CSW assessing. Pt reports hot/cold flashes/moderate withdrawal sx this morning. Poor sleep/cravings.   Transportation Means: family   Supports: parents   Smart, Oncologist

## 2014-08-24 NOTE — [facility] Counselor (Signed)
Adult Comprehensive Assessment  Patient ID: Ian [name], male   DOB: [birth year], 34 y.o.   MRN: [id number]  Information Source: Information source: Patient  Current Stressors:  Educational / Learning stressors: NA Employment / Job issues: starting new job last [employer] worker.  Family Relationships: Strained at best due to pt's drinking Financial / Lack of resources (include bankruptcy): limited income.pays out of pocket for [employer]. Housing / Lack of housing:: lives in apt in [place] with roommate.  Physical health (include injuries & life threatening diseases): HTN, Anxiety and Depression Social relationships: Isolates Substance abuse: relapse recently. Up to 1/2 gallon liquor daily since Wed of last week. No drug use. Bereavement / Loss: n/a   Living/Environment/Situation:  Living Arrangements: roommate. Apt in [place] Living conditions (as described by patient or guardian): fair; lease expires at end of Aug-may renew.  How long has patient lived in current situation?: 11 months What is atmosphere in current home: Other (Comment) safe; clean.   Family History:  Marital status: Single Does patient have children?: Yes How many children?: 1  How is patient's relationship with their children?: Patient reports good relationship w 59 YO son. Partial custody.   Childhood History:  By whom was/is the patient raised?: Both parents Additional childhood history information: Parents divorced at pt's age 88 Description of patient's relationship with caregiver when they were a child: "fine" Patient's description of current relationship with people who raised him/her: Srained with mother, [name] and step [name] Does patient have siblings?: Yes Number of Siblings: 1  Description of patient's current relationship with siblings: "Not great with older sister" Did patient suffer any verbal/emotional/physical/sexual abuse as a child?: No Did patient suffer  from severe childhood neglect?: No Has patient ever been sexually abused/assaulted/raped as an adolescent or adult?: No Was the patient ever a victim of a crime or a disaster?: No Witnessed domestic violence?: No Has patient been effected by domestic violence as an adult?: No  Education:  Highest grade of school patient has completed: 16 Currently a student?: No Contact person: Ian [name] [phone] Learning disability?: No  Employment/Work Situation:  Employment situation: has been working for the past week as a Surveyor, minerals.  Patient's job has been impacted by current illness: Yes-missing work due to being hospitalized.  What is the longest time patient has a held a job?: 2 1/2 years  Where was the patient employed at that time?: Engineer, petroleum at [employer].  Has patient ever been in the [employer]?: No Has patient ever served in combat?: No  Financial Resources:  Financial resources: limited income from work; pays for [employer] out of pocket.   Alcohol/Substance Abuse:  What has been your use of drugs/alcohol within the last 12 months?: recent relapse. Up to half gallon liquor daily. No other drug use or tobacco use per pt.  If attempted suicide, did drugs/alcohol play a role in this?: (No attempt) Alcohol/Substance Abuse Treatment Hx: Past Tx, Inpatient If yes, describe treatment: August 2011[facility], Jan 2012 [facility] and 4/13 [facility]. [facility] for detox.  Has alcohol/substance abuse ever caused legal problems?: Yes-DUI's. No recent court dates.   Social Support System:  Patient's Community Support System: [employer] Museum/gallery exhibitions officer System: Family-"My parents are pissed at me and feel like I'm never [place] get myself together."  Type of faith/religion: Grew up Christain How does patient's faith help to cope with current illness?: "very involved in high school; but have slipped away"  Leisure/Recreation:   Leisure  and Hobbies: spending time with my son.   Strengths/Needs:  What things does the patient do well?: hard worker. Try to be a good parent.  In what areas does patient struggle / problems for patient: cravings; coping skills, anxiety.   Discharge Plan:  Does patient have access to transportation?:drives and has car.  Will patient be returning to same living situation after discharge?:home to apt.  Currently receiving community mental health services: [facility] but wants referral to another o/p provider now that he has insurance. [facility].  Does patient have financial barriers related to discharge medications?: no.   Summary/Recommendations:  Pt is 34 YO single caucasian male admitted with diagnosis of Alcohol Abuse and Major Depression. Patient reports that he recently relapsed on alcohol and is drinking up to half a gallon of liquor daily. 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 He reports no identifiable triggers for relapse. Pt has been compliant with meds until he ran out last month. No SI/HI/AVH reported currently, but pt admits passive SI upon admission. Recommendations for pt include: crisis stabilization, medication evaluation, librium taper for withdrawals, group therapy and psychoeducation in addition to case management for discharge planning. Pt has [employer] and is interested in going to either [facility] or [facility] o/p for med management and therapy. CSW assessing. Pt refusing referral to [facility] "Bad experience in the past."     Smart, Lebron Quam 08/24/2014

## 2014-08-24 NOTE — Tx Team (Signed)
Interdisciplinary Treatment Plan Update (Adult)  Date:  08/24/2014  Time Reviewed:  8:11 AM   Progress in Treatment: Attending groups: Yes. Participating in groups:  Yes. Taking medication as prescribed:  Yes. Tolerating medication:  Yes. Family/Significant othe contact made:  Not yet. SPE required for this pt.  Patient understands diagnosis:  Yes. and As evidenced by:  seeking treatment for ETOH abuse/detox, depression, and med stabilization. Discussing patient identified problems/goals with staff:  Yes. Medical problems stabilized or resolved:  Yes. Denies suicidal/homicidal ideation: Yes. Issues/concerns per patient self-inventory:   Discharge Plan or Barriers: Pt reports that he wants to be seen by either [facility] counseling or [facility], rather than [facility]. He plans to return home at d/c. [facility] info also provided to pt. CSW assessing for appropriate referrals.   Reason for Continuation of Hospitalization: Depression Medication stabilization Withdrawal symptoms  Comments:  Ian [name] is an 34 y.o. male who presents to [facility] as a Walk In with the presenting problem of suicidal ideations and excessive alcohol consumption. Patient was observed to be under the influence of alcohol during the assessment AEB slurred speech and limited eye contact. He stated he is currently having suicidal thoughts due to feeling as if he is failure in life. He reports that his alcohol use has strained his relationship with others and that his parents see him as a disappointment. Patient reports that he has struggled with depression since the age of 34 years old and now currently rates his depression as a 8 (on a scale from 1-10 with 10 representing extreme depression). He states that he has not attempted suicide in the past but has received inpatient treatment for his alcoholism at [facility] in 2011. Patient reports depressive symptoms that include:  insomnia, guilt, feelings of angry/irritability, and fatigue. He shares that he consumes alcohol daily and has weeks where he binges on a variety of different amounts. Patient reports no current outpatient providers at this time but states that he currently is taking Citalopram, Depakote, and Folic Acid. Pt reports dx of Major Depression, Recurrent severe and Alcohol Use Disorder.  Estimated length of stay:  3-5 days   New goal(s): to formulate aftercare plan.   Additional Comments:  Patient and CSW reviewed pt's identified goals and treatment plan. Patient verbalized understanding and agreed to treatment plan. CSW reviewed [facility] "Discharge Process and Patient Involvement" Form. Pt verbalized understanding of information provided and signed form.    Review of initial/current patient goals per problem list:  1. Goal(s): Patient will participate in aftercare plan  Met: No  Target date: at discharge  As evidenced by: Patient will participate within aftercare plan AEB aftercare provider and housing plan at discharge being identified.  8/15: Pt requesting referral to [facility] or [facility]. CSW assessing.   2. Goal (s): Patient will exhibit decreased depressive symptoms and suicidal ideations.  Met: No.    Target date: at discharge  As evidenced by: Patient will utilize self rating of depression at 3 or below and demonstrate decreased signs of depression or be deemed stable for discharge by MD.  8/15: Pt rates depression as 9/10 and presents with depressed mood/anxious affect. Denies Si/Hi/AVH.   3. Goal(s): Patient will demonstrate decreased signs and symptoms of anxiety.  Met:No.   Target date: at discharge  As evidenced by: Patient will utilize self rating of anxiety at 3 or below and demonstrated decreased signs of anxiety, or be deemed stable for discharge  by MD  8/15: Pt rates anxiety as 10/10 and presents with depressed mood/anxious affect.   4. Goal(s): Patient  will demonstrate decreased signs of withdrawal due to substance abuse  Met:  Target date:at discharge   As evidenced by: Patient will produce a CIWA/COWS score of 0, have stable vitals signs, and no symptoms of withdrawal.  8/15: Pt reports mild-moderate withdrawals with CIWA score of 6 and high sitting/standing BP.    Attendees: Patient:   08/24/2014 8:11 AM   Family:   08/24/2014 8:11 AM   Physician:  Dr. Carlton Adam, MD 08/24/2014 8:11 AM   Nursing:   Shelby Mattocks, Jan RN 08/24/2014 8:11 AM   Clinical Social Worker: Maxie Better, LCSWA  08/24/2014 8:11 AM   Clinical Social Worker: Erasmo Downer Drinkard LCSWA; Peri Maris LCSWA 08/24/2014 8:11 AM   Other:  Gerline Legacy Nurse Case Manager 08/24/2014 8:11 AM   Other:  Lucinda Dell; [facility]  08/24/2014 8:11 AM   Other:   08/24/2014 8:11 AM   Other:  08/24/2014 8:11 AM   Other:  08/24/2014 8:11 AM   Other:  08/24/2014 8:11 AM    08/24/2014 8:11 AM    08/24/2014 8:11 AM    08/24/2014 8:11 AM    08/24/2014 8:11 AM    Scribe for Treatment Team:   Maxie Better, [place]  08/24/2014 8:11 AM

## 2014-08-24 NOTE — Clinical Social Work Note (Signed)
Referral for o/p med management and therapy faxed to [facility] per pt request.  Trula Slade, LCSWA Clinical Social Worker 08/24/2014 1:17 PM

## 2014-08-24 NOTE — [facility] Group Notes (Signed)
[facility] LCSW Group Therapy  08/24/2014 1:12 PM  Type of Therapy:  Group Therapy  Participation Level:  Active  Participation Quality:  Attentive  Affect:  Appropriate  Cognitive:  Alert, Appropriate and Oriented  Insight:  Improving  Engagement in Therapy:  Improving  Modes of Intervention:  Confrontation, Discussion, Education, Exploration, Problem-solving, Rapport Building, Socialization and Support  Summary of Progress/Problems: Today's Topic: Overcoming Obstacles. Patients identified one short term goal and potential obstacles in reaching this goal. Patients processed barriers involved in overcoming these obstacles. Patients identified steps necessary for overcoming these obstacles and explored motivation (internal and external) for facing these difficulties head on. Ian [name] was attentive and engaged during today's processing group. He shared that he main goal is "to get medication management and get reid of the shame and guilt and go to AA." He shred that some old AA friends came last night to AA meeting in the hospital and inspired him to rejoin the AA group. He also talked about finding a provider for med management other than [facility] like [facility] or [facility] in order to get better quality of care. "I need to stick to my appts and stay serious about my recovery-that is my biggest obstacle."   Smart, Ian [name] LCSWA  08/24/2014, 1:12 PM

## 2014-08-24 NOTE — Progress Notes (Signed)
D:Pt rates his depression and anxiety as a 9 and hopelessness as a 10 on 1-10 scale with 10 being the most. Pt c/o feeling hot/cold with detox symptoms and a headache. He is out in the [place] sitting in the dayroom.  A:Offered support, encouragement and 15 minute checks. Gave prn medication for headache. R:Pt denies si and hi. Safety maintained on the unit.

## 2014-08-24 NOTE — Progress Notes (Signed)
Recreation Therapy Notes  Date: 08.15.2016 Time: 9:30am  Location: [address] Group Room   Group Topic: Stress Management  Goal Area(s) Addresses:  Patient will actively participate in stress management techniques presented during session.   Behavioral Response: Did not attend.   Marykay Lex Alessandria Henken, LRT/CTRS        Jaidin Richison L 08/24/2014 3:05 PM

## 2014-08-24 NOTE — H&P (Signed)
Psychiatric Admission Assessment Adult  Patient Identification: Ian [name] MRN:  [id number] Date of Evaluation:  08/24/2014 Chief Complaint:  Alcohol Use Disorder, Severe, Dependence Principal Diagnosis: <principal problem not specified> Diagnosis:   Patient Active Problem List   Diagnosis Date Noted  . Alcohol use disorder, severe, dependence [F10.20] 08/23/2014  . Substance induced mood disorder [F19.94] 08/23/2014  . Suicidal ideation [R45.851] 08/23/2014  . Pancreatitis [K85.9] 01/30/2014  . Acute pancreatitis [K85.9] 01/30/2014  . Abdominal pain [R10.9] 01/30/2014  . Nausea and vomiting [R11.2] 01/30/2014  . Hyponatremia [E87.1] 01/30/2014  . Alcohol abuse [F10.10] 01/30/2014  . Leukocytosis [D72.829]   . Nicotine abuse [F19.10] 08/14/2013  . GAD (generalized anxiety disorder) [F41.1] 08/14/2013  . Alcohol dependence in remission [F10.21] 08/14/2013  . Major depressive disorder, recurrent episode, moderate [F33.1] 08/14/2013  . Alcohol dependence [F10.20] 07/04/2011    Class: Acute  . ABDOMINAL PAIN OTHER SPECIFIED SITE [R10.84] 08/05/2009  . CHEST PAIN [R07.9] 01/19/2009  . ANXIETY STATE, UNSPECIFIED [F41.1] 07/31/2008  . CERUMEN IMPACTION [H61.20] 07/31/2008  . ASTHMA [J45.909] 07/31/2008   History of Present Illness:: 34 Y/O male who states he suffers from anxiety and depression so he drinks. Then feels guilt and drinks some more. Has his son every other week. States during regular school year he has him every other weekend. His son is 71. States he has had issues with drinking and driving while his was in the car. He has an alcohol monitor that he has to wear. States it has gotten to a point he cant continue to do this anymore. It is not only affecting him but his son also. He is afraid his mother is going to build a case against him so he cant see him.  The initial assessment was as follows: Ian [name] is an 34 y.o. male who presents to [facility] as a Walk In with the presenting problem of suicidal ideations and excessive alcohol consumption. Patient was observed to be under the influence of alcohol during the assessment AEB slurred speech and limited eye contact. He stated he is currently having suicidal thoughts due to feeling as if he is failure in life. He reports that his alcohol use has strained his relationship with others and that his parents see him as a disappointment. Patient reports that he has struggled with depression since the age of 34 years old and now currently rates his depression as a 8 (on a scale from 1-10 with 10 representing extreme depression). He states that he has not attempted suicide in the past but has received inpatient treatment for his alcoholism at [facility] in 2011. Patient reports depressive symptoms that include: insomnia, guilt, feelings of angry/irritability, and fatigue. He shares that he consumes alcohol daily and has weeks where he binges on a variety of different amounts. Patient reports no current outpatient providers at this time but states that he currently is taking Citalopram, Depakote, and Folic Acid. Clinician consulted with Agustina Caroli, NP who recommends for patient to be transported to [facility] for medical clearance and further evaluation by psychiatry.   Elements:  Location:  anxiety depression alcohol dependence. Quality:  unable to function experiences increased depression and anxiety what leds him to drink more the anxiety . Severity:  severe. Timing:  every day. Duration:  building up but worst last few months. Context:  depression and anxiety what he says triggers his drinking the alcohol makes him feel worst guilty for what he drinks more, unable to brake  the cycle on his own. Associated Signs/Symptoms: Depression Symptoms:  depressed mood, anhedonia, insomnia, fatigue, difficulty concentrating, anxiety, loss of energy/fatigue, disturbed sleep, weight loss, (Hypo)  Manic Symptoms:  Irritable Mood, Labiality of Mood, Anxiety Symptoms:  Excessive Worry, Psychotic Symptoms:  denies PTSD Symptoms: Negative Total Time spent with patient: 45 minutes  Past Medical History:  Past Medical History  Diagnosis Date  . Anxiety   . Depression   . Hypertension   . Asthma   . Alcohol dependence     Past Surgical History  Procedure Laterality Date  . No past surgeries     Family History:  Family History  Problem Relation Age of Onset  . OCD Mother   . Suicidality Neg Hx   Anxiety, OCD, on his mother.  Social History:  History  Alcohol Use  . Yes    Comment: varies how much     History  Drug Use No    Social History   Social History  . Marital Status: Single    Spouse Name: N/A  . Number of Children: N/A  . Years of Education: N/A   Social History Main Topics  . Smoking status: Former Smoker    Types: Cigarettes    Quit date: 01/31/1999  . Smokeless tobacco: Never Used  . Alcohol Use: Yes     Comment: varies how much  . Drug Use: No  . Sexual Activity: Yes    Birth Control/ Protection: Condom   Other Topics Concern  . 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 Has had 10 jobs in the last 2 years. Has one son 56 Y/O, states he was not ready to [place]. BA in Therapist, occupational and business Additional Social History:                          Musculoskeletal: Strength & Muscle Tone: within normal limits Gait & Station: normal Patient leans: normal  Psychiatric Specialty Exam: Physical Exam  Review of Systems  Constitutional: Positive for weight loss and malaise/fatigue.  HENT:       Tension  Eyes: Positive for blurred vision.  Respiratory: Positive for cough.   Cardiovascular: Negative.   Gastrointestinal: Positive for heartburn, nausea and diarrhea.  Genitourinary: Negative.   Musculoskeletal: Negative.   Skin: Positive for itching.  Neurological: Positive for  weakness and headaches.  Endo/Heme/Allergies: Negative.   Psychiatric/Behavioral: Positive for depression and substance abuse. The patient is nervous/anxious and has insomnia.     Blood pressure 132/100, pulse 71, temperature 97.8 F (36.6 C), temperature source Oral, resp. rate 16, height 5' 11.5" (1.816 m), weight 96.163 kg (212 lb).Body mass index is 29.16 kg/(m^2).  General Appearance: Fairly Groomed  Engineer, water::  Fair  Speech:  Clear and Coherent  Volume:  Decreased  Mood:  Anxious and Depressed  Affect:  Restricted  Thought Process:  Coherent and Goal Directed  Orientation:  Full (Time, Place, and Person)  Thought Content:  symptoms events worries concerns  Suicidal Thoughts:  No  Homicidal Thoughts:  No  Memory:  Immediate;   Fair Recent;   Fair Remote;   Fair  Judgement:  Fair  Insight:  Present  Psychomotor Activity:  Restlessness  Concentration:  Fair  Recall:  [employer] of Knowledge:Fair  Language: Fair  Akathisia:  No  Handed:  Right  AIMS (if indicated):     Assets:  Desire for [place]  Support  ADL's:  Intact  Cognition: WNL  Sleep:  Number of Hours: 4.5   Risk to Self: Is patient at risk for suicide?: No Risk to Others:   Prior Inpatient Therapy:  [facility] [address] [facility] [address] Prior Outpatient Therapy:  [facility] in [place] last time 3 months ago  Alcohol Screening: 1. How often do you have a drink containing alcohol?: 4 or more times a week 2. How many drinks containing alcohol do you have on a typical day when you are drinking?: 10 or more 3. How often do you have six or more drinks on one occasion?: Daily or almost daily Preliminary Score: 8 4. How often during the last year have you found that you were not able to stop drinking once you had started?: Daily or almost daily 5. How often during the last year have you failed to do what was normally expected from you becasue of drinking?: Less than monthly 6. How  often during the last year have you needed a first drink in the morning to get yourself going after a heavy drinking session?: Daily or almost daily 7. How often during the last year have you had a feeling of guilt of remorse after drinking?: Daily or almost daily 8. How often during the last year have you been unable to remember what happened the night before because you had been drinking?: Daily or almost daily 9. Have you or someone else been injured as a result of your drinking?: No 10. Has a relative or friend or a doctor or another health worker been concerned about your drinking or suggested you cut down?: Yes, during the last year Alcohol Use Disorder Identification Test Final Score (AUDIT): 33 Brief Intervention: Yes (informed pt that he has other coping skills and he can go to [facility].)  Allergies:  No Known Allergies Lab Results:  Results for orders placed or performed during the hospital encounter of 08/22/14 (from the past 48 hour(s))  Urine rapid drug screen (hosp performed) (Not at [facility])     Status: None   Collection Time: 08/22/14  7:00 PM  Result Value Ref Range   Opiates NONE DETECTED NONE DETECTED   Cocaine NONE DETECTED NONE DETECTED   Benzodiazepines NONE DETECTED NONE DETECTED   Amphetamines NONE DETECTED NONE DETECTED   Tetrahydrocannabinol NONE DETECTED NONE DETECTED   Barbiturates NONE DETECTED NONE DETECTED    Comment:        DRUG SCREEN FOR MEDICAL PURPOSES ONLY.  IF CONFIRMATION IS NEEDED FOR ANY PURPOSE, NOTIFY LAB WITHIN 5 DAYS.        LOWEST DETECTABLE LIMITS FOR URINE DRUG SCREEN Drug Class       Cutoff (ng/mL) Amphetamine      1000 Barbiturate      200 Benzodiazepine   545 Tricyclics       625 Opiates          300 Cocaine          300 THC              50   Comprehensive metabolic panel     Status: Abnormal   Collection Time: 08/22/14  7:02 PM  Result Value Ref Range   Sodium 143 135 - 145 mmol/L   Potassium 3.7 3.5 - 5.1 mmol/L   Chloride 105 101  - 111 mmol/L   CO2 26 22 - 32 mmol/L   Glucose, Bld 103 (H) 65 - 99 mg/dL   BUN 10 6 - 20  mg/dL   Creatinine, Ser 0.86 0.61 - 1.24 mg/dL   Calcium 8.7 (L) 8.9 - 10.3 mg/dL   Total Protein 7.4 6.5 - 8.1 g/dL   Albumin 4.2 3.5 - 5.0 g/dL   AST 31 15 - 41 U/L   ALT 30 17 - 63 U/L   Alkaline Phosphatase 80 38 - 126 U/L   Total Bilirubin 0.6 0.3 - 1.2 mg/dL   GFR calc non Af Amer >60 >60 mL/min   GFR calc Af Amer >60 >60 mL/min    Comment: (NOTE) The eGFR has been calculated using the CKD EPI equation. This calculation has not been validated in all clinical situations. eGFR's persistently <60 mL/min signify possible Chronic Kidney Disease.    Anion gap 12 5 - 15  Ethanol (ETOH)     Status: Abnormal   Collection Time: 08/22/14  7:02 PM  Result Value Ref Range   Alcohol, Ethyl (B) 446 (HH) <5 mg/dL    Comment:        LOWEST DETECTABLE LIMIT FOR SERUM ALCOHOL IS 5 mg/dL FOR MEDICAL PURPOSES ONLY CRITICAL RESULT CALLED TO, READ BACK BY AND VERIFIED WITH: T.DOSTER,RN AT 2001 ON 08/22/14 BY W.SHEA   Salicylate level     Status: None   Collection Time: 08/22/14  7:02 PM  Result Value Ref Range   Salicylate Lvl <1.6 2.8 - 30.0 mg/dL  Acetaminophen level     Status: Abnormal   Collection Time: 08/22/14  7:02 PM  Result Value Ref Range   Acetaminophen (Tylenol), Serum <10 (L) 10 - 30 ug/mL    Comment:        THERAPEUTIC CONCENTRATIONS VARY SIGNIFICANTLY. A RANGE OF 10-30 ug/mL MAY BE AN EFFECTIVE CONCENTRATION FOR MANY PATIENTS. HOWEVER, SOME ARE BEST TREATED AT CONCENTRATIONS OUTSIDE THIS RANGE. ACETAMINOPHEN CONCENTRATIONS >150 ug/mL AT 4 HOURS AFTER INGESTION AND >50 ug/mL AT 12 HOURS AFTER INGESTION ARE OFTEN ASSOCIATED WITH TOXIC REACTIONS.   CBC     Status: Abnormal   Collection Time: 08/22/14  7:02 PM  Result Value Ref Range   WBC 4.8 4.0 - 10.5 K/uL   RBC 4.41 4.22 - 5.81 MIL/uL   Hemoglobin 13.7 13.0 - 17.0 g/dL   HCT 41.1 39.0 - 52.0 %   MCV 93.2 78.0 - 100.0  fL   MCH 31.1 26.0 - 34.0 pg   MCHC 33.3 30.0 - 36.0 g/dL   RDW 14.2 11.5 - 15.5 %   Platelets 115 (L) 150 - 400 K/uL    Comment: RESULT REPEATED AND VERIFIED SPECIMEN CHECKED FOR CLOTS CONSISTENT WITH PREVIOUS RESULT    Current Medications: Current Facility-Administered Medications  Medication Dose Route Frequency Provider Last Rate Last Dose  . acetaminophen (TYLENOL) tablet 650 mg  650 mg Oral Q6H PRN Niel Hummer, NP   650 mg at 08/24/14 0831  . albuterol (PROVENTIL HFA;VENTOLIN HFA) 108 (90 BASE) MCG/ACT inhaler 2 puff  2 puff Inhalation Q4H PRN Niel Hummer, NP   2 puff at 08/23/14 1822  . alum & mag hydroxide-simeth (MAALOX/MYLANTA) 200-200-20 MG/5ML suspension 30 mL  30 mL Oral Q4H PRN Niel Hummer, NP      . chlordiazePOXIDE (LIBRIUM) capsule 25 mg  25 mg Oral Q6H PRN Niel Hummer, NP   25 mg at 08/24/14 0555  . chlordiazePOXIDE (LIBRIUM) capsule 25 mg  25 mg Oral QID Niel Hummer, NP   25 mg at 08/24/14 1205   Followed by  . [START ON 08/25/2014] chlordiazePOXIDE (LIBRIUM) capsule 25  mg  25 mg Oral TID Niel Hummer, NP       Followed by  . [START ON 08/26/2014] chlordiazePOXIDE (LIBRIUM) capsule 25 mg  25 mg Oral BH-qamhs Niel Hummer, NP       Followed by  . [START ON 08/27/2014] chlordiazePOXIDE (LIBRIUM) capsule 25 mg  25 mg Oral Daily Niel Hummer, NP      . hydrOXYzine (ATARAX/VISTARIL) tablet 25 mg  25 mg Oral Q6H PRN Niel Hummer, NP   25 mg at 08/24/14 1111  . loperamide (IMODIUM) capsule 2-4 mg  2-4 mg Oral PRN Niel Hummer, NP      . magnesium hydroxide (MILK OF MAGNESIA) suspension 30 mL  30 mL Oral Daily PRN Niel Hummer, NP      . multivitamin with minerals tablet 1 tablet  1 tablet Oral Daily Niel Hummer, NP   1 tablet at 08/24/14 0830  . ondansetron (ZOFRAN-ODT) disintegrating tablet 4 mg  4 mg Oral Q6H PRN Niel Hummer, NP      . thiamine (B-1) injection 100 mg  100 mg Intramuscular Once Niel Hummer, NP   100 mg at 08/23/14 1821  . thiamine  (VITAMIN B-1) tablet 100 mg  100 mg Oral Daily Niel Hummer, NP   100 mg at 08/24/14 0830  . traZODone (DESYREL) tablet 100 mg  100 mg Oral QHS PRN Niel Hummer, NP   100 mg at 08/23/14 2143   PTA Medications: Prescriptions prior to admission  Medication Sig Dispense Refill Last Dose  . ALPRAZolam (XANAX) 0.5 MG tablet Take 0.25 mg by mouth 3 (three) times daily as needed for anxiety.   Unknown at Unknown time  . citalopram (CELEXA) 40 MG tablet Take 1 tablet (40 mg total) by mouth daily. 30 tablet 1 Unknown at Unknown time  . divalproex (DEPAKOTE) 500 MG DR tablet Take 1 tablet (500 mg total) by mouth 2 (two) times daily. 60 tablet 1 Unknown at Unknown time  . folic acid (FOLVITE) 1 MG tablet Take 1 tablet (1 mg total) by mouth daily. 30 tablet 0 Unknown at Unknown time  . hydrOXYzine (ATARAX/VISTARIL) 50 MG tablet Take 1 tablet (50 mg total) by mouth 3 (three) times daily as needed for anxiety. 90 tablet 1 Unknown at Unknown time    Previous Psychotropic Medications: Yes Celexa Depakote Trazodone Folic Acid Vistaril   Substance Abuse History in the last 12 months:  Yes.      Consequences of Substance Abuse: Legal Consequences:  2 DWI Blackouts:   Withdrawal Symptoms:   Diaphoresis Diarrhea Headaches Nausea Tremors  Results for orders placed or performed during the hospital encounter of 08/22/14 (from the past 72 hour(s))  Urine rapid drug screen (hosp performed) (Not at [facility])     Status: None   Collection Time: 08/22/14  7:00 PM  Result Value Ref Range   Opiates NONE DETECTED NONE DETECTED   Cocaine NONE DETECTED NONE DETECTED   Benzodiazepines NONE DETECTED NONE DETECTED   Amphetamines NONE DETECTED NONE DETECTED   Tetrahydrocannabinol NONE DETECTED NONE DETECTED   Barbiturates NONE DETECTED NONE DETECTED    Comment:        DRUG SCREEN FOR MEDICAL PURPOSES ONLY.  IF CONFIRMATION IS NEEDED FOR ANY PURPOSE, NOTIFY LAB WITHIN 5 DAYS.        LOWEST DETECTABLE LIMITS FOR  URINE DRUG SCREEN Drug Class       Cutoff (ng/mL) Amphetamine  1000 Barbiturate      200 Benzodiazepine   865 Tricyclics       784 Opiates          300 Cocaine          300 THC              50   Comprehensive metabolic panel     Status: Abnormal   Collection Time: 08/22/14  7:02 PM  Result Value Ref Range   Sodium 143 135 - 145 mmol/L   Potassium 3.7 3.5 - 5.1 mmol/L   Chloride 105 101 - 111 mmol/L   CO2 26 22 - 32 mmol/L   Glucose, Bld 103 (H) 65 - 99 mg/dL   BUN 10 6 - 20 mg/dL   Creatinine, Ser 0.86 0.61 - 1.24 mg/dL   Calcium 8.7 (L) 8.9 - 10.3 mg/dL   Total Protein 7.4 6.5 - 8.1 g/dL   Albumin 4.2 3.5 - 5.0 g/dL   AST 31 15 - 41 U/L   ALT 30 17 - 63 U/L   Alkaline Phosphatase 80 38 - 126 U/L   Total Bilirubin 0.6 0.3 - 1.2 mg/dL   GFR calc non Af Amer >60 >60 mL/min   GFR calc Af Amer >60 >60 mL/min    Comment: (NOTE) The eGFR has been calculated using the CKD EPI equation. This calculation has not been validated in all clinical situations. eGFR's persistently <60 mL/min signify possible Chronic Kidney Disease.    Anion gap 12 5 - 15  Ethanol (ETOH)     Status: Abnormal   Collection Time: 08/22/14  7:02 PM  Result Value Ref Range   Alcohol, Ethyl (B) 446 (HH) <5 mg/dL    Comment:        LOWEST DETECTABLE LIMIT FOR SERUM ALCOHOL IS 5 mg/dL FOR MEDICAL PURPOSES ONLY CRITICAL RESULT CALLED TO, READ BACK BY AND VERIFIED WITH: T.DOSTER,RN AT 2001 ON 08/22/14 BY W.SHEA   Salicylate level     Status: None   Collection Time: 08/22/14  7:02 PM  Result Value Ref Range   Salicylate Lvl <6.9 2.8 - 30.0 mg/dL  Acetaminophen level     Status: Abnormal   Collection Time: 08/22/14  7:02 PM  Result Value Ref Range   Acetaminophen (Tylenol), Serum <10 (L) 10 - 30 ug/mL    Comment:        THERAPEUTIC CONCENTRATIONS VARY SIGNIFICANTLY. A RANGE OF 10-30 ug/mL MAY BE AN EFFECTIVE CONCENTRATION FOR MANY PATIENTS. HOWEVER, SOME ARE BEST TREATED AT CONCENTRATIONS OUTSIDE  THIS RANGE. ACETAMINOPHEN CONCENTRATIONS >150 ug/mL AT 4 HOURS AFTER INGESTION AND >50 ug/mL AT 12 HOURS AFTER INGESTION ARE OFTEN ASSOCIATED WITH TOXIC REACTIONS.   CBC     Status: Abnormal   Collection Time: 08/22/14  7:02 PM  Result Value Ref Range   WBC 4.8 4.0 - 10.5 K/uL   RBC 4.41 4.22 - 5.81 MIL/uL   Hemoglobin 13.7 13.0 - 17.0 g/dL   HCT 41.1 39.0 - 52.0 %   MCV 93.2 78.0 - 100.0 fL   MCH 31.1 26.0 - 34.0 pg   MCHC 33.3 30.0 - 36.0 g/dL   RDW 14.2 11.5 - 15.5 %   Platelets 115 (L) 150 - 400 K/uL    Comment: RESULT REPEATED AND VERIFIED SPECIMEN CHECKED FOR CLOTS CONSISTENT WITH PREVIOUS RESULT     Observation Level/Precautions:  15 minute checks  Laboratory:  As per the ED  Psychotherapy: Individual/group   Medications:  Will detox with Librium  will reassess for other psychotropic agents  Consultations:    Discharge Concerns:    Estimated LOS: 3-5 days  Other:     Psychological Evaluations: No   Treatment Plan Summary: Daily contact with patient to assess and evaluate symptoms and progress in treatment and Medication management Supportive approach/coping skills Alcohol dependence; Librium detox protocol/work a relapse prevention plan Alcohol cravings; will start Campral 333 mg two TID Depression; will reassess for the use of an antidepressant Anxiety;will use CBT/mindfulness Explore residential treatment options vs CD IOP Medical Decision Making:  Review of Psycho-Social Stressors (1), Review or order clinical lab tests (1), Review of Medication Regimen & Side Effects (2) and Review of New Medication or Change in Dosage (2)  I certify that inpatient services furnished can reasonably be expected to improve the patient's condition.   Barney Russomanno A 8/15/20161:31 PM

## 2014-08-24 NOTE — Plan of Care (Signed)
Problem: Ineffective individual coping Goal: STG: Patient will remain free from self harm Outcome: Progressing Pt denies si and no self harm behaviors observed.

## 2014-08-25 NOTE — [facility] Group Notes (Signed)
[facility] LCSW Group Therapy  08/25/2014 11:39 AM  Type of Therapy:  Group Therapy  Participation Level:  Active  Participation Quality:  Attentive  Affect:  Appropriate  Cognitive:  Alert and Oriented  Insight:  Improving  Engagement in Therapy:  Engaged  Modes of Intervention:  Discussion, Education, Exploration, Problem-solving, Rapport Building, Socialization and Support  Summary of Progress/Problems: MHA Speaker came to talk about his personal journey with substance abuse and addiction. The pt processed ways by which to relate to the speaker. MHA speaker provided handouts and educational information pertaining to groups and services offered by the [facility].   Smart, Nedim Oki LCSWA  08/25/2014, 11:39 AM

## 2014-08-25 NOTE — Progress Notes (Signed)
D: Pt with flat affect endorses severe depression and anxiety with some anger with his parents. He rates both his depression and anxiety as a 9 on a 0-10 depression scale; he states, "My parents are just too controlling, they want to be in charge of my life." Pt however verbalizes that he needed to change his drinking habits because of his son. Pt says, "I am tired of having to blow into the breathalyzer every time I need to get my son; I need help with my drinking." Pt denies SI/HI/AVH; he states' "I will never hurt myself at list for my son"   A: Pt attended group Medications administered as prescribed.  Support, encouragement, and safe environment provided.  15-minute safety checks continue. R: Pt was med compliant.  Safety checks continue.

## 2014-08-25 NOTE — Progress Notes (Signed)
D: Per patient self inventory form patient reports he slept fair last night. He reports a fair appetite, low energy level, poor concentration. He rates depression 6/10, hopelessness 7/10, anxiety 9/10- all on 1-10 scale, 10 being the worse. He reports S/S of withdrawal including slight tremor and chills. He  Denies SI/HI. Denies AVH. Denies physical pain. Pt compliant with breathalyzer regimen.  A: Special checks q 15 mins in place for safety , Medication administered per MD order (see eMAR) Encouragement and support provided.   R: Safety maintained. Compliant with medication regimen. Will continue to monitor.

## 2014-08-25 NOTE — Progress Notes (Signed)
Recreation Therapy Notes  Animal-Assisted Activity (AAA) Program Checklist/Progress Notes Patient Eligibility Criteria Checklist & Daily Group note for Rec Tx Intervention  Date: 08.16.2016 Time: 2:45pm Location: [address] Dayroom    AAA/T Program Assumption of Risk Form signed by Patient/ or Parent Legal Guardian yes  Patient is free of allergies or sever asthma yes  Patient reports no fear of animals yes  Patient reports no history of cruelty to animals yes  Patient understands his/her participation is voluntary yes  Behavioral Response: Did not attend.    Maryum Batterson L Adaeze Better, LRT/CTRS        Tanasia Budzinski L 08/25/2014 3:16 PM 

## 2014-08-25 NOTE — Progress Notes (Signed)
[facility] Group Notes:  (Nursing/MHT/Case Management/Adjunct)  Date:  08/25/2014  Time:  2100  Type of Therapy:  wrap up group  Participation Level:  Active  Participation Quality:  Appropriate, Attentive, Sharing and Supportive  Affect:  Appropriate  Cognitive:  Appropriate  Insight:  Good  Engagement in Group:  Engaged  Modes of Intervention:  Clarification, Education and Support  Summary of Progress/Problems: Pt shares that his parents are often bothersome and worrisome to him. As he explained their concerns and involvement it sounded as if they were trying to be responsible for which he has been irresponsible. Pt had two years clean and reports shame over relapse. Pt is happy that his liver enzymes have returned to normal. Pt wants to get back into AA and have already reconnected with his AA peers.   Shelah Lewandowsky 08/25/2014, 9:57 PM

## 2014-08-25 NOTE — Progress Notes (Signed)
[facility] MD Progress Note  08/25/2014 6:40 PM SHOWN DISSINGER  MRN:  [id number] Subjective:  Ian [name] is trying to get his life back together. States he wants to be well for himself and his son. States he does not want his son to have to deal with this. He continues to be detox uneventfully. Campral was started and he reports no side effects to it Principal Problem: Alcohol use disorder, severe, dependence Diagnosis:   Patient Active Problem List   Diagnosis Date Noted  . Alcohol use disorder, severe, dependence [F10.20] 08/23/2014  . Substance induced mood disorder [F19.94] 08/23/2014  . Suicidal ideation [R45.851] 08/23/2014  . Pancreatitis [K85.9] 01/30/2014  . Acute pancreatitis [K85.9] 01/30/2014  . Abdominal pain [R10.9] 01/30/2014  . Nausea and vomiting [R11.2] 01/30/2014  . Hyponatremia [E87.1] 01/30/2014  . Leukocytosis [D72.829]   . Nicotine abuse [F19.10] 08/14/2013  . GAD (generalized anxiety disorder) [F41.1] 08/14/2013  . Major depressive disorder, recurrent episode, moderate [F33.1] 08/14/2013  . ABDOMINAL PAIN OTHER SPECIFIED SITE [R10.84] 08/05/2009  . CHEST PAIN [R07.9] 01/19/2009  . CERUMEN IMPACTION [H61.20] 07/31/2008  . ASTHMA [J45.909] 07/31/2008   Total Time spent with patient: 30 minutes   Past Medical History:  Past Medical History  Diagnosis Date  . Anxiety   . Depression   . Hypertension   . Asthma   . Alcohol dependence     Past Surgical History  Procedure Laterality Date  . No past surgeries     Family History:  Family History  Problem Relation Age of Onset  . OCD Mother   . Suicidality Neg Hx    Social History:  History  Alcohol Use  . Yes    Comment: varies how much     History  Drug Use No    Social History   Social History  . Marital Status: Single    Spouse Name: N/A  . Number of Children: N/A  . Years of Education: N/A   Social History Main Topics  . Smoking status: Former Smoker    Types: Cigarettes    Quit date:  01/31/1999  . Smokeless tobacco: Never Used  . Alcohol Use: Yes     Comment: varies how much  . Drug Use: No  . Sexual Activity: Yes    Birth Control/ Protection: Condom   Other Topics Concern  . None   Social History Narrative   Additional History:    Sleep: Fair  Appetite:  Fair   Assessment:   Musculoskeletal: Strength & Muscle Tone: within normal limits Gait & Station: normal Patient leans: normal   Psychiatric Specialty Exam: Physical Exam  Review of Systems  Constitutional: Negative.   HENT: Negative.   Eyes: Negative.   Respiratory: Negative.   Cardiovascular: Negative.   Gastrointestinal: Negative.   Genitourinary: Negative.   Musculoskeletal: Negative.   Skin: Negative.   Neurological: Negative.   Endo/Heme/Allergies: Negative.   Psychiatric/Behavioral: Positive for substance abuse. The patient is nervous/anxious.     Blood pressure 131/92, pulse 62, temperature 97.9 F (36.6 C), temperature source Oral, resp. rate 18, height 5' 11.5" (1.816 m), weight 96.163 kg (212 lb).Body mass index is 29.16 kg/(m^2).  General Appearance: Fairly Groomed  Patent attorney::  Fair  Speech:  Clear and Coherent  Volume:  Normal  Mood:  Anxious, Depressed and worried  Affect:  anxious worried  Thought Process:  Coherent and Goal Directed  Orientation:  Full (Time, Place, and Person)  Thought Content:  symptoms events worries concerns  Suicidal  Thoughts:  No  Homicidal Thoughts:  No  Memory:  Immediate;   Fair Recent;   Fair Remote;   Fair  Judgement:  Fair  Insight:  Present  Psychomotor Activity:  Restlessness  Concentration:  Fair  Recall:  [employer] of Knowledge:Fair  Language: Fair  Akathisia:  No  Handed:  Right  AIMS (if indicated):     Assets:  Desire for Improvement Housing Social Support  ADL's:  Intact  Cognition: WNL  Sleep:  Number of Hours: 5     Current Medications: Current Facility-Administered Medications  Medication Dose Route  Frequency Provider Last Rate Last Dose  . acamprosate (CAMPRAL) tablet 666 mg  666 mg Oral TID WC Rachael Fee, MD   666 mg at 08/25/14 1715  . acetaminophen (TYLENOL) tablet 650 mg  650 mg Oral Q6H PRN Thermon Leyland, NP   650 mg at 08/25/14 0929  . albuterol (PROVENTIL HFA;VENTOLIN HFA) 108 (90 BASE) MCG/ACT inhaler 2 puff  2 puff Inhalation Q4H PRN Thermon Leyland, NP   2 puff at 08/25/14 1206  . alum & mag hydroxide-simeth (MAALOX/MYLANTA) 200-200-20 MG/5ML suspension 30 mL  30 mL Oral Q4H PRN Thermon Leyland, NP      . chlordiazePOXIDE (LIBRIUM) capsule 25 mg  25 mg Oral Q6H PRN Thermon Leyland, NP   25 mg at 08/24/14 0555  . [START ON 08/26/2014] chlordiazePOXIDE (LIBRIUM) capsule 25 mg  25 mg Oral BH-qamhs Thermon Leyland, NP       Followed by  . [START ON 08/27/2014] chlordiazePOXIDE (LIBRIUM) capsule 25 mg  25 mg Oral Daily Thermon Leyland, NP      . hydrOXYzine (ATARAX/VISTARIL) tablet 25 mg  25 mg Oral Q6H PRN Thermon Leyland, NP   25 mg at 08/25/14 1455  . lisinopril (PRINIVIL,ZESTRIL) tablet 5 mg  5 mg Oral Daily Thermon Leyland, NP   5 mg at 08/25/14 9604  . loperamide (IMODIUM) capsule 2-4 mg  2-4 mg Oral PRN Thermon Leyland, NP      . magnesium hydroxide (MILK OF MAGNESIA) suspension 30 mL  30 mL Oral Daily PRN Thermon Leyland, NP      . multivitamin with minerals tablet 1 tablet  1 tablet Oral Daily Thermon Leyland, NP   1 tablet at 08/25/14 [id number]  . ondansetron (ZOFRAN-ODT) disintegrating tablet 4 mg  4 mg Oral Q6H PRN Thermon Leyland, NP      . thiamine (B-1) injection 100 mg  100 mg Intramuscular Once Thermon Leyland, NP   100 mg at 08/23/14 1821  . thiamine (VITAMIN B-1) tablet 100 mg  100 mg Oral Daily Thermon Leyland, NP   100 mg at 08/25/14 8119  . traZODone (DESYREL) tablet 100 mg  100 mg Oral QHS PRN Thermon Leyland, NP   100 mg at 08/24/14 2307    Lab Results:  Results for orders placed or performed during the hospital encounter of 08/23/14 (from the past 48 hour(s))  Lipase, blood     Status: None    Collection Time: 08/24/14  7:24 PM  Result Value Ref Range   Lipase 37 22 - 51 U/L    Comment: Performed at [facility]    Physical Findings: AIMS: Facial and Oral Movements Muscles of Facial Expression: None, normal Lips and Perioral Area: None, normal Jaw: None, normal Tongue: None, normal,Extremity Movements Upper (arms, wrists, hands, fingers): None, normal Lower (legs, knees, ankles, toes):  None, normal, Trunk Movements Neck, shoulders, hips: None, normal, Overall Severity Severity of abnormal movements (highest score from questions above): None, normal Incapacitation due to abnormal movements: None, normal Patient's awareness of abnormal movements (rate only patient's report): No Awareness, Dental Status Current problems with teeth and/or dentures?: No Does patient usually wear dentures?: No  CIWA:  CIWA-Ar Total: 2 COWS:     Treatment Plan Summary: Daily contact with patient to assess and evaluate symptoms and progress in treatment and Medication management Supportive approach/coping skills Alcohol dependence; continue the Librium detox protocol Work a relapse prevention plan Cravings; continue the Campral 333 mg two TID Insomnia; continue the Trazodone at the 100 mg dose Work with CBT/mindfulness Medical Decision Making:  Review of Psycho-Social Stressors (1), Review or order clinical lab tests (1) and Review of Medication Regimen & Side Effects (2)     Ian [name] A 08/25/2014, 6:40 PM

## 2014-08-25 NOTE — [facility] Group Notes (Signed)
[facility] Group Notes:  (Nursing/MHT/Case Management/Adjunct)  Date:  08/25/2014  Time:0900 Type of Therapy:  Nurse Education  Participation Level:  Active  Participation Quality:  Appropriate and Attentive  Affect:  Appropriate  Cognitive:  Alert and Appropriate  Insight:  Good  Engagement in Group:  Improving  Modes of Intervention:  Activity, Discussion, Education, Socialization and Support  Summary of Progress/Problems:  Dara Hoyer 08/25/2014, 10:03 AM

## 2014-08-25 NOTE — Progress Notes (Signed)
Patient on hall way during this assessment. Mood and affects appropriate. He endorsed a good day. Denied SI/HI and denied Hallucinations. Writer encouraged and supported patient. Q 15 minute check continues as ordered to maintain safety.

## 2014-08-26 MED ORDER — TRAZODONE HCL 100 MG PO TABS: 100.0000 mg | ORAL_TABLET | Freq: Every evening | ORAL | Status: DC | PRN

## 2014-08-26 MED ORDER — HYDROXYZINE HCL 50 MG PO TABS: 50.0000 mg | ORAL_TABLET | Freq: Four times a day (QID) | ORAL | Status: DC | PRN

## 2014-08-26 MED ORDER — CHLORDIAZEPOXIDE HCL 25 MG PO CAPS: 25.0000 mg | ORAL_CAPSULE | Freq: Four times a day (QID) | ORAL | Status: DC | PRN

## 2014-08-26 MED ORDER — LISINOPRIL 5 MG PO TABS: 5.0000 mg | ORAL_TABLET | Freq: Every day | ORAL | Status: DC

## 2014-08-26 MED ORDER — ACAMPROSATE CALCIUM 333 MG PO TBEC: 666.0000 mg | DELAYED_RELEASE_TABLET | Freq: Three times a day (TID) | ORAL | Status: DC

## 2014-08-26 MED ORDER — HYDROXYZINE HCL 25 MG PO TABS: 25.0000 mg | ORAL_TABLET | Freq: Four times a day (QID) | ORAL | Status: DC | PRN

## 2014-08-26 NOTE — Tx Team (Signed)
Interdisciplinary Treatment Plan Update (Adult)  Date:  08/26/2014  Time Reviewed:  10:58 AM   Progress in Treatment: Attending groups: Yes. Participating in groups:  Yes. Taking medication as prescribed:  Yes. Tolerating medication:  Yes. Family/Significant othe contact made:  Patient understands diagnosis:  Yes. and As evidenced by:  seeking treatment for ETOH abuse/detox, depression, and med stabilization. Discussing patient identified problems/goals with staff:  Yes. Medical problems stabilized or resolved:  Yes. Denies suicidal/homicidal ideation: Yes. Issues/concerns per patient self-inventory:   Discharge Plan or Barriers: Pt reports that he wants to be seen by either [facility] or [facility], rather than [facility]. He plans to return home at d/c. [facility] info also provided to pt. CSW assessing for appropriate referrals.   Reason for Continuation of Hospitalization: none  Comments:  Ian [name] is an 34 y.o. male who presents to [facility] as a Walk In with the presenting problem of suicidal ideations and excessive alcohol consumption. Patient was observed to be under the influence of alcohol during the assessment AEB slurred speech and limited eye contact. He stated he is currently having suicidal thoughts due to feeling as if he is failure in life. He reports that his alcohol use has strained his relationship with others and that his parents see him as a disappointment. Patient reports that he has struggled with depression since the age of 34 years old and now currently rates his depression as a 8 (on a scale from 1-10 with 10 representing extreme depression). He states that he has not attempted suicide in the past but has received inpatient treatment for his alcoholism at [place] [facility] [facility] in 2011. Patient reports depressive symptoms that include: insomnia, guilt, feelings of angry/irritability, and fatigue. He shares that he consumes  alcohol daily and has weeks where he binges on a variety of different amounts. Patient reports no current outpatient providers at this time but states that he currently is taking Citalopram, Depakote, and Folic Acid. Pt reports dx of Major Depression, Recurrent severe and Alcohol Use Disorder.  Estimated length of stay:  D/c today   New goal(s): to formulate aftercare plan.   Additional Comments:  Patient and CSW reviewed pt's identified goals and treatment plan. Patient verbalized understanding and agreed to treatment plan. CSW reviewed [facility] "Discharge Process and Patient Involvement" Form. Pt verbalized understanding of information provided and signed form.    Review of initial/current patient goals per problem list:  1. Goal(s): Patient will participate in aftercare plan  Met:Yes   Target date: at discharge  As evidenced by: Patient will participate within aftercare plan AEB aftercare provider and housing plan at discharge being identified.  8/15: Pt requesting referral to [facility] or [facility]. CSW assessing.   8/17: Pt to follow up at [facility]  2. Goal (s): Patient will exhibit decreased depressive symptoms and suicidal ideations.  Met: Yes    Target date: at discharge  As evidenced by: Patient will utilize self rating of depression at 3 or below and demonstrate decreased signs of depression or be deemed stable for discharge by MD.  8/15: Pt rates depression as 9/10 and presents with depressed mood/anxious affect. Denies Si/Hi/AVH.  8/16: Pt rates depression as 0/10 and presents with pleasant mood/calm affect.    3. Goal(s): Patient will demonstrate decreased signs and symptoms of anxiety.  Met:Yes .   Target date: at discharge  As evidenced by: Patient will utilize self rating of anxiety  at 3 or below and demonstrated decreased signs of anxiety, or be deemed stable for discharge by MD  8/15: Pt rates anxiety as 10/10  and presents with depressed mood/anxious affect.   8/17: Pt rates anxiety as 3/10 and presents with pleasant mood and calm affect.  4. Goal(s): Patient will demonstrate decreased signs of withdrawal due to substance abuse  Met:Yes   Target date:at discharge   As evidenced by: Patient will produce a CIWA/COWS score of 0, have stable vitals signs, and no symptoms of withdrawal.  8/15: Pt reports mild-moderate withdrawals with CIWA score of 6 and high sitting/standing BP.   8/17: Pt reports no signs of withdrawal with CIWA score of 0 and stable vitals.   Attendees: Patient:   08/26/2014 10:58 AM   Family:   08/26/2014 10:58 AM   Physician:  Dr. Carlton Adam, MD 08/26/2014 10:58 AM   Nursing:  Lorayne Marek RN 08/26/2014 10:58 AM   Clinical Social Worker: Maxie Better, [place]  08/26/2014 10:58 AM   Clinical Social Worker: Erasmo Downer Drinkard LCSWA; Peri Maris LCSWA 08/26/2014 10:58 AM   Other:  Gerline Legacy Nurse Case Manager 08/26/2014 10:58 AM   Other:  Lucinda Dell; [facility]  08/26/2014 10:58 AM   Other:   08/26/2014 10:58 AM   Other:  08/26/2014 10:58 AM   Other:  08/26/2014 10:58 AM   Other:  08/26/2014 10:58 AM    08/26/2014 10:58 AM    08/26/2014 10:58 AM    08/26/2014 10:58 AM    08/26/2014 10:58 AM    Scribe for Treatment Team:   Maxie Better, [place]  08/26/2014 10:58 AM

## 2014-08-26 NOTE — Progress Notes (Signed)
D: Per patient self inventory form patient reports he slept good last night. He reports a good appetite, low energy level, poor concentration. He rates depression 6/10, hopelessness 8/10, anxiety 8/10, all on 1-10 scale, 10 being the worse. He denies physical problems. Behavior calm and cooperative. Denies withdrawal symptoms. He denies SI/HI. Denies AVH. He reports his goal for the day sis "IOP follow up, psy follow up, aftercare." he reports he will meet his goal by "speak with social workers and drs." Observed interacting on the unit. Compliant with breathalyzer regimen.   A:Special checks q 15 mins in place for safety. Medication administered per MD order (see eMAR), Encouragement and support provided. Discharge planning in place.  R: Safety maintained. Compliant with medication regimen. Will continue to monitor.

## 2014-08-26 NOTE — Progress Notes (Signed)
  [facility] Adult Case Management Discharge Plan :  Will you be returning to the same living situation after discharge:  Yes,  home At discharge, do you have transportation home?: Yes, car in parking lot.  Do you have the ability to pay for your medications: Yes,  [employer]  Release of information consent forms completed and submitted to medical records by CSW.  Patient to Follow up at: Follow-up Information    Follow up with [facility] On 09/07/2014.   Why:  Appt on this date at 10:30AM with Samule Dry for therapy.    Contact information:   [address] [place], [place] [address] Phone: [phone] Fax: [phone]      Follow up with [facility] On 09/15/2014.   Why:  Appt on this date at 11:30AM with Hazel Sams, NP for medication management.    Contact information:   [address] [place], [place] [address] Phone: [phone] Fax: [phone]      Patient denies SI/HI: Yes,  during group/self report    Safety Planning and Suicide Prevention discussed: Yes,  SPE completed with pt's mother.  Have you used any form of tobacco in the last 30 days? (Cigarettes, Smokeless Tobacco, Cigars, and/or Pipes): Yes  Has patient been referred to the Quitline?: Patient refused referral  Smart, Lebron Quam 08/26/2014, 11:06 AM

## 2014-08-26 NOTE — Discharge Summary (Signed)
Physician Discharge Summary Note  Patient:  Ian [name] is an 34 y.o., male MRN:  [id number] DOB:  1980/06/24 Patient phone:  [phone] (home)  Patient address:   [address] [place] [address],  Total Time spent with patient: 45 minutes  Date of Admission:  08/23/2014 Date of Discharge: 08/26/2014  Reason for Admission:  ETOH abuse  Principal Problem: Alcohol use disorder, severe, dependence Discharge Diagnoses: Patient Active Problem List   Diagnosis Date Noted  . Alcohol use disorder, severe, dependence [F10.20] 08/23/2014  . Substance induced mood disorder [F19.94] 08/23/2014  . Suicidal ideation [R45.851] 08/23/2014  . Pancreatitis [K85.9] 01/30/2014  . Acute pancreatitis [K85.9] 01/30/2014  . Abdominal pain [R10.9] 01/30/2014  . Nausea and vomiting [R11.2] 01/30/2014  . Hyponatremia [E87.1] 01/30/2014  . Leukocytosis [D72.829]   . Nicotine abuse [F19.10] 08/14/2013  . GAD (generalized anxiety disorder) [F41.1] 08/14/2013  . Major depressive disorder, recurrent episode, moderate [F33.1] 08/14/2013  . ABDOMINAL PAIN OTHER SPECIFIED SITE [R10.84] 08/05/2009  . CHEST PAIN [R07.9] 01/19/2009  . CERUMEN IMPACTION [H61.20] 07/31/2008  . ASTHMA [J45.909] 07/31/2008    Musculoskeletal: Strength & Muscle Tone: within normal limits Gait & Station: normal Patient leans: N/A  Psychiatric Specialty Exam: Physical Exam  Vitals reviewed. Cardiovascular: Intact distal pulses.     Review of Systems  All other systems reviewed and are negative.   Blood pressure 131/90, pulse 81, temperature 98.1 F (36.7 C), temperature source Oral, resp. rate 16, height 5' 11.5" (1.816 m), weight 96.163 kg (212 lb).Body mass index is 29.16 kg/(m^2).   General Appearance: Fairly Groomed  Patent attorney:: Fair  Speech: Clear and Coherent  Volume: Normal  Mood: Anxious and worried  Affect: Appropriate  Thought Process: Coherent and Goal Directed  Orientation: Full  (Time, Place, and Person)  Thought Content: plans as he moves on, relapse prevention plan  Suicidal Thoughts: No  Homicidal Thoughts: No  Memory: Immediate; Fair Recent; Fair Remote; Fair  Judgement: Fair  Insight: Present  Psychomotor Activity: Normal  Concentration: Fair  Recall: [employer] of Knowledge:Fair  Language: Fair  Akathisia: No  Handed: Right  AIMS (if indicated):    Assets: Desire for Improvement Housing Talents/Skills  Sleep: Number of Hours: 3.5  Cognition: WNL  ADL's: Intact       Have you used any form of tobacco in the last 30 days? (Cigarettes, Smokeless Tobacco, Cigars, and/or Pipes): Yes  Has this patient used any form of tobacco in the last 30 days? (Cigarettes, Smokeless Tobacco, Cigars, and/or Pipes) N/A  Past Medical History:  Past Medical History  Diagnosis Date  . Anxiety   . Depression   . Hypertension   . Asthma   . Alcohol dependence     Past Surgical History  Procedure Laterality Date  . No past surgeries     Family History:  Family History  Problem Relation Age of Onset  . OCD Mother   . Suicidality Neg Hx    Social History:  History  Alcohol Use  . Yes    Comment: varies how much     History  Drug Use No    Social History   Social History  . Marital Status: Single    Spouse Name: N/A  . Number of Children: N/A  . Years of Education: N/A   Social History Main Topics  . Smoking status: Former Smoker    Types: Cigarettes    Quit date: 01/31/1999  . Smokeless tobacco: Never Used  . Alcohol  Use: Yes     Comment: varies how much  . Drug Use: No  . Sexual Activity: Yes    Birth Control/ Protection: Condom   Other Topics Concern  . None   Social History Narrative   Risk to Self: Is patient at risk for suicide?: No Risk to Others:   Prior Inpatient Therapy:   Prior Outpatient Therapy:    Level of Care:  OP  Hospital Course:   Ian [name], 34 Y/O male who states  he suffers from anxiety, depression and ETOH abuse.  He states a history of drinking under the influence and has posed problems with custodial rights to his children.  He came in voluntarily to get assistance as a walk in with c/o of suicidal thoughts.  Ian [name] was admitted for Alcohol use disorder, severe, dependence and crisis management.  He was treated discharged with the medications listed below under Medication List.  Medical problems were identified and treated as needed.  Home medications were restarted as appropriate.  Improvement was monitored by observation and Ian [name] daily report of symptom reduction.  Emotional and mental status was monitored by daily self-inventory reports completed by Ian [name] and clinical staff.         Ian [name] was evaluated by the treatment team for stability and plans for continued recovery upon discharge.  Ian [name] motivation was an integral factor for scheduling further treatment.  Employment, transportation, bed availability, health status, family support, and any pending legal issues were also considered during his hospital stay.  He was offered further treatment options upon discharge including but not limited to Residential, Intensive Outpatient, and Outpatient treatment.  Ian [name] will follow up with the services as listed below under Follow Up Information.     Upon completion of this admission the patient was both mentally and medically stable for discharge denying suicidal/homicidal ideation, auditory/visual/tactile hallucinations, delusional thoughts and paranoia.      Consults:  psychiatry  Significant Diagnostic Studies:  labs: per lab  Discharge Vitals:   Blood pressure 131/90, pulse 81, temperature 98.1 F (36.7 C), temperature source Oral, resp. rate 16, height 5' 11.5" (1.816 m), weight 96.163 kg (212 lb). Body mass index is 29.16 kg/(m^2). Lab Results:   Results for orders placed or  performed during the hospital encounter of 08/23/14 (from the past 72 hour(s))  Lipase, blood     Status: None   Collection Time: 08/24/14  7:24 PM  Result Value Ref Range   Lipase 37 22 - 51 U/L    Comment: Performed at [facility]    Physical Findings: AIMS: Facial and Oral Movements Muscles of Facial Expression: None, normal Lips and Perioral Area: None, normal Jaw: None, normal Tongue: None, normal,Extremity Movements Upper (arms, wrists, hands, fingers): None, normal Lower (legs, knees, ankles, toes): None, normal, Trunk Movements Neck, shoulders, hips: None, normal, Overall Severity Severity of abnormal movements (highest score from questions above): None, normal Incapacitation due to abnormal movements: None, normal Patient's awareness of abnormal movements (rate only patient's report): No Awareness, Dental Status Current problems with teeth and/or dentures?: No Does patient usually wear dentures?: No  CIWA:  CIWA-Ar Total: 0 COWS:      See Psychiatric Specialty Exam and Suicide Risk Assessment completed by Attending Physician prior to discharge.  Discharge destination:  Home  Is patient on multiple antipsychotic therapies at discharge:  No   Has Patient had three or more failed trials of antipsychotic  monotherapy by history:  No    Recommended Plan for Multiple Antipsychotic Therapies: NA     Medication List    STOP taking these medications        ALPRAZolam 0.5 MG tablet  Commonly known as:  XANAX     citalopram 40 MG tablet  Commonly known as:  CELEXA     divalproex 500 MG DR tablet  Commonly known as:  DEPAKOTE     folic acid 1 MG tablet  Commonly known as:  FOLVITE      TAKE these medications      Indication   acamprosate 333 MG tablet  Commonly known as:  CAMPRAL  Take 2 tablets (666 mg total) by mouth 3 (three) times daily with meals.   Indication:  Excessive Use of Alcohol     chlordiazePOXIDE 25 MG capsule  Commonly  known as:  LIBRIUM  Take 1 capsule (25 mg total) by mouth every 6 (six) hours as needed for withdrawal.   Indication:  Agitation     hydrOXYzine 50 MG tablet  Commonly known as:  ATARAX/VISTARIL  Take 1 tablet (50 mg total) by mouth every 6 (six) hours as needed for anxiety.   Indication:  Anxiety Neurosis     lisinopril 5 MG tablet  Commonly known as:  PRINIVIL,ZESTRIL  Take 1 tablet (5 mg total) by mouth daily.   Indication:  Hypertension associated with alcohol detox     traZODone 100 MG tablet  Commonly known as:  DESYREL  Take 1 tablet (100 mg total) by mouth at bedtime as needed for sleep.   Indication:  Trouble Sleeping       Follow-up Information    Follow up with [facility] On 09/07/2014.   Why:  Appt on this date at 10:30AM with Samule Dry for therapy.    Contact information:   [address] [place], [place] [address] Phone: [phone] Fax: [phone]      Follow up with [facility] On 09/15/2014.   Why:  Appt on this date at 11:30AM with Hazel Sams, NP for medication management.    Contact information:   [address] [place], [place] [address] Phone: [phone] Fax: [phone]      Follow-up recommendations:  Activity:  as tol, diet as tol  Comments:  1.  Take all your medications as prescribed.              2.  Report any adverse side effects to outpatient provider.                       3.  Patient instructed to not use alcohol or illegal drugs while on prescription medicines.            4.  In the event of worsening symptoms, instructed patient to call 911, the crisis hotline or go to nearest emergency room for evaluation of symptoms.  Total Discharge Time:  45 min  Signed: Velna Hatchet May Agustin AGNP-BC 08/26/2014, 2:54 PM  I personally assessed the patient and formulated the plan Madie Reno A. Dub Mikes, M.D.

## 2014-08-26 NOTE — [facility] Suicide Risk Assessment (Signed)
[facility] INPATIENT:  Family/Significant Other Suicide Prevention Education  Suicide Prevention Education:  Education Completed; Ian [name] (pt's mother) [phone] identified by the patient as the family member/significant other with whom the patient will be residing, and identified as the person(s) who will aid the patient in the event of a mental health crisis (suicidal ideations/suicide attempt).  With written consent from the patient, the family member/significant other has been provided the following suicide prevention education, prior to the and/or following the discharge of the patient.  The suicide prevention education provided includes the following:  Suicide risk factors  Suicide prevention and interventions  National Suicide Hotline telephone number  [facility] assessment telephone number  [facility] Emergency Assistance 911  [facility] and/or Residential Mobile Crisis Unit telephone number  Request made of family/significant other to:  Remove weapons (e.g., guns, rifles, knives), all items previously/currently identified as safety concern.    Remove drugs/medications (over-the-counter, prescriptions, illicit drugs), all items previously/currently identified as a safety concern.  The family member/significant other verbalizes understanding of the suicide prevention education information provided.  The family member/significant other agrees to remove the items of safety concern listed above.  Ian [name], Ian [name] LCSWA 08/26/2014, 11:02 AM

## 2014-08-26 NOTE — Progress Notes (Signed)
Discharge note: Patient discharged per MD order. Behavior calm and cooperative.Discharge summary reviewed with pt. Pt verbalizes understanding. Medication regimen reviewed with pt. Sample medication and RX given. Patient denies SI/HI, Denies AVH. Denies physical pain. All items returned to pt from locker #50. Pt verbalizes and signs that he received all items from locker. Ambulatory out of facility into lobby.

## 2014-08-26 NOTE — [facility] Suicide Risk Assessment (Signed)
[facility] Discharge Suicide Risk Assessment   Demographic Factors:  Male and Cardell Peach, lesbian, or bisexual orientation  Total Time spent with patient: 30 minutes  Musculoskeletal: Strength & Muscle Tone: within normal limits Gait & Station: normal Patient leans: normal  Psychiatric Specialty Exam: Physical Exam  Review of Systems  Constitutional: Negative.   HENT: Negative.   Eyes: Negative.   Respiratory: Negative.   Cardiovascular: Negative.   Gastrointestinal: Negative.   Genitourinary: Negative.   Musculoskeletal: Negative.   Skin: Negative.   Neurological: Negative.   Endo/Heme/Allergies: Negative.   Psychiatric/Behavioral: Positive for substance abuse. The patient is nervous/anxious.     Blood pressure 131/90, pulse 81, temperature 98.1 F (36.7 C), temperature source Oral, resp. rate 16, height 5' 11.5" (1.816 m), weight 96.163 kg (212 lb).Body mass index is 29.16 kg/(m^2).  General Appearance: Fairly Groomed  Patent attorney::  Fair  Speech:  Clear and Coherent409  Volume:  Normal  Mood:  Anxious and worried  Affect:  Appropriate  Thought Process:  Coherent and Goal Directed  Orientation:  Full (Time, Place, and Person)  Thought Content:  plans as he moves on, relapse prevention plan  Suicidal Thoughts:  No  Homicidal Thoughts:  No  Memory:  Immediate;   Fair Recent;   Fair Remote;   Fair  Judgement:  Fair  Insight:  Present  Psychomotor Activity:  Normal  Concentration:  Fair  Recall:  [employer] of Knowledge:Fair  Language: Fair  Akathisia:  No  Handed:  Right  AIMS (if indicated):     Assets:  Desire for Improvement Housing Talents/Skills  Sleep:  Number of Hours: 3.5  Cognition: WNL  ADL's:  Intact   Have you used any form of tobacco in the last 30 days? (Cigarettes, Smokeless Tobacco, Cigars, and/or Pipes): Yes  Has this patient used any form of tobacco in the last 30 days? (Cigarettes, Smokeless Tobacco, Cigars, and/or Pipes) Yes, A prescription for an  FDA-approved tobacco cessation medication was offered at discharge and the patient refused  Mental Status Per Nursing Assessment::   On Admission:     Current Mental Status by Physician: In full contact with reality. There are no active SI plans or intent. There are no active S/S of withdrawal. He is willing and motivated to pursue outpatient follow up   Loss Factors: NA  Historical Factors: NA  Risk Reduction Factors:   Sense of responsibility to family, Employed, Living with another person, especially a relative and Positive social support  Continued Clinical Symptoms:  Alcohol/Substance Abuse/Dependencies  Cognitive Features That Contribute To Risk:  None    Suicide Risk:  Minimal: No identifiable suicidal ideation.  Patients presenting with no risk factors but with morbid ruminations; may be classified as minimal risk based on the severity of the depressive symptoms  Principal Problem: Alcohol use disorder, severe, dependence Discharge Diagnoses:  Patient Active Problem List   Diagnosis Date Noted  . Alcohol use disorder, severe, dependence [F10.20] 08/23/2014  . Substance induced mood disorder [F19.94] 08/23/2014  . Suicidal ideation [R45.851] 08/23/2014  . Pancreatitis [K85.9] 01/30/2014  . Acute pancreatitis [K85.9] 01/30/2014  . Abdominal pain [R10.9] 01/30/2014  . Nausea and vomiting [R11.2] 01/30/2014  . Hyponatremia [E87.1] 01/30/2014  . Leukocytosis [D72.829]   . Nicotine abuse [F19.10] 08/14/2013  . GAD (generalized anxiety disorder) [F41.1] 08/14/2013  . Major depressive disorder, recurrent episode, moderate [F33.1] 08/14/2013  . ABDOMINAL PAIN OTHER SPECIFIED SITE [R10.84] 08/05/2009  . CHEST PAIN [R07.9] 01/19/2009  . CERUMEN IMPACTION [  H61.20] 07/31/2008  . ASTHMA [J45.909] 07/31/2008    Follow-up Information    Follow up with [facility] On 09/07/2014.   Why:  Appt on this date at 10:30AM with Samule Dry for therapy.     Contact information:   [address] [place], [place] [address] Phone: [phone] Fax: [phone]      Follow up with [facility] On 09/15/2014.   Why:  Appt on this date at 11:30AM with Hazel Sams, NP for medication management.    Contact information:   [address] [place], [place] [address] Phone: [phone] Fax: [phone]      Plan Of Care/Follow-up recommendations:  Activity:  as tolerated Diet:  regular Follow up as above Is patient on multiple antipsychotic therapies at discharge:  No   Has Patient had three or more failed trials of antipsychotic monotherapy by history:  No  Recommended Plan for Multiple Antipsychotic Therapies: NA    Bekim Werntz A 08/26/2014, 1:04 PM

## 2014-08-26 NOTE — [facility] Group Notes (Signed)
[facility] LCSW Aftercare Discharge Planning Group Note   08/26/2014 10:33 AM  Participation Quality:  Appropriate   Mood/Affect:  Appropriate  Depression Rating:      Anxiety Rating:  3-"I'm alittle nervous about leaving the safety of the hospital but I'm ready."   Thoughts of Suicide:  No Will you contract for safety?   NA  Current AVH:  No  Plan for Discharge/Comments:  [facility] and plans to go to [facility] for groups. Pt given info to [facility] and plans to set up his own appt after being assessed.    Transportation Means:  Car in parking lot   Supports: Administrator, sports, Oncologist

## 2014-12-10 ENCOUNTER — Emergency Department (HOSPITAL_COMMUNITY)
Admission: EM | Admit: 2014-12-10 | Discharge: 2014-12-11 | Disposition: A | Discharge status: Home / Self Care | Payer: No Typology Code available for payment source | Attending: Emergency Medicine | Admitting: Emergency Medicine

## 2014-12-10 ENCOUNTER — Encounter (HOSPITAL_COMMUNITY): Payer: Self-pay | Admitting: Emergency Medicine

## 2014-12-10 DIAGNOSIS — Z79899 Other long term (current) drug therapy: Secondary | ICD-10-CM | POA: Insufficient documentation

## 2014-12-10 DIAGNOSIS — F419 Anxiety disorder, unspecified: Secondary | ICD-10-CM | POA: Insufficient documentation

## 2014-12-10 DIAGNOSIS — F10220 Alcohol dependence with intoxication, uncomplicated: Secondary | ICD-10-CM | POA: Diagnosis not present

## 2014-12-10 DIAGNOSIS — I10 Essential (primary) hypertension: Secondary | ICD-10-CM | POA: Insufficient documentation

## 2014-12-10 DIAGNOSIS — Z87891 Personal history of nicotine dependence: Secondary | ICD-10-CM | POA: Insufficient documentation

## 2014-12-10 DIAGNOSIS — F329 Major depressive disorder, single episode, unspecified: Secondary | ICD-10-CM | POA: Insufficient documentation

## 2014-12-10 DIAGNOSIS — F10129 Alcohol abuse with intoxication, unspecified: Secondary | ICD-10-CM | POA: Diagnosis present

## 2014-12-10 DIAGNOSIS — Z8719 Personal history of other diseases of the digestive system: Secondary | ICD-10-CM | POA: Diagnosis not present

## 2014-12-10 DIAGNOSIS — J45909 Unspecified asthma, uncomplicated: Secondary | ICD-10-CM | POA: Diagnosis not present

## 2014-12-10 DIAGNOSIS — F10920 Alcohol use, unspecified with intoxication, uncomplicated: Secondary | ICD-10-CM

## 2014-12-10 HISTORY — DX: Acute pancreatitis without necrosis or infection, unspecified: K85.90

## 2014-12-10 NOTE — ED Notes (Signed)
Pt arrives via EMS from home heavily intoxicated, states drinks a half a gallon of vodka daily. Wants detox. Stumbling in triage.

## 2014-12-11 LAB — RAPID URINE DRUG SCREEN, HOSP PERFORMED
AMPHETAMINES: NOT DETECTED
BENZODIAZEPINES: NOT DETECTED
Barbiturates: NOT DETECTED
Cocaine: NOT DETECTED
OPIATES: NOT DETECTED
TETRAHYDROCANNABINOL: NOT DETECTED

## 2014-12-11 MED ORDER — THIAMINE HCL 100 MG/ML IJ SOLN
Freq: Once | INTRAVENOUS | Status: AC
  Administered 2014-12-11: 03:00:00 via INTRAVENOUS
  Filled 2014-12-11 (×2): qty 1000

## 2014-12-11 MED ORDER — CHLORDIAZEPOXIDE HCL 25 MG PO CAPS: ORAL_CAPSULE | ORAL | Status: DC

## 2014-12-11 NOTE — ED Provider Notes (Signed)
CSN: [id number]     Arrival date & time 12/10/14  2348 History   First MD Initiated Contact with Patient 12/11/14 0058     Chief Complaint  Patient presents with  . Alcohol Problem     (Consider location/radiation/quality/duration/timing/severity/associated sxs/prior Treatment) Patient is a 34 y.o. male presenting with alcohol problem. The history is provided by the patient. No language interpreter was used.  Alcohol Problem This is a chronic problem. Pertinent negatives include no chills or fever. Associated symptoms comments: Arrives via EMS for intoxication. He reports he needs help quitting because he can't stop on his own but he is worried that if he goes to a treatment center he will lose shared custody of his son. He denies SI/HI, hallucinations. He has anxiety over the possibility of not being able to see his son. He states he has a psychiatrist in the community that he sees and that his next appointment is next week. .    Past Medical History  Diagnosis Date  . Anxiety   . Depression   . Hypertension   . Asthma   . Alcohol dependence (HCC)   . Pancreatitis    Past Surgical History  Procedure Laterality Date  . No past surgeries     Family History  Problem Relation Age of Onset  . OCD Mother   . Suicidality Neg Hx    Social History  Substance Use Topics  . Smoking status: Former Smoker    Types: Cigarettes    Quit date: 01/31/1999  . Smokeless tobacco: Never Used  . Alcohol Use: Yes     Comment: varies how much    Review of Systems  Constitutional: Negative for fever and chills.  HENT: Negative.   Respiratory: Negative.   Cardiovascular: Negative.   Gastrointestinal: Negative.   Musculoskeletal: Negative.   Skin: Negative.   Neurological: Negative.   Psychiatric/Behavioral: Positive for dysphoric mood. Negative for suicidal ideas. The patient is nervous/anxious.       Allergies  Review of patient's allergies indicates no known allergies.  Home  Medications   Prior to Admission medications   Medication Sig Start Date End Date Taking? Authorizing Provider  acamprosate (CAMPRAL) 333 MG tablet Take 2 tablets (666 mg total) by mouth 3 (three) times daily with meals. 08/26/14   Adonis Brook, NP  chlordiazePOXIDE (LIBRIUM) 25 MG capsule Take 1 capsule (25 mg total) by mouth every 6 (six) hours as needed for withdrawal. 08/26/14   Adonis Brook, NP  hydrOXYzine (ATARAX/VISTARIL) 50 MG tablet Take 1 tablet (50 mg total) by mouth every 6 (six) hours as needed for anxiety. 08/26/14   Adonis Brook, NP  lisinopril (PRINIVIL,ZESTRIL) 5 MG tablet Take 1 tablet (5 mg total) by mouth daily. 08/26/14   Adonis Brook, NP  traZODone (DESYREL) 100 MG tablet Take 1 tablet (100 mg total) by mouth at bedtime as needed for sleep. 08/26/14   Adonis Brook, NP   BP 128/98 mmHg  Pulse 90  Temp(Src) 98.3 F (36.8 C) (Oral)  Resp 14  SpO2 100% Physical Exam  Constitutional: He appears well-developed and well-nourished.  Acutely intoxicated.  HENT:  Head: Normocephalic.  Neck: Normal range of motion. Neck supple.  Cardiovascular: Normal rate and regular rhythm.   Pulmonary/Chest: Effort normal and breath sounds normal.  Abdominal: Soft. Bowel sounds are normal. There is no tenderness. There is no rebound and no guarding.  Musculoskeletal: Normal range of motion.  Neurological: He is alert.  Skin: Skin is warm and dry. No rash  noted.  Psychiatric: He has a normal mood and affect.    ED Course  Procedures (including critical care time) Labs Review Labs Reviewed  URINE RAPID DRUG SCREEN, HOSP PERFORMED    Imaging Review No results found. I have personally reviewed and evaluated these images and lab results as part of my medical decision-making.   EKG Interpretation None      MDM   Final diagnoses:  None    1. Alcohol intoxication 2. Alcohol dependence  The patient has community follow up in place. He denies being suicidal or  homicidal and does not appear to be a threat to himself. He is presently unstable for discharge secondary to alcohol intoxication and will be observed until stable for discharge.     Elpidio AnisShari Avraj Lindroth, PA-C 12/11/14 [id number]  Tomasita CrumbleAdeleke Oni, MD 12/11/14 [phone]

## 2014-12-11 NOTE — ED Notes (Signed)
Pt states he drinks 1/2 gallon vodka daily. Requesting detox--" tired of doing this over and over"

## 2014-12-11 NOTE — ED Notes (Signed)
Patient eating breakfast. °

## 2014-12-11 NOTE — Discharge Instructions (Signed)
Alcohol Intoxication Alcohol intoxication occurs when you drink enough alcohol that it affects your ability to function. It can be mild or very severe. Drinking a lot of alcohol in a short time is called binge drinking. This can be very harmful. Drinking alcohol can also be more dangerous if you are taking medicines or other drugs. Some of the effects caused by alcohol may include:  Loss of coordination.  Changes in mood and behavior.  Unclear thinking.  Trouble talking (slurred speech).  Throwing up (vomiting).  Confusion.  Slowed breathing.  Twitching and shaking (seizures).  Loss of consciousness. HOME CARE  Do not drive after drinking alcohol.  Drink enough water and fluids to keep your pee (urine) clear or pale yellow. Avoid caffeine.  Only take medicine as told by your doctor. GET HELP IF:  You throw up (vomit) many times.  You do not feel better after a few days.  You frequently have alcohol intoxication. Your doctor can help decide if you should see a substance use treatment counselor. GET HELP RIGHT AWAY IF:  You become shaky when you stop drinking.  You have twitching and shaking.  You throw up blood. It may look bright red or like coffee grounds.  You notice blood in your poop (bowel movements).  You become lightheaded or pass out (faint). MAKE SURE YOU:   Understand these instructions.  Will watch your condition.  Will get help right away if you are not doing well or get worse.   This information is not intended to replace advice given to you by your health care provider. Make sure you discuss any questions you have with your health care provider.   Document Released: 06/14/2007 Document Revised: 08/28/2012 Document Reviewed: 05/31/2012 Elsevier Interactive Patient Education 2016 [employer].  Alcohol Abuse and Nutrition Alcohol abuse is any pattern of alcohol consumption that harms your health, relationships, or work. Alcohol abuse can affect  how your body breaks down and absorbs nutrients from food by causing your liver to work abnormally. Additionally, many people who abuse alcohol do not eat enough carbohydrates, protein, fat, vitamins, and minerals. This can cause poor nutrition (malnutrition) and a lack of nutrients (nutrient deficiencies), which can lead to further complications. Nutrients that are commonly lacking (deficient) among people who abuse alcohol include:  Vitamins.  Vitamin A. This is stored in your liver. It is important for your vision, metabolism, and ability to fight off infections (immunity).  B vitamins. These include vitamins such as folate, thiamin, and niacin. These are important in new cell growth and maintenance.  Vitamin C. This plays an important role in iron absorption, wound healing, and immunity.  Vitamin D. This is produced by your liver, but you can also get vitamin D from food. Vitamin D is necessary for your body to absorb and use calcium.  Minerals.  Calcium. This is important for your bones and your heart and blood vessel (cardiovascular) function.  Iron. This is important for blood, muscle, and nervous system functioning.  Magnesium. This plays an important role in muscle and nerve function, and it helps to control blood sugar and blood pressure.  Zinc. This is important for the normal function of your nervous system and digestive system (gastrointestinal tract). Nutrition is an essential component of therapy for alcohol abuse. Your health care provider or dietitian will work with you to design a plan that can help restore nutrients to your body and prevent potential complications. WHAT IS MY PLAN? Your dietitian may develop a specific  diet plan that is based on your condition and any other complications you may have. A diet plan will commonly include:  A balanced diet.  Grains: 6-8 oz per day.  Vegetables: 2-3 cups per day.  Fruits: 1-2 cups per day.  Meat and other protein: 5-6  oz per day.  Dairy: 2-3 cups per day.  Vitamin and mineral supplements. WHAT DO I NEED TO KNOW ABOUT ALCOHOL AND NUTRITION?  Consume foods that are high in antioxidants, such as grapes, berries, nuts, green tea, and dark green and orange vegetables. This can help to counteract some of the stress that is placed on your liver by consuming alcohol.  Avoid food and drinks that are high in fat and sugar. Foods such as sugared soft drinks, salty snack foods, and candy contain empty calories. This means that they lack important nutrients such as protein, fiber, and vitamins.  Eat frequent meals and snacks. Try to eat 5-6 small meals each day.  Eat a variety of fresh fruits and vegetables each day. This will help you get plenty of water, fiber, and vitamins in your diet.  Drink plenty of water and other clear fluids. Try to drink at least 48-64 oz (1.5-2 L) of water per day.  If you are a vegetarian, eat a variety of protein-rich foods. Pair whole grains with plant-based proteins at meals and snacks to obtain the greatest nutrient benefit from your food. For example, eat rice with beans, put peanut butter on whole-grain toast, or eat oatmeal with sunflower seeds.  Soak beans and whole grains overnight before cooking. This can help your body to absorb the nutrients more easily.  Include foods fortified with vitamins and minerals in your diet. Commonly fortified foods include milk, orange juice, cereal, and bread.  If you are malnourished, your dietitian may recommend a high-protein, high-calorie diet. This may include:  2,000-3,000 calories (kilocalories) per day.  70-100 grams of protein per day.  Your health care provider may recommend a complete nutritional supplement beverage. This can help to restore calories, protein, and vitamins to your body. Depending on your condition, you may be advised to consume this instead of or in addition to meals.  Limit your intake of caffeine. Replace drinks  like coffee and black tea with decaffeinated coffee and herbal tea.  Eat a variety of foods that are high in omega fatty acids. These include fish, nuts and seeds, and soybeans. These foods may help your liver to recover and may also stabilize your mood.  Certain medicines may cause changes in your appetite, taste, and weight. Work with your health care provider and dietitian to make any adjustments to your medicines and diet plan.  Include other healthy lifestyle choices in your daily routine.  Be physically active.  Get enough sleep.  Spend time doing activities that you enjoy.  If you are unable to take in enough food and calories by mouth, your health care provider may recommend a feeding tube. This is a tube that passes through your nose and throat, directly into your stomach. Nutritional supplement beverages can be given to you through the feeding tube to help you get the nutrients you need.  Take vitamin or mineral supplements as recommended by your health care provider. WHAT FOODS CAN I EAT? Grains Enriched pasta. Enriched rice. Fortified whole-grain bread. Fortified whole-grain cereal. Barley. Brown rice. Quinoa. [place]. Vegetables All fresh, frozen, and canned vegetables. Spinach. Kale. Artichoke. Carrots. Winter squash and pumpkin. Sweet potatoes. Broccoli. Cabbage. Cucumbers. Tomatoes. Sweet peppers.  Green beans. Peas. Corn. Fruits All fresh and frozen fruits. Berries. Grapes. Mango. Papaya. Guava. Cherries. Apples. Bananas. Peaches. Plums. Pineapple. Watermelon. Cantaloupe. Oranges. Avocado. Meats and Other Protein Sources Beef liver. Lean beef. Pork. Fresh and canned chicken. Fresh fish. Oysters. Sardines. Canned tuna. Shrimp. Eggs with yolks. Nuts and seeds. Peanut butter. Beans and lentils. Soybeans. Tofu. Dairy Whole, low-fat, and nonfat milk. Whole, low-fat, and nonfat yogurt. Cottage cheese. Sour cream. Hard and soft cheeses. Beverages Water. Herbal tea. Decaffeinated  coffee. Decaffeinated green tea. 100% fruit juice. 100% vegetable juice. Instant breakfast shakes. Condiments Ketchup. Mayonnaise. Mustard. Salad dressing. Barbecue sauce. Sweets and Desserts Sugar-free ice cream. Sugar-free pudding. Sugar-free gelatin. Fats and Oils Butter. Vegetable oil, flaxseed oil, olive oil, and walnut oil. Other Complete nutrition shakes. Protein bars. Sugar-free gum. The items listed above may not be a complete list of recommended foods or beverages. Contact your dietitian for more options. WHAT FOODS ARE NOT RECOMMENDED? Grains Sugar-sweetened breakfast cereals. Flavored instant oatmeal. Fried breads. Vegetables Breaded or deep-fried vegetables. Fruits Dried fruit with added sugar. Candied fruit. Canned fruit in syrup. Meats and Other Protein Sources Breaded or deep-fried meats. Dairy Flavored milks. Fried cheese curds or fried cheese sticks. Beverages Alcohol. Sugar-sweetened soft drinks. Sugar-sweetened tea. Caffeinated coffee and tea. Condiments Sugar. Honey. Agave nectar. Molasses. Sweets and Desserts Chocolate. Cake. Cookies. Candy. Other Potato chips. Pretzels. Salted nuts. Candied nuts. The items listed above may not be a complete list of foods and beverages to avoid. Contact your dietitian for more information.   This information is not intended to replace advice given to you by your health care provider. Make sure you discuss any questions you have with your health care provider.   Document Released: 10/20/2004 Document Revised: 01/16/2014 Document Reviewed: 07/29/2013 Elsevier Interactive Patient Education 2016 [employer].  Alcohol Use Disorder Alcohol use disorder is a mental disorder. It is not a one-time incident of heavy drinking. Alcohol use disorder is the excessive and uncontrollable use of alcohol over time that leads to problems with functioning in one or more areas of daily living. People with this disorder risk harming themselves  and others when they drink to excess. Alcohol use disorder also can cause other mental disorders, such as mood and anxiety disorders, and serious physical problems. People with alcohol use disorder often misuse other drugs.  Alcohol use disorder is common and widespread. Some people with this disorder drink alcohol to cope with or escape from negative life events. Others drink to relieve chronic pain or symptoms of mental illness. People with a family history of alcohol use disorder are at higher risk of losing control and using alcohol to excess.  Drinking too much alcohol can cause injury, accidents, and health problems. One drink can be too much when you are:  Working.  Pregnant or breastfeeding.  Taking medicines. Ask your doctor.  Driving or planning to drive. SYMPTOMS  Signs and symptoms of alcohol use disorder may include the following:   Consumption ofalcohol inlarger amounts or over a longer period of time than intended.  Multiple unsuccessful attempts to cutdown or control alcohol use.   A great deal of time spent obtaining alcohol, using alcohol, or recovering from the effects of alcohol (hangover).  A strong desire or urge to use alcohol (cravings).   Continued use of alcohol despite problems at work, school, or home because of alcohol use.   Continued use of alcohol despite problems in relationships because of alcohol use.  Continued use of alcohol in  situations when it is physically hazardous, such as driving a car.  Continued use of alcohol despite awareness of a physical or psychological problem that is likely related to alcohol use. Physical problems related to alcohol use can involve the brain, heart, liver, stomach, and intestines. Psychological problems related to alcohol use include intoxication, depression, anxiety, psychosis, delirium, and dementia.   The need for increased amounts of alcohol to achieve the same desired effect, or a decreased effect from  the consumption of the same amount of alcohol (tolerance).  Withdrawal symptoms upon reducing or stopping alcohol use, or alcohol use to reduce or avoid withdrawal symptoms. Withdrawal symptoms include:  Racing heart.  Hand tremor.  Difficulty sleeping.  Nausea.  Vomiting.  Hallucinations.  Restlessness.  Seizures. DIAGNOSIS Alcohol use disorder is diagnosed through an assessment by your health care provider. Your health care provider may start by asking three or four questions to screen for excessive or problematic alcohol use. To confirm a diagnosis of alcohol use disorder, at least two symptoms must be present within a 12-month period. The severity of alcohol use disorder depends on the number of symptoms:  Mild--two or three.  Moderate--four or five.  Severe--six or more. Your health care provider may perform a physical exam or use results from lab tests to see if you have physical problems resulting from alcohol use. Your health care provider may refer you to a mental health professional for evaluation. TREATMENT  Some people with alcohol use disorder are able to reduce their alcohol use to low-risk levels. Some people with alcohol use disorder need to quit drinking alcohol. When necessary, mental health professionals with specialized training in substance use treatment can help. Your health care provider can help you decide how severe your alcohol use disorder is and what type of treatment you need. The following forms of treatment are available:   Detoxification. Detoxification involves the use of prescription medicines to prevent alcohol withdrawal symptoms in the first week after quitting. This is important for people with a history of symptoms of withdrawal and for heavy drinkers who are likely to have withdrawal symptoms. Alcohol withdrawal can be dangerous and, in severe cases, cause death. Detoxification is usually provided in a hospital or in-patient substance use  treatment facility.  Counseling or talk therapy. Talk therapy is provided by substance use treatment counselors. It addresses the reasons people use alcohol and ways to keep them from drinking again. The goals of talk therapy are to help people with alcohol use disorder find healthy activities and ways to cope with life stress, to identify and avoid triggers for alcohol use, and to handle cravings, which can cause relapse.  Medicines.Different medicines can help treat alcohol use disorder through the following actions:  Decrease alcohol cravings.  Decrease the positive reward response felt from alcohol use.  Produce an uncomfortable physical reaction when alcohol is used (aversion therapy).  Support groups. Support groups are run by people who have quit drinking. They provide emotional support, advice, and guidance. These forms of treatment are often combined. Some people with alcohol use disorder benefit from intensive combination treatment provided by specialized substance use treatment centers. Both inpatient and outpatient treatment programs are available.   This information is not intended to replace advice given to you by your health care provider. Make sure you discuss any questions you have with your health care provider.   Follow-up with outpatient treatment facility. Avoid alcohol use. Take Librium for withdrawal. Return to the emergency department if you  experience significant tremor, seizure, vomiting, confusion, loss of consciousness, difficulty breathing.

## 2014-12-11 NOTE — Progress Notes (Signed)
CSW received request to see this patient with active alcohol use. Patient came into ED via EMS and was heavily intoxicated at time of admission.  CSW spoke with patient in his ED room this morning to assess and assist with resources as needed.  SBIRT completed.  Patient reports drinking 1/2 gallon of vodka daily for past week.  Patient reports repeat episodes of heavy drinking with difficulty stopping use.  Patient states has been in residential treatment in the past (last treatment contact for residential in 2013). Now seen by psychiatry at [facility].  Patient states wanting to begin outpatient treatment for substance abuse. States has shared custody of son and worried about losing custody of son if he enters residential care.  Shared both outpatient and residential treatment lists with patient. Patient states [facility]. Patient aware that he will need to schedule appointment to establish care. No further needs expressed.  Gerrie NordmannMichelle Barrett-Hilton, LCSW [phone]

## 2014-12-11 NOTE — ED Notes (Signed)
PA at bedside.

## 2014-12-11 NOTE — ED Notes (Signed)
Pt desated to 89% while sleeping, placed on 2L O2 via [place]

## 2014-12-11 NOTE — ED Notes (Signed)
Spoke with PA about possible D/C patient able to ambulate without assistance to the restroom.

## 2014-12-11 NOTE — ED Notes (Signed)
Assisted patient to bathroom, pt did not collect specimen

## 2014-12-11 NOTE — ED Notes (Signed)
Pt ambulated in a steady manner to the restroom 

## 2014-12-22 ENCOUNTER — Emergency Department (HOSPITAL_COMMUNITY)
Admission: EM | Admit: 2014-12-22 | Discharge: 2014-12-23 | Disposition: A | Discharge status: Other Acute Inpatient Hospital | Payer: No Typology Code available for payment source | Attending: Emergency Medicine | Admitting: Emergency Medicine

## 2014-12-22 ENCOUNTER — Encounter (HOSPITAL_COMMUNITY): Payer: Self-pay

## 2014-12-22 DIAGNOSIS — F10120 Alcohol abuse with intoxication, uncomplicated: Secondary | ICD-10-CM | POA: Insufficient documentation

## 2014-12-22 DIAGNOSIS — R45851 Suicidal ideations: Secondary | ICD-10-CM

## 2014-12-22 DIAGNOSIS — Z87891 Personal history of nicotine dependence: Secondary | ICD-10-CM | POA: Insufficient documentation

## 2014-12-22 DIAGNOSIS — J45909 Unspecified asthma, uncomplicated: Secondary | ICD-10-CM | POA: Diagnosis not present

## 2014-12-22 DIAGNOSIS — F419 Anxiety disorder, unspecified: Secondary | ICD-10-CM | POA: Diagnosis not present

## 2014-12-22 DIAGNOSIS — I10 Essential (primary) hypertension: Secondary | ICD-10-CM | POA: Diagnosis not present

## 2014-12-22 DIAGNOSIS — F1310 Sedative, hypnotic or anxiolytic abuse, uncomplicated: Secondary | ICD-10-CM | POA: Diagnosis not present

## 2014-12-22 DIAGNOSIS — F329 Major depressive disorder, single episode, unspecified: Secondary | ICD-10-CM | POA: Diagnosis not present

## 2014-12-22 DIAGNOSIS — Z8719 Personal history of other diseases of the digestive system: Secondary | ICD-10-CM | POA: Diagnosis not present

## 2014-12-22 DIAGNOSIS — F10920 Alcohol use, unspecified with intoxication, uncomplicated: Secondary | ICD-10-CM

## 2014-12-22 LAB — COMPREHENSIVE METABOLIC PANEL
ALK PHOS: 82 U/L (ref 38–126)
ALT: 253 U/L — ABNORMAL HIGH (ref 17–63)
AST: 130 U/L — AB (ref 15–41)
Albumin: 3.6 g/dL (ref 3.5–5.0)
Anion gap: 10 (ref 5–15)
BILIRUBIN TOTAL: 0.8 mg/dL (ref 0.3–1.2)
BUN: 9 mg/dL (ref 6–20)
CALCIUM: 8.8 mg/dL — AB (ref 8.9–10.3)
CO2: 26 mmol/L (ref 22–32)
Chloride: 109 mmol/L (ref 101–111)
Creatinine, Ser: 0.87 mg/dL (ref 0.61–1.24)
GFR calc Af Amer: >60 mL/min (ref >60)
GLUCOSE: 145 mg/dL — AB (ref 65–99)
POTASSIUM: 3.6 mmol/L (ref 3.5–5.1)
Sodium: 145 mmol/L (ref 135–145)
TOTAL PROTEIN: 6.6 g/dL (ref 6.5–8.1)

## 2014-12-22 LAB — RAPID URINE DRUG SCREEN, HOSP PERFORMED
Amphetamines: NOT DETECTED
BARBITURATES: NOT DETECTED
BENZODIAZEPINES: POSITIVE — AB
Cocaine: NOT DETECTED
Opiates: NOT DETECTED
Tetrahydrocannabinol: NOT DETECTED

## 2014-12-22 LAB — CBC
HEMATOCRIT: 40.6 % (ref 39.0–52.0)
Hemoglobin: 13.8 g/dL (ref 13.0–17.0)
MCH: 32.3 pg (ref 26.0–34.0)
MCHC: 34 g/dL (ref 30.0–36.0)
MCV: 95.1 fL (ref 78.0–100.0)
PLATELETS: 173 10*3/uL (ref 150–400)
RBC: 4.27 MIL/uL (ref 4.22–5.81)
RDW: 16.1 % — AB (ref 11.5–15.5)
WBC: 6.4 10*3/uL (ref 4.0–10.5)

## 2014-12-22 LAB — ETHANOL: ALCOHOL ETHYL (B): 360 mg/dL — AB (ref <5)

## 2014-12-22 LAB — ACETAMINOPHEN LEVEL: Acetaminophen (Tylenol), Serum: <10 ug/mL — ABNORMAL LOW (ref 10–30)

## 2014-12-22 LAB — SALICYLATE LEVEL: Salicylate Lvl: <4 mg/dL (ref 2.8–30.0)

## 2014-12-22 MED ORDER — LORAZEPAM 1 MG PO TABS
0.0000 mg | ORAL_TABLET | Freq: Four times a day (QID) | ORAL | Status: DC
  Filled 2014-12-22: qty 1

## 2014-12-22 MED ORDER — VITAMIN B-1 100 MG PO TABS
100.0000 mg | ORAL_TABLET | Freq: Every day | ORAL | Status: DC
  Administered 2014-12-23: 100 mg via ORAL
  Filled 2014-12-22: qty 1

## 2014-12-22 MED ORDER — THIAMINE HCL 100 MG/ML IJ SOLN: 100.0000 mg | Freq: Every day | INTRAMUSCULAR | Status: DC

## 2014-12-22 NOTE — ED Notes (Signed)
Notified house coverage for sitter, patient already wearing purple scrubs. Tanya, emt, sitting at this time. Security called for wanding.

## 2014-12-22 NOTE — ED Notes (Addendum)
Gave pt Malaw[place]urkey sandwich, applesauce, graham crackers and Ginger Ale, per Eme - RN

## 2014-12-22 NOTE — ED Provider Notes (Signed)
CSN: [id number]     Arrival date & time 12/22/14  2111 History   First MD Initiated Contact with Patient 12/22/14 2140     Chief Complaint  Patient presents with  . Alcohol Problem  . Suicidal    Patient is a 34 y.o. male presenting with alcohol problem. The history is provided by the patient.  Alcohol Problem This is a chronic problem. Episode onset: 3 days ago. The problem occurs constantly. The problem has been unchanged. Pertinent negatives include no abdominal pain, chest pain, chills, coughing, diaphoresis, fever, headaches, nausea, neck pain, rash, sore throat, vertigo or vomiting. Nothing aggravates the symptoms. He has tried nothing for the symptoms.    Past Medical History  Diagnosis Date  . Anxiety   . Depression   . Hypertension   . Asthma   . Alcohol dependence (HCC)   . Pancreatitis    Past Surgical History  Procedure Laterality Date  . No past surgeries     Family History  Problem Relation Age of Onset  . OCD Mother   . Suicidality Neg Hx    Social History  Substance Use Topics  . Smoking status: Former Smoker    Types: Cigarettes    Quit date: 01/31/1999  . Smokeless tobacco: Never Used  . Alcohol Use: Yes     Comment: varies how much    Review of Systems  Constitutional: Negative for fever, chills and diaphoresis.  HENT: Negative for rhinorrhea and sore throat.   Eyes: Negative for visual disturbance.  Respiratory: Negative for cough and shortness of breath.   Cardiovascular: Negative for chest pain.  Gastrointestinal: Negative for nausea, vomiting, abdominal pain, diarrhea and constipation.  Genitourinary: Negative for dysuria and hematuria.  Musculoskeletal: Negative for back pain and neck pain.  Skin: Negative for rash.  Neurological: Negative for vertigo, syncope and headaches.  Psychiatric/Behavioral: Positive for suicidal ideas. Negative for confusion and self-injury.  All other systems reviewed and are negative.  Allergies  Review of  patient's allergies indicates no known allergies.  Home Medications   Prior to Admission medications   Medication Sig Start Date End Date Taking? Authorizing Provider  acamprosate (CAMPRAL) 333 MG tablet Take 2 tablets (666 mg total) by mouth 3 (three) times daily with meals. Patient not taking: Reported on 12/22/2014 08/26/14   Adonis BrookSheila Agustin, NP  hydrOXYzine (ATARAX/VISTARIL) 50 MG tablet Take 1 tablet (50 mg total) by mouth every 6 (six) hours as needed for anxiety. Patient not taking: Reported on 12/22/2014 08/26/14   Adonis BrookSheila Agustin, NP  lisinopril (PRINIVIL,ZESTRIL) 5 MG tablet Take 1 tablet (5 mg total) by mouth daily. Patient not taking: Reported on 12/22/2014 08/26/14   Adonis BrookSheila Agustin, NP  traZODone (DESYREL) 100 MG tablet Take 1 tablet (100 mg total) by mouth at bedtime as needed for sleep. Patient not taking: Reported on 12/22/2014 08/26/14   Adonis BrookSheila Agustin, NP   BP 141/93 mmHg  Pulse 114  Temp(Src) 98.6 F (37 C) (Oral)  Resp 20  SpO2 96% Physical Exam  Constitutional: He is oriented to person, place, and time. He appears well-developed and well-nourished. No distress.  Tearful, slurred speech  HENT:  Head: Normocephalic and atraumatic.  Mouth/Throat: Oropharynx is clear and moist.  Eyes: EOM are normal.  Neck: Neck supple. No JVD present.  Cardiovascular: Normal rate, regular rhythm, normal heart sounds and intact distal pulses.   Pulmonary/Chest: Effort normal and breath sounds normal.  Abdominal: Soft. He exhibits no distension. There is no tenderness.  Musculoskeletal: Normal range  of motion. He exhibits no edema.  Neurological: He is alert and oriented to person, place, and time. No cranial nerve deficit.  Skin: Skin is warm and dry.  Psychiatric: His behavior is normal.    ED Course  Procedures  None   Labs Review Labs Reviewed  COMPREHENSIVE METABOLIC PANEL - Abnormal; Notable for the following:    Glucose, Bld 145 (*)    Calcium 8.8 (*)    AST 130 (*)     ALT 253 (*)    All other components within normal limits  ETHANOL - Abnormal; Notable for the following:    Alcohol, Ethyl (B) 360 (*)    All other components within normal limits  ACETAMINOPHEN LEVEL - Abnormal; Notable for the following:    Acetaminophen (Tylenol), Serum <10 (*)    All other components within normal limits  CBC - Abnormal; Notable for the following:    RDW 16.1 (*)    All other components within normal limits  URINE RAPID DRUG SCREEN, HOSP PERFORMED - Abnormal; Notable for the following:    Benzodiazepines POSITIVE (*)    All other components within normal limits  SALICYLATE LEVEL    MDM   Final diagnoses:  Alcohol intoxication, uncomplicated (HCC)  Suicidal ideations   Pt is a 34 yo m with a PMH of anxiety, depression, and chronic alcohol abuse who presents with alcohol intoxication seeking detox as well as suicidal ideations. No active plan. No HI or auditory or visual hallucinations. Tearful. ETOH 360. Will need to metabolize and be reassessed with Psych consultation if still suicidal. Placed on CIWA. Sitter at bedside. Care assumed by Dr. Corlis Leak at midnight.  Discussed with Dr. Jeraldine Loots.    Maris Berger, MD 12/22/14 1610  Gerhard Munch, MD 12/22/14 [phone]

## 2014-12-22 NOTE — ED Notes (Signed)
Pt reports he came here tonight to get help with his alcohol problem. He reports he drinks anywhere from "1/4 of a gallon to 1/2 of a gallon of vodka" a day. He states he drank "6-8 drinks of vodka" today. His psychiatrist encouraged him to come here for alcohol detox because she wants to put him on some medications for alcoholism but wants him to get sober before she prescribes the medications. Pt does report suicidal thoughts onset 2 hours ago after receiving text messages from his parents. He does not report a plan to harm himself. Pt denies illegal drug use.

## 2014-12-22 NOTE — ED Notes (Signed)
Security present for wanding patient.

## 2014-12-23 ENCOUNTER — Encounter (HOSPITAL_COMMUNITY): Payer: Self-pay

## 2014-12-23 ENCOUNTER — Inpatient Hospital Stay (HOSPITAL_COMMUNITY)
Admission: AD | Admit: 2014-12-23 | Discharge: 2014-12-25 | DRG: 897 | Disposition: A | Discharge status: Home / Self Care | Payer: No Typology Code available for payment source | Source: Intra-hospital | Attending: Psychiatry | Admitting: Psychiatry

## 2014-12-23 DIAGNOSIS — G4700 Insomnia, unspecified: Secondary | ICD-10-CM | POA: Diagnosis present

## 2014-12-23 DIAGNOSIS — I10 Essential (primary) hypertension: Secondary | ICD-10-CM | POA: Diagnosis present

## 2014-12-23 DIAGNOSIS — F10239 Alcohol dependence with withdrawal, unspecified: Principal | ICD-10-CM | POA: Diagnosis present

## 2014-12-23 DIAGNOSIS — F331 Major depressive disorder, recurrent, moderate: Secondary | ICD-10-CM | POA: Diagnosis present

## 2014-12-23 DIAGNOSIS — F1020 Alcohol dependence, uncomplicated: Secondary | ICD-10-CM | POA: Diagnosis present

## 2014-12-23 DIAGNOSIS — F1010 Alcohol abuse, uncomplicated: Secondary | ICD-10-CM | POA: Diagnosis present

## 2014-12-23 DIAGNOSIS — Z87891 Personal history of nicotine dependence: Secondary | ICD-10-CM | POA: Diagnosis not present

## 2014-12-23 DIAGNOSIS — F419 Anxiety disorder, unspecified: Secondary | ICD-10-CM | POA: Diagnosis not present

## 2014-12-23 MED ORDER — ALBUTEROL SULFATE HFA 108 (90 BASE) MCG/ACT IN AERS
2.0000 | INHALATION_SPRAY | Freq: Four times a day (QID) | RESPIRATORY_TRACT | Status: DC | PRN
  Administered 2014-12-23 – 2014-12-24 (×4): 2 via RESPIRATORY_TRACT

## 2014-12-23 MED ORDER — TRAZODONE HCL 50 MG PO TABS
ORAL_TABLET | ORAL | Status: AC
  Filled 2014-12-23: qty 1

## 2014-12-23 MED ORDER — ACETAMINOPHEN 325 MG PO TABS: 650.0000 mg | ORAL_TABLET | Freq: Four times a day (QID) | ORAL | Status: DC | PRN

## 2014-12-23 MED ORDER — LORAZEPAM 1 MG PO TABS
1.0000 mg | ORAL_TABLET | Freq: Three times a day (TID) | ORAL | Status: AC
  Administered 2014-12-24 (×3): 1 mg via ORAL
  Filled 2014-12-23 (×3): qty 1

## 2014-12-23 MED ORDER — ACAMPROSATE CALCIUM 333 MG PO TBEC: 666.0000 mg | DELAYED_RELEASE_TABLET | Freq: Three times a day (TID) | ORAL | Status: DC

## 2014-12-23 MED ORDER — LORAZEPAM 1 MG PO TABS
1.0000 mg | ORAL_TABLET | Freq: Four times a day (QID) | ORAL | Status: AC
  Administered 2014-12-23 (×3): 1 mg via ORAL
  Filled 2014-12-23 (×3): qty 1

## 2014-12-23 MED ORDER — ADULT MULTIVITAMIN W/MINERALS CH
1.0000 | ORAL_TABLET | Freq: Every day | ORAL | Status: DC
  Administered 2014-12-23 – 2014-12-25 (×3): 1 via ORAL
  Filled 2014-12-23 (×5): qty 1

## 2014-12-23 MED ORDER — LORAZEPAM 1 MG PO TABS: 1.0000 mg | ORAL_TABLET | Freq: Every day | ORAL | Status: DC

## 2014-12-23 MED ORDER — LISINOPRIL 10 MG PO TABS
5.0000 mg | ORAL_TABLET | Freq: Every day | ORAL | Status: DC
  Administered 2014-12-23: 5 mg via ORAL
  Filled 2014-12-23: qty 1

## 2014-12-23 MED ORDER — HYDROXYZINE HCL 25 MG PO TABS: 50.0000 mg | ORAL_TABLET | Freq: Four times a day (QID) | ORAL | Status: DC | PRN

## 2014-12-23 MED ORDER — VITAMIN B-1 100 MG PO TABS
100.0000 mg | ORAL_TABLET | Freq: Every day | ORAL | Status: DC
  Administered 2014-12-24 – 2014-12-25 (×2): 100 mg via ORAL
  Filled 2014-12-23 (×3): qty 1

## 2014-12-23 MED ORDER — ALUM & MAG HYDROXIDE-SIMETH 200-200-20 MG/5ML PO SUSP: 30.0000 mL | ORAL | Status: DC | PRN

## 2014-12-23 MED ORDER — LOPERAMIDE HCL 2 MG PO CAPS: 2.0000 mg | ORAL_CAPSULE | ORAL | Status: DC | PRN

## 2014-12-23 MED ORDER — ONDANSETRON 4 MG PO TBDP: 4.0000 mg | ORAL_TABLET | Freq: Four times a day (QID) | ORAL | Status: DC | PRN

## 2014-12-23 MED ORDER — ALBUTEROL SULFATE HFA 108 (90 BASE) MCG/ACT IN AERS
INHALATION_SPRAY | RESPIRATORY_TRACT | Status: AC
  Filled 2014-12-23: qty 6.7

## 2014-12-23 MED ORDER — LORAZEPAM 1 MG PO TABS
1.0000 mg | ORAL_TABLET | Freq: Four times a day (QID) | ORAL | Status: DC | PRN
  Administered 2014-12-24: 1 mg via ORAL
  Filled 2014-12-23: qty 1

## 2014-12-23 MED ORDER — MAGNESIUM HYDROXIDE 400 MG/5ML PO SUSP: 30.0000 mL | Freq: Every day | ORAL | Status: DC | PRN

## 2014-12-23 MED ORDER — LORAZEPAM 1 MG PO TABS
1.0000 mg | ORAL_TABLET | Freq: Two times a day (BID) | ORAL | Status: DC
  Administered 2014-12-25: 1 mg via ORAL
  Filled 2014-12-23: qty 1

## 2014-12-23 MED ORDER — HYDROXYZINE HCL 25 MG PO TABS
25.0000 mg | ORAL_TABLET | Freq: Four times a day (QID) | ORAL | Status: DC | PRN
  Administered 2014-12-23 – 2014-12-25 (×7): 25 mg via ORAL
  Filled 2014-12-23 (×7): qty 1

## 2014-12-23 MED ORDER — LISINOPRIL 5 MG PO TABS
5.0000 mg | ORAL_TABLET | Freq: Every day | ORAL | Status: DC
Start: 2014-12-23 — End: 2014-12-25
  Administered 2014-12-23 – 2014-12-25 (×3): 5 mg via ORAL
  Filled 2014-12-23 (×5): qty 1

## 2014-12-23 MED ORDER — TRAZODONE HCL 100 MG PO TABS
100.0000 mg | ORAL_TABLET | Freq: Every evening | ORAL | Status: DC | PRN
  Administered 2014-12-23: 100 mg via ORAL

## 2014-12-23 MED ORDER — TRAZODONE HCL 100 MG PO TABS: 100.0000 mg | ORAL_TABLET | Freq: Every evening | ORAL | Status: DC | PRN

## 2014-12-23 MED ORDER — THIAMINE HCL 100 MG/ML IJ SOLN
100.0000 mg | Freq: Once | INTRAMUSCULAR | Status: AC
  Administered 2014-12-23: 100 mg via INTRAMUSCULAR
  Filled 2014-12-23: qty 2

## 2014-12-23 MED ORDER — LORAZEPAM 1 MG PO TABS: 0.0000 mg | ORAL_TABLET | Freq: Four times a day (QID) | ORAL | Status: DC

## 2014-12-23 MED ORDER — LORAZEPAM 1 MG PO TABS: 0.0000 mg | ORAL_TABLET | Freq: Two times a day (BID) | ORAL | Status: DC

## 2014-12-23 NOTE — ED Notes (Signed)
[place] [facility] called. 

## 2014-12-23 NOTE — Progress Notes (Signed)
Pt was encouraged but did not attend evening AA group.  

## 2014-12-23 NOTE — H&P (Signed)
Psychiatric Admission Assessment Adult  Patient Identification: Ian [name] MRN:  [id number] Date of Evaluation:  12/23/2014 Chief Complaint:  ALCOHOL USE DISORDER SEVERE MDD RECURRENT EPISODE UNSPECIFIED Principal Diagnosis: <principal problem not specified> Diagnosis:   Patient Active Problem List   Diagnosis Date Noted  . Alcohol abuse [F10.10] 12/23/2014  . Alcohol use disorder, severe, dependence ([place]) [F10.20] 08/23/2014  . Substance induced mood disorder ([place]) [F19.94] 08/23/2014  . Suicidal ideation [R45.851] 08/23/2014  . Pancreatitis [K85.9] 01/30/2014  . Acute pancreatitis [K85.90] 01/30/2014  . Abdominal pain [R10.9] 01/30/2014  . Nausea and vomiting [R11.2] 01/30/2014  . Hyponatremia [E87.1] 01/30/2014  . Leukocytosis [D72.829]   . Nicotine abuse [F19.10] 08/14/2013  . GAD (generalized anxiety disorder) [F41.1] 08/14/2013  . Major depressive disorder, recurrent episode, moderate ([place]) [F33.1] 08/14/2013  . ABDOMINAL PAIN OTHER SPECIFIED SITE [R10.9] 08/05/2009  . CHEST PAIN [R07.9] 01/19/2009  . CERUMEN IMPACTION [H61.20] 07/31/2008  . ASTHMA [J45.909] 07/31/2008   History of Present Illness:: 34 Y/o male who left the unit in August 17. He went home and back to work. Started drinking shortly after he left. States he drinks a fifth to half a gallon almost every day except when he has his son. He states he saw his therapist who recommended that he should get on the Vivitrol. She also recommended that he gets inpatient detox Associated Signs/Symptoms: Depression Symptoms:  depressed mood, anhedonia, insomnia, fatigue, feelings of worthlessness/guilt, difficulty concentrating, anxiety, panic attacks, loss of energy/fatigue, disturbed sleep, weight loss, (Hypo) Manic Symptoms:  Labiality of Mood, Anxiety Symptoms:  Excessive Worry, Panic Symptoms, Psychotic Symptoms:  Hallucinations: Auditory Paranoia, associated to his alcohol use PTSD  Symptoms: Negative Total Time spent with patient: 45 minutes  Past Psychiatric History:   Risk to Self: Is patient at risk for suicide?: Yes Risk to Others:  No Prior Inpatient Therapy:  [place], [facility] Prior Outpatient Therapy:  [facility]  Alcohol Screening: 1. How often do you have a drink containing alcohol?: 4 or more times a week 2. How many drinks containing alcohol do you have on a typical day when you are drinking?: 10 or more 3. How often do you have six or more drinks on one occasion?: Weekly Preliminary Score: 7 4. How often during the last year have you found that you were not able to stop drinking once you had started?: Weekly 5. How often during the last year have you failed to do what was normally expected from you becasue of drinking?: Weekly 6. How often during the last year have you needed a first drink in the morning to get yourself going after a heavy drinking session?: Weekly 7. How often during the last year have you had a feeling of guilt of remorse after drinking?: Weekly 8. How often during the last year have you been unable to remember what happened the night before because you had been drinking?: Weekly 9. Have you or someone else been injured as a result of your drinking?: Yes, but not in the last year 10. Has a relative or friend or a doctor or another health worker been concerned about your drinking or suggested you cut down?: Yes, but not in the last year Alcohol Use Disorder Identification Test Final Score (AUDIT): 30 Brief Intervention: Yes Substance Abuse History in the last 12 months:  Yes.   Consequences of Substance Abuse: 2 DWI, Withdrawal: tremors sweats nausea Blackouts  Previous Psychotropic Medications: Yes Vistaril Trazodone Xanax 0.25 mg Psychological Evaluations: No  Past Medical History:  Past Medical History  Diagnosis Date  . Anxiety   . Depression   . Hypertension   . Asthma   . Alcohol  dependence ([place])   . Pancreatitis     Past Surgical History  Procedure Laterality Date  . No past surgeries     Family History:  Family History  Problem Relation Age of Onset  . OCD Mother   . Suicidality Neg Hx    Family Psychiatric  History: Mother: Depression Anxiety  Social History:  History  Alcohol Use  . Yes    Comment: varies how much     History  Drug Use No    Social History   Social History  . Marital Status: Single    Spouse Name: N/A  . Number of Children: N/A  . Years of Education: N/A   Social History Main Topics  . Smoking status: Former Smoker    Types: Cigarettes    Quit date: 01/31/1999  . Smokeless tobacco: Never Used  . Alcohol Use: Yes     Comment: varies how much  . Drug Use: No  . Sexual Activity: Yes    Birth Control/ Protection: Condom   Other Topics Concern  . 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 [place] has done several jobs single has a 10 Y/O son (shrared custody)  Additional Social History:    History of alcohol / drug use?: Yes Longest period of sobriety (when/how long): 13-14 months in 2013 Negative Consequences of Use: Financial, Personal relationships Withdrawal Symptoms: Nausea / Vomiting Name of Substance 1: alcohol 1 - Age of First Use: at or around age 89 1 - Amount (size/oz): 1.5 gallons of vodka 1 - Frequency: daily 1 - Duration: years 1 - Last Use / Amount: 12/22/14 1/4 gallon vodka                  Allergies:  No Known Allergies Lab Results:  Results for orders placed or performed during the hospital encounter of 12/22/14 (from the past 48 hour(s))  Urine rapid drug screen (hosp performed) (Not at [facility])     Status: Abnormal   Collection Time: 12/22/14  9:31 PM  Result Value Ref Range   Opiates NONE DETECTED NONE DETECTED   Cocaine NONE DETECTED NONE DETECTED   Benzodiazepines POSITIVE (A) NONE DETECTED   Amphetamines NONE  DETECTED NONE DETECTED   Tetrahydrocannabinol NONE DETECTED NONE DETECTED   Barbiturates NONE DETECTED NONE DETECTED    Comment:        DRUG SCREEN FOR MEDICAL PURPOSES ONLY.  IF CONFIRMATION IS NEEDED FOR ANY PURPOSE, NOTIFY LAB WITHIN 5 DAYS.        LOWEST DETECTABLE LIMITS FOR URINE DRUG SCREEN Drug Class       Cutoff (ng/mL) Amphetamine      1000 Barbiturate      200 Benzodiazepine   500 Tricyclics       938 Opiates          300 Cocaine          300 THC              50   Comprehensive metabolic panel     Status: Abnormal   Collection Time: 12/22/14  9:48 PM  Result Value Ref Range   Sodium 145 135 - 145 mmol/L   Potassium 3.6 3.5 - 5.1 mmol/L   Chloride 109 101 - 111  mmol/L   CO2 26 22 - 32 mmol/L   Glucose, Bld 145 (H) 65 - 99 mg/dL   BUN 9 6 - 20 mg/dL   Creatinine, Ser 7.63 0.61 - 1.24 mg/dL   Calcium 8.8 (L) 8.9 - 10.3 mg/dL   Total Protein 6.6 6.5 - 8.1 g/dL   Albumin 3.6 3.5 - 5.0 g/dL   AST 939 (H) 15 - 41 U/L   ALT 253 (H) 17 - 63 U/L   Alkaline Phosphatase 82 38 - 126 U/L   Total Bilirubin 0.8 0.3 - 1.2 mg/dL   GFR calc non Af Amer >60 >60 mL/min   GFR calc Af Amer >60 >60 mL/min    Comment: (NOTE) The eGFR has been calculated using the CKD EPI equation. This calculation has not been validated in all clinical situations. eGFR's persistently <60 mL/min signify possible Chronic Kidney Disease.    Anion gap 10 5 - 15  Ethanol (ETOH)     Status: Abnormal   Collection Time: 12/22/14  9:48 PM  Result Value Ref Range   Alcohol, Ethyl (B) 360 (HH) <5 mg/dL    Comment:        LOWEST DETECTABLE LIMIT FOR SERUM ALCOHOL IS 5 mg/dL FOR MEDICAL PURPOSES ONLY CRITICAL RESULT CALLED TO, READ BACK BY AND VERIFIED WITH: [facility] E,RN 12/22/14 2222 WAYK   Salicylate level     Status: None   Collection Time: 12/22/14  9:48 PM  Result Value Ref Range   Salicylate Lvl <4.0 2.8 - 30.0 mg/dL  Acetaminophen level     Status: Abnormal   Collection Time: 12/22/14  9:48  PM  Result Value Ref Range   Acetaminophen (Tylenol), Serum <10 (L) 10 - 30 ug/mL    Comment:        THERAPEUTIC CONCENTRATIONS VARY SIGNIFICANTLY. A RANGE OF 10-30 ug/mL MAY BE AN EFFECTIVE CONCENTRATION FOR MANY PATIENTS. HOWEVER, SOME ARE BEST TREATED AT CONCENTRATIONS OUTSIDE THIS RANGE. ACETAMINOPHEN CONCENTRATIONS >150 ug/mL AT 4 HOURS AFTER INGESTION AND >50 ug/mL AT 12 HOURS AFTER INGESTION ARE OFTEN ASSOCIATED WITH TOXIC REACTIONS.   CBC     Status: Abnormal   Collection Time: 12/22/14  9:48 PM  Result Value Ref Range   WBC 6.4 4.0 - 10.5 K/uL   RBC 4.27 4.22 - 5.81 MIL/uL   Hemoglobin 13.8 13.0 - 17.0 g/dL   HCT 89.3 41.4 - 92.1 %   MCV 95.1 78.0 - 100.0 fL   MCH 32.3 26.0 - 34.0 pg   MCHC 34.0 30.0 - 36.0 g/dL   RDW 88.6 (H) 36.1 - 10.3 %   Platelets 173 150 - 400 K/uL    Metabolic Disorder Labs:  Lab Results  Component Value Date   HGBA1C 5.5 08/14/2013   MPG 111 08/14/2013   No results found for: PROLACTIN Lab Results  Component Value Date   CHOL 153 08/14/2013   TRIG 235* 08/14/2013   HDL 36* 08/14/2013   CHOLHDL 4.3 08/14/2013   VLDL 47* 08/14/2013   LDLCALC 70 08/14/2013   LDLCALC 112* 03/05/2009    Current Medications: Current Facility-Administered Medications  Medication Dose Route Frequency Provider Last Rate Last Dose  . acetaminophen (TYLENOL) tablet 650 mg  650 mg Oral Q6H PRN Beau Fanny, FNP      . albuterol (PROVENTIL HFA;VENTOLIN HFA) 108 (90 BASE) MCG/ACT inhaler 2 puff  2 puff Inhalation Q6H PRN Rachael Fee, MD   2 puff at 12/23/14 1120  . alum & mag hydroxide-simeth (MAALOX/MYLANTA) 200-200-20  MG/5ML suspension 30 mL  30 mL Oral Q4H PRN Benjamine Mola, FNP      . hydrOXYzine (ATARAX/VISTARIL) tablet 25 mg  25 mg Oral Q6H PRN Benjamine Mola, FNP      . lisinopril (PRINIVIL,ZESTRIL) tablet 5 mg  5 mg Oral Daily Elyse Jarvis Withrow, FNP      . loperamide (IMODIUM) capsule 2-4 mg  2-4 mg Oral PRN Benjamine Mola, FNP      . LORazepam  (ATIVAN) tablet 1 mg  1 mg Oral Q6H PRN Benjamine Mola, FNP      . LORazepam (ATIVAN) tablet 1 mg  1 mg Oral QID Benjamine Mola, FNP   1 mg at 12/23/14 1421   Followed by  . [START ON 12/24/2014] LORazepam (ATIVAN) tablet 1 mg  1 mg Oral TID Benjamine Mola, FNP       Followed by  . [START ON 12/25/2014] LORazepam (ATIVAN) tablet 1 mg  1 mg Oral BID Benjamine Mola, FNP       Followed by  . [START ON 12/26/2014] LORazepam (ATIVAN) tablet 1 mg  1 mg Oral Daily John C Withrow, FNP      . magnesium hydroxide (MILK OF MAGNESIA) suspension 30 mL  30 mL Oral Daily PRN Benjamine Mola, FNP      . multivitamin with minerals tablet 1 tablet  1 tablet Oral Daily Benjamine Mola, FNP   1 tablet at 12/23/14 1421  . ondansetron (ZOFRAN-ODT) disintegrating tablet 4 mg  4 mg Oral Q6H PRN Benjamine Mola, FNP      . [START ON 12/24/2014] thiamine (VITAMIN B-1) tablet 100 mg  100 mg Oral Daily Benjamine Mola, FNP      . traZODone (DESYREL) tablet 100 mg  100 mg Oral QHS PRN Benjamine Mola, FNP       PTA Medications: Prescriptions prior to admission  Medication Sig Dispense Refill Last Dose  . acamprosate (CAMPRAL) 333 MG tablet Take 2 tablets (666 mg total) by mouth 3 (three) times daily with meals. (Patient not taking: Reported on 12/22/2014) 180 tablet 0 Not Taking at Unknown time  . hydrOXYzine (ATARAX/VISTARIL) 50 MG tablet Take 1 tablet (50 mg total) by mouth every 6 (six) hours as needed for anxiety. (Patient not taking: Reported on 12/22/2014) 30 tablet 0 Not Taking at Unknown time  . lisinopril (PRINIVIL,ZESTRIL) 5 MG tablet Take 1 tablet (5 mg total) by mouth daily. (Patient not taking: Reported on 12/22/2014) 30 tablet 0 Not Taking at Unknown time  . traZODone (DESYREL) 100 MG tablet Take 1 tablet (100 mg total) by mouth at bedtime as needed for sleep. (Patient not taking: Reported on 12/22/2014) 30 tablet 0 Not Taking at Unknown time    Musculoskeletal: Strength & Muscle Tone: within normal limits Gait  & Station: normal Patient leans: normal  Psychiatric Specialty Exam: Physical Exam  Review of Systems  Constitutional: Positive for malaise/fatigue.  HENT:       When detoxing  Eyes: Negative.   Respiratory: Positive for cough and shortness of breath.        Asthma  Cardiovascular: Positive for chest pain and palpitations.  Gastrointestinal: Positive for heartburn, nausea, vomiting and diarrhea.  Genitourinary: Negative.   Musculoskeletal: Negative.   Skin: Positive for rash.       Sore from lying around picks at them  Neurological: Positive for weakness and headaches.  Endo/Heme/Allergies: Negative.   Psychiatric/Behavioral: Positive for depression, hallucinations and substance abuse.  The patient is nervous/anxious and has insomnia.     Blood pressure 135/86, pulse 107, temperature 97.9 F (36.6 C), temperature source Oral, resp. rate 19, height '5\' 10"'$  (1.778 m), weight 98.884 kg (218 lb), SpO2 97 %.Body mass index is 31.28 kg/(m^2).  General Appearance: Disheveled  Eye Sport and exercise psychologist::  Fair  Speech:  Clear and Coherent  Volume:  Decreased  Mood:  Anxious and Depressed  Affect:  anxious worried  Thought Process:  Coherent and Goal Directed  Orientation:  Full (Time, Place, and Person)  Thought Content:  symptoms events worries concerns  Suicidal Thoughts:  No  Homicidal Thoughts:  No  Memory:  Immediate;   Fair Recent;   Fair Remote;   Fair  Judgement:  Fair  Insight:  Present  Psychomotor Activity:  Restlessness  Concentration:  Fair  Recall:  Poor  Fund of Knowledge:Fair  Language: Fair  Akathisia:  No  Handed:  Right  AIMS (if indicated):     Assets:  Desire for Improvement Housing Vocational/Educational  ADL's:  Intact  Cognition: WNL  Sleep:        Treatment Plan Summary: Daily contact with patient to assess and evaluate symptoms and progress in treatment and Medication management Supportive approach/coping skills Alcohol dependence; Ativan detox  protocol/work a relapse prevention plan Depression; reassess for the need of an antidepressant Work with CBT/mindfulness Explore need for a residential treatment program Recommend to be started on Vivitrol Observation Level/Precautions:  15 minute checks  Laboratory:  As per the ED and lipase  Psychotherapy:  Individual/group  Medications:  Ativan Detox protocol/work a relapse prevention plan  Consultations:    Discharge Concerns:    Estimated LOS: 3-5 days  Other:     I certify that inpatient services furnished can reasonably be expected to improve the patient's condition.   Anaily Ashbaugh A 12/14/20162:25 PM

## 2014-12-23 NOTE — Progress Notes (Signed)
Admission note:  Pt cooperative during the admission process. Pt denies SI/HI. Denies AVH. "My psychiatrist told me to come to the ED for medical clearance so she could start me on medication for cravings." Pt c/o nausea as withdrawal symptom. Pt presents with no insight in regards to reason for hospitalization. Pt reports feeling depressed and loss of interest in normal activities. He identifies his major stressors as financial and being around the holidays. "Thanksgiving sucked, no family around." Pt reports a poor relationship with his parents. Identifies his support system as AA. Pt reports he lives alone in apartment. Concerned d/t suppose to start new job tomorrow. Reports he is here for "detox, So i can get on the proper medication." Reports he had been compliant with follow up care since previous admission, but does report he has been off his medication d/t drinking. Skin assessment performed. Multiple scab areas noted on arms and upper back. Pt reports he "picks" All personal items locked in locker # 37. High fall risk d/t pt reports recent fall. Special checks q 15 mins iniated for safety. Oriented to unit. Will continue to monitor.

## 2014-12-23 NOTE — ED Notes (Signed)
Pt requesting to use inhaler; inhaler retrieved for pt to use. Lung sounds clear. Inhaler placed back in belongings

## 2014-12-23 NOTE — Progress Notes (Signed)
[facility] Dr. Dub MikesLugo. Admission is voluntary, pt can arrive anytime. Number to give report is [phone].  Ilean SkillMeghan Apryle Stowell, MSW, LCSW Clinical Social Work, Disposition  12/23/2014 [phone]

## 2014-12-23 NOTE — ED Notes (Signed)
Attempted report to [facility] 

## 2014-12-23 NOTE — Tx Team (Signed)
Initial Interdisciplinary Treatment Plan   PATIENT STRESSORS: Financial difficulties Substance abuse   PATIENT STRENGTHS: Ability for insight Average or above average intelligence Communication skills General fund of knowledge   PROBLEM LIST: Problem List/Patient Goals Date to be addressed Date deferred Reason deferred Estimated date of resolution  Substance Abuse 12/23/2014     Financial concerns 12/23/2014     "I have been off medication because of drinking." 12/23/2014     "detox so I can get on the proper medications" 12/23/2014                                    DISCHARGE CRITERIA:  Ability to meet basic life and health needs Improved stabilization in mood, thinking, and/or behavior  PRELIMINARY DISCHARGE PLAN: Return to previous living arrangement    PATIENT/FAMIILY INVOLVEMENT: This treatment plan has been presented to and reviewed with the patient, Ian CapJonathan N [name],Ian [name] 12/23/2014, 1:50 PM

## 2014-12-23 NOTE — [facility] Counselor (Signed)
[facility] tina [facility] [facility] , regarding patient history of DT. Nurse asked pt. Patient states no history of DT from substance abuse. Sinia Antosh K. Jesus GeneraHarris, LPC-A, [facility]  Counselor 12/23/2014 6:22 AM

## 2014-12-23 NOTE — BH Assessment (Signed)
Tele Assessment Note   Ian PlowmanJonathan N [name] is an 34 y.o. male. who presents to [facility] with the presenting problem of suicidal ideations and excessive alcohol consumption. He stated he is currently having suicidal thoughts. He reports that his alcohol use has strained his relationship with others and that his parents see him as a disappointment.   Patient states that he has not attempted suicide in the past but has received inpatient treatment for anxiety, depression, and alcoholism [facility] 2016 [facility]. Patient reports depressive symptoms that include: insomnia, guilt, feelings of angry/irritability, and fatigue. He shares that he consumes alcohol daily  up to 1.5 gallons of vodka, with first age of use at or around [age], and last reported use on 12/22/14 with 1/4 gallon vodka consumption.   Patient reports that he currently sees [facility] for outpatient services, and reports that his psychiatrist is Dr. Alla FeelingVallerie, and that his therapist is Ian [name]. Patient lives by self. Patient denies history or present AVH, but acknowledges AVH when drinking excessive amounts of alcohol followed by paranoia of people coming to shoot him.  Patient is dressed in scrubs and is alert and oriented x4. Patient speech was within normal limits and motor behavior appeared normal. Patient thought process is coherent. Patient does not appear to be responding to internal stimuli. Patient was cooperative throughout the assessment and states that he is agreeable to inpatient psychiatric treatment.  Diagnosis:  296.30 [F33.9] Major Depressive Disorder, Recurrent Episode, Unspecified; 300.00 [F41.9] Unspecified Anxiety Disorder; 303.90 [F10.20] Alcohol use Disorder, Severe  Past Medical History:  Past Medical History  Diagnosis Date  . Anxiety   . Depression   . Hypertension   . Asthma   . Alcohol dependence (HCC)   . Pancreatitis     Past Surgical History  Procedure Laterality Date  . No past surgeries       Family History:  Family History  Problem Relation Age of Onset  . OCD Mother   . Suicidality Neg Hx     Social History:  reports that he quit smoking about 15 years ago. His smoking use included Cigarettes. He has never used smokeless tobacco. He reports that he drinks alcohol. He reports that he does not use illicit drugs.  Additional Social History:  Alcohol / Drug Use Pain Medications: SEE MAR Prescriptions: SEE MAR Over the Counter: SEE MAR History of alcohol / drug use?: Yes Longest period of sobriety (when/how long): 13-14 months in 2013 Negative Consequences of Use: Personal relationships, Financial Withdrawal Symptoms: Patient aware of relationship between substance abuse and physical/medical complications, Weakness, Tremors, Blackouts, Sweats Substance #1 Name of Substance 1: alcohol 1 - Age of First Use: at or around age 34 1 - Amount (size/oz): 1.5 gallons of vodka 1 - Frequency: daily 1 - Duration: years 1 - Last Use / Amount: 12/22/14 1/4 gallon vodka  CIWA: CIWA-Ar BP: 105/68 mmHg Pulse Rate: 105 Nausea and Vomiting: no nausea and no vomiting Tactile Disturbances: none Tremor: no tremor Auditory Disturbances: not present Paroxysmal Sweats: no sweat visible Visual Disturbances: not present Anxiety: two Headache, Fullness in Head: none present Agitation: normal activity Orientation and Clouding of Sensorium: oriented and can do serial additions CIWA-Ar Total: 2 COWS:    PATIENT STRENGTHS: (choose at least two) Active sense of humor Average or above average intelligence Communication skills  Allergies: No Known Allergies  Home Medications:  (Not in a hospital admission)  OB/GYN Status:  No LMP for male patient.  General Assessment  Data Location of Assessment: [facility] ED TTS Assessment: In system Is this a Tele or Face-to-Face Assessment?: Tele Assessment Is this an Initial Assessment or a Re-assessment for this encounter?: Initial  Assessment Marital status: Single Maiden name: NA Is patient pregnant?: No Pregnancy Status: No Living Arrangements: Alone Can pt return to current living arrangement?: Yes Admission Status: Voluntary Is patient capable of signing voluntary admission?: Yes Referral Source: Psychiatrist Insurance type: Oceanographer Exam [facility] Walk-in ONLY) Medical Exam completed: Yes  Crisis Care Plan Living Arrangements: Alone Name of Psychiatrist: Dr. Vikki Ports Name of Therapist: Beth  Education Status Is patient currently in school?: No Current Grade: Na Highest grade of school patient has completed: B.S. degree Name of school: unknown Contact person: unspecified  Risk to self with the past 6 months Suicidal Ideation: Yes-Currently Present Has patient been a risk to self within the past 6 months prior to admission? : No Suicidal Intent: No Has patient had any suicidal intent within the past 6 months prior to admission? : No Is patient at risk for suicide?: Yes Suicidal Plan?: No Has patient had any suicidal plan within the past 6 months prior to admission? : No Access to Means: No What has been your use of drugs/alcohol within the last 12 months?: alcohol, severe Previous Attempts/Gestures: No How many times?: 0 Other Self Harm Risks: none noted Triggers for Past Attempts: Unpredictable Intentional Self Injurious Behavior: None Family Suicide History: No Recent stressful life event(s): Turmoil (Comment) (unspecified) Persecutory voices/beliefs?: No Depression: Yes Depression Symptoms: Despondent, Insomnia, Tearfulness, Isolating, Fatigue, Loss of interest in usual pleasures Substance abuse history and/or treatment for substance abuse?: Yes Suicide prevention information given to non-admitted patients: Yes  Risk to Others within the past 6 months Homicidal Ideation: No Does patient have any lifetime risk of violence toward others beyond the six months prior to  admission? : Unknown Thoughts of Harm to Others: No Current Homicidal Intent: No Current Homicidal Plan: No Access to Homicidal Means: No Identified Victim: NA History of harm to others?: No Assessment of Violence: None Noted Violent Behavior Description: none noted Does patient have access to weapons?: No Criminal Charges Pending?: No Does patient have a court date: No Is patient on probation?: No  Psychosis Hallucinations: Auditory, Visual (pt mentioned happens when drinking alcohol) Delusions: Unspecified  Mental Status Report Appearance/Hygiene: In scrubs Eye Contact: Good Motor Activity: Echopraxia, Hyperactivity, Tremors Speech: Logical/coherent Level of Consciousness: Alert Mood: Depressed, Despair, Fearful, Helpless, Irritable, Sad Affect: Depressed, Irritable Anxiety Level: Moderate Thought Processes: Flight of Ideas Judgement: Partial Orientation: Person, Place, Time, Situation, Appropriate for developmental age Obsessive Compulsive Thoughts/Behaviors: None  Cognitive Functioning Concentration: Normal Memory: Recent Intact, Remote Intact IQ: Average Insight: Fair Impulse Control: Fair Appetite: Good Weight Loss: 20 Weight Gain: 0 Sleep: Decreased Total Hours of Sleep: 3 Vegetative Symptoms: None  ADLScreening [facility] Assessment Services) Patient's cognitive ability adequate to safely complete daily activities?: Yes Patient able to express need for assistance with ADLs?: Yes Independently performs ADLs?: Yes (appropriate for developmental age)  Prior Inpatient Therapy Prior Inpatient Therapy: Yes Prior Therapy Dates: 2016, 2013 Prior Therapy Facilty/Provider(s): [facility] Reason for Treatment: depression, anxiety, substance abuse  Prior Outpatient Therapy Prior Outpatient Therapy: Yes Prior Therapy Dates: current Prior Therapy Facilty/Provider(s): [facility] Reason for Treatment: anxiety, depression, substance abuse Does patient have an ACCT  team?: No Does patient have Intensive In-House Services?  : No Does patient have [facility] services? : No Does patient have P4CC services?: No  ADL Screening (  condition at time of admission) Patient's cognitive ability adequate to safely complete daily activities?: Yes Is the patient deaf or have difficulty hearing?: No Does the patient have difficulty seeing, even when wearing glasses/contacts?: No Does the patient have difficulty concentrating, remembering, or making decisions?: No Patient able to express need for assistance with ADLs?: Yes Does the patient have difficulty dressing or bathing?: No Independently performs ADLs?: Yes (appropriate for developmental age) Does the patient have difficulty walking or climbing stairs?: No Weakness of Legs: None Weakness of Arms/Hands: None  Home Assistive Devices/Equipment Home Assistive Devices/Equipment: None    Abuse/Neglect Assessment (Assessment to be complete while patient is alone) Physical Abuse: Denies Verbal Abuse: Denies Sexual Abuse: Denies Exploitation of patient/patient's resources: Denies Self-Neglect: Denies Values / Beliefs Cultural Requests During Hospitalization: None Spiritual Requests During Hospitalization: None   Advance Directives (For Healthcare) Does patient have an advance directive?: No Would patient like information on creating an advanced directive?: No - patient declined information    Additional Information 1:1 In Past 12 Months?: No CIRT Risk: No Elopement Risk: No Does patient have medical clearance?: Yes     Disposition: Per Karleen Hampshire, PA meets inpatient criteria dual diagnosis Disposition Initial Assessment Completed for this Encounter: Yes Disposition of Patient: Inpatient treatment program  Hipolito Bayley 12/23/2014 2:46 AM

## 2014-12-23 NOTE — BH Assessment (Signed)
Having problems with Epic, tele-psych was delayed due to in and out acces to Epic read only. Tele-psych resumed, and afterwards this Clinical research associatewriter consulted with Ian HampshireSpencer, PA who states that pt. Meets criteria for inpatient with dual diagnosis.  Patient has elevated BAC,  [facility] bed available on [address] pending. Ian [name] K. Sherlon HandingHarris, LCAS-A, LPC-A, [facility]  Counselor 12/23/2014 3:29 AM

## 2014-12-23 NOTE — ED Notes (Addendum)
Patient was given a ginger ale.  

## 2014-12-23 NOTE — Clinical Social Work Note (Signed)
CSW left message for Vikki PortsValerie at [facility] in attempt to cancel/rescudule Pt's appt for Thursday 12/24/14. CSW requested call back at earliest convenience.   Smart, Myreon Wimer, LCSW 12/23/2014, 2:54 PM

## 2014-12-23 NOTE — [facility] Suicide Risk Assessment (Signed)
[facility] Ad[facility] Suicide Risk Assessment   Nursing information obtained from:  Patient Demographic factors:  Male, Caucasian, Living alone Current Mental Status:  NA Loss Factors:  Financial problems / change in socioeconomic status Historical Factors:  Impulsivity Risk Reduction Factors:  Responsible for children under 218 years of age, Sense of responsibility to family Total Time spent with patient: 45 minutes Principal Problem: Alcohol use disorder, severe, dependence (HCC) Diagnosis:   Patient Active Problem List   Diagnosis Date Noted  . Alcohol use disorder, severe, dependence (HCC) [F10.20] 08/23/2014  . Substance induced mood disorder (HCC) [F19.94] 08/23/2014  . Suicidal ideation [R45.851] 08/23/2014  . Pancreatitis [K85.9] 01/30/2014  . Acute pancreatitis [K85.90] 01/30/2014  . Abdominal pain [R10.9] 01/30/2014  . Nausea and vomiting [R11.2] 01/30/2014  . Hyponatremia [E87.1] 01/30/2014  . Leukocytosis [D72.829]   . Nicotine abuse [F19.10] 08/14/2013  . GAD (generalized anxiety disorder) [F41.1] 08/14/2013  . Major depressive disorder, recurrent episode, moderate (HCC) [F33.1] 08/14/2013  . ABDOMINAL PAIN OTHER SPECIFIED SITE [R10.9] 08/05/2009  . CHEST PAIN [R07.9] 01/19/2009  . CERUMEN IMPACTION [H61.20] 07/31/2008  . ASTHMA [J45.909] 07/31/2008     Continued Clinical Symptoms:  Alcohol Use Disorder Identification Test Final Score (AUDIT): 30 The "Alcohol Use Disorders Identification Test", Guidelines for Use in Primary Care, Second Edition.  World Science writerHealth Organization [facility](WHO). Score between 0-7:  no or low risk or alcohol related problems. Score between 8-15:  moderate risk of alcohol related problems. Score between 16-19:  high risk of alcohol related problems. Score 20 or above:  warrants further diagnostic evaluation for alcohol dependence and treatment.   CLINICAL FACTORS:   Depression:   Comorbid alcohol abuse/dependence Alcohol/Substance  Abuse/Dependencies  Psychiatric Specialty Exam: Physical Exam  ROS  Blood pressure 132/92, pulse 83, temperature 97.9 F (36.6 C), temperature source Oral, resp. rate 19, height 5\' 10"  (1.778 m), weight 98.884 kg (218 lb), SpO2 97 %.Body mass index is 31.28 kg/(m^2).   COGNITIVE FEATURES THAT CONTRIBUTE TO RISK:  Closed-mindedness, Polarized thinking and Thought constriction (tunnel vision)    SUICIDE RISK:   Mild:  Suicidal ideation of limited frequency, intensity, duration, and specificity.  There are no identifiable plans, no associated intent, mild dysphoria and related symptoms, good self-control (both objective and subjective assessment), few other risk factors, and identifiable protective factors, including available and accessible social support.  PLAN OF CARE: See admission H and P  Medical Decision Making:  Review of Psycho-Social Stressors (1), Review or order clinical lab tests (1), Review of Medication Regimen & Side Effects (2) and Review of New Medication or Change in Dosage (2)  I certify that inpatient services furnished can reasonably be expected to improve the patient's condition.   Laury Huizar A 12/23/2014, 5:18 PM

## 2014-12-23 NOTE — ED Provider Notes (Signed)
  Physical Exam  BP 120/82 mmHg  Pulse 84  Temp(Src) 97.8 F (36.6 C) (Oral)  Resp 16  SpO2 95%  Physical Exam  ED Course  Procedures  [facility] by Dr. Dub MikesLugo.      Ian CoreNathan Arlon Bleier, MD 12/23/14 [phone]

## 2014-12-23 NOTE — ED Notes (Signed)
Patient was given a Ginger Ale. 

## 2014-12-24 LAB — LIPASE, BLOOD: Lipase: 129 U/L — ABNORMAL HIGH (ref 11–51)

## 2014-12-24 MED ORDER — ALBUTEROL SULFATE HFA 108 (90 BASE) MCG/ACT IN AERS
2.0000 | INHALATION_SPRAY | RESPIRATORY_TRACT | Status: DC | PRN
  Administered 2014-12-24 – 2014-12-25 (×5): 2 via RESPIRATORY_TRACT

## 2014-12-24 MED ORDER — ALBUTEROL SULFATE HFA 108 (90 BASE) MCG/ACT IN AERS
2.0000 | INHALATION_SPRAY | RESPIRATORY_TRACT | Status: DC | PRN
  Administered 2014-12-24: 2 via RESPIRATORY_TRACT

## 2014-12-24 MED ORDER — TRAZODONE HCL 150 MG PO TABS
150.0000 mg | ORAL_TABLET | Freq: Every day | ORAL | Status: DC
  Administered 2014-12-24: 150 mg via ORAL
  Filled 2014-12-24 (×2): qty 1

## 2014-12-24 MED ORDER — FLUOXETINE HCL 10 MG PO CAPS
10.0000 mg | ORAL_CAPSULE | Freq: Every day | ORAL | Status: DC
Start: 2014-12-24 — End: 2014-12-25
  Administered 2014-12-24 – 2014-12-25 (×2): 10 mg via ORAL
  Filled 2014-12-24 (×4): qty 1

## 2014-12-24 NOTE — Tx Team (Signed)
Interdisciplinary Treatment Plan Update (Adult)  Date:  12/24/2014  Time Reviewed:  8:34 AM   Progress in Treatment: Attending groups: Yes. Participating in groups:  Yes. Taking medication as prescribed:  Yes. Tolerating medication:  Yes. Family/Significant othe contact made:  SPE required for this pt.  Patient understands diagnosis:  Yes. and As evidenced by:  seeking treatment for ETOH detox, depression, SI, and for medication stabilization. Discussing patient identified problems/goals with staff:  Yes. Medical problems stabilized or resolved:  Yes. Denies suicidal/homicidal ideation: Yes. Issues/concerns per patient self-inventory:  Other:  Discharge Plan or Barriers: Pt plans to return home and continue med management and Counseling at [facility]. Pt given Mental Health Association information and AA list for [facility] as well.   Reason for Continuation of Hospitalization: Depression Medication stabilization Withdrawal symptoms  Comments:  Ian [name] is an 34 y.o. male. who presents to [facility] ER with the presenting problem of suicidal ideations and excessive alcohol consumption. He stated he is currently having suicidal thoughts. He reports that his alcohol use has strained his relationship with others and that his parents see him as a disappointment. Patient states that he has not attempted suicide in the past but has received inpatient treatment for anxiety, depression, and alcoholism in 2016 and 2013 at [facility] Patient reports depressive symptoms that include: insomnia, guilt, feelings of angry/irritability, and fatigue. He shares that he consumes alcohol daily up to 1.5 gallons of vodka, with first age of use at or around 68, and last reported use on 12/22/14 with 1/4 gallon vodka consumption. Patient reports that he currently sees [facility] for outpatient services, and reports that his psychiatrist is Dr. Gerome Apley, and that his therapist  is Eustaquio Maize. Patient lives by self. Patient denies history or present AVH, but acknowledges AVH when drinking excessive amounts of alcohol followed by paranoia of people coming to shoot him.Diagnosis: 296.30 [F33.9] Major Depressive Disorder, Recurrent Episode, Unspecified; 300.00 [F41.9] Unspecified Anxiety Disorder; 303.90 [F10.20] Alcohol use Disorder, Severe  Estimated length of stay:  3-5 days   Additional Comments:  Patient and CSW reviewed pt's identified goals and treatment plan. Patient verbalized understanding and agreed to treatment plan. CSW reviewed [facility] "Discharge Process and Patient Involvement" Form. Pt verbalized understanding of information provided and signed form.    Review of initial/current patient goals per problem list:  1. Goal(s): Patient will participate in aftercare plan  Met: Yes  Target date: at discharge  As evidenced by: Patient will participate within aftercare plan AEB aftercare provider and housing plan at discharge being identified.  12/15: Pt plans to return home at d/c and will continue to see his providers at [facility]   2. Goal (s): Patient will exhibit decreased depressive symptoms and suicidal ideations.  Met: No.    Target date: at discharge  As evidenced by: Patient will utilize self rating of depression at 3 or below and demonstrate decreased signs of depression or be deemed stable for discharge by MD.  12/15: Pt rates depression as 7/10 and presents with depressed mood/flat affect. Pt denies SI/HI/AVH.   3. Goal(s): Patient will demonstrate decreased signs of withdrawal due to substance abuse  Met:No.  Target date:at discharge   As evidenced by: Patient will produce a CIWA/COWS score of 0, have stable vitals signs, and no symptoms of withdrawal.   12/15: Pt reports moderate withdrawals with CIWA score of 4 and high BP.   Attendees: Patient:   12/24/2014 8:34 AM   Family:  12/24/2014 8:34 AM   Physician:  Dr.  Larene Beach. Eappen Md  12/24/2014 8:34 AM   Nursing:   Betsey Holiday RN 12/24/2014 8:34 AM   Clinical Social Worker: Maxie Better, LCSW 12/24/2014 8:34 AM   Clinical Social Worker: ; Peri Maris LCSWA 12/24/2014 8:34 AM   Other:  Gerline Legacy Nurse Case Manager 12/24/2014 8:34 AM   Other: 12/24/2014 8:34 AM   Other:   12/24/2014 8:34 AM   Other:  12/24/2014 8:34 AM   Other:  12/24/2014 8:34 AM   Other:  12/24/2014 8:34 AM    12/24/2014 8:34 AM    12/24/2014 8:34 AM    12/24/2014 8:34 AM    12/24/2014 8:34 AM    Scribe for Treatment Team:   Maxie Better, LCSW 12/24/2014 8:34 AM

## 2014-12-24 NOTE — Progress Notes (Signed)
D   Pt has been depressed and anxious this shift   He has been tearful and requesting medication for anxiety and to help him sleep because he was having some troublesome thoughts  A   Verbal support given   Medications administered and effectiveness monitored   Encourage diversional activity and groups    Q 15 min checks R   Pt safe at present

## 2014-12-24 NOTE — [facility] Group Notes (Signed)
[facility] Group Notes:  (Nursing/MHT/Case Management/Adjunct)  Date:  12/24/2014  Time:  0930  Type of Therapy:  Nurse Education  Participation Level:  Active  Participation Quality:  Appropriate and Attentive  Affect:  Appropriate  Cognitive:  Alert and Appropriate  Insight:  Good  Engagement in Group:  Engaged  Modes of Intervention:  Activity, Clarification, Socialization and Support  Summary of Progress/Problems:  Ian [name] 12/24/2014, 10:45 AM

## 2014-12-24 NOTE — [facility] Group Notes (Signed)
[facility] 12/24/2014 1:15pm  Type of Therapy: Mental Health Association Presentation  Participation Level: Active  Participation Quality: Attentive  Affect: Appropriate  Cognitive: Oriented  Insight: Developing/Improving  Engagement in Therapy: Engaged  Modes of Intervention: Discussion, Education and Socialization  Summary of Progress/Problems: Mental Health Association (MHA) Speaker came to talk about his personal journey with substance abuse and addiction. The pt processed ways by which to relate to the speaker. MHA speaker provided handouts and educational information pertaining to groups and services offered by the [facility]. Pt was engaged in speaker's presentation and was receptive to resources provided.    Chad CordialLauren Carter, LCSWA 12/24/2014 1:25 PM

## 2014-12-24 NOTE — Progress Notes (Signed)
Patient did attend the evening karaoke group. Pt was engaged and supportive but did not participate by singing a song.    

## 2014-12-24 NOTE — Progress Notes (Signed)
[facility] MD Progress Note  12/24/2014 2:21 PM Ian [name]   MRN:  [id number]  Subjective: Ian [name] reports, "I'm here for alcohol detox. I'm not doing too bad, rather, I still have a lot of anxiety, feeling the tremors sometimes. My mood is fluctating a lot. I believe, I need something for the depression. I did not quite sleep well last night. I could use more sleep if I can get it. I was told by doctor [place] that he may start me on some medicine for my alcoholism, Vivitrol he called it?  Principal Problem: Alcohol use disorder, severe, dependence (HCC)  Diagnosis:   Patient Active Problem List   Diagnosis Date Noted  . Alcohol use disorder, severe, dependence (HCC) [F10.20] 08/23/2014  . Substance induced mood disorder (HCC) [F19.94] 08/23/2014  . Suicidal ideation [R45.851] 08/23/2014  . Pancreatitis [K85.9] 01/30/2014  . Acute pancreatitis [K85.90] 01/30/2014  . Abdominal pain [R10.9] 01/30/2014  . Nausea and vomiting [R11.2] 01/30/2014  . Hyponatremia [E87.1] 01/30/2014  . Leukocytosis [D72.829]   . Nicotine abuse [F19.10] 08/14/2013  . GAD (generalized anxiety disorder) [F41.1] 08/14/2013  . Major depressive disorder, recurrent episode, moderate (HCC) [F33.1] 08/14/2013  . ABDOMINAL PAIN OTHER SPECIFIED SITE [R10.9] 08/05/2009  . CHEST PAIN [R07.9] 01/19/2009  . CERUMEN IMPACTION [H61.20] 07/31/2008  . ASTHMA [J45.909] 07/31/2008   Total Time spent with patient: 25 minutes  Past Psychiatric History: Alcoholism, chronic  Past Medical History:  Past Medical History  Diagnosis Date  . Anxiety   . Depression   . Hypertension   . Asthma   . Alcohol dependence (HCC)   . Pancreatitis     Past Surgical History  Procedure Laterality Date  . No past surgeries     Family History:  Family History  Problem Relation Age of Onset  . OCD Mother   . Suicidality Neg Hx    Family Psychiatric  History: See H&P  Social History:  History  Alcohol Use  . Yes    Comment:  varies how much     History  Drug Use No    Social History   Social History  . Marital Status: Single    Spouse Name: N/A  . Number of Children: N/A  . Years of Education: N/A   Social History Main Topics  . Smoking status: Former Smoker    Types: Cigarettes    Quit date: 01/31/1999  . Smokeless tobacco: Never Used  . Alcohol Use: Yes     Comment: varies how much  . Drug Use: No  . Sexual Activity: Yes    Birth Control/ Protection: Condom   Other Topics Concern  . None   Social History Narrative   Additional Social History:    History of alcohol / drug use?: Yes Longest period of sobriety (when/how long): 13-14 months in 2013 Negative Consequences of Use: Financial, Personal relationships Withdrawal Symptoms: Nausea / Vomiting Name of Substance 1: alcohol 1 - Age of First Use: at or around age 50 1 - Amount (size/oz): 1.5 gallons of vodka 1 - Frequency: daily 1 - Duration: years 1 - Last Use / Amount: 12/22/14 1/4 gallon vodka  Sleep: Poor  Appetite:  Fair  Current Medications: Current Facility-Administered Medications  Medication Dose Route Frequency Provider Last Rate Last Dose  . acetaminophen (TYLENOL) tablet 650 mg  650 mg Oral Q6H PRN Beau Fanny, FNP      . albuterol (PROVENTIL HFA;VENTOLIN HFA) 108 (90 BASE) MCG/ACT inhaler 2 puff  2 puff Inhalation  Q4H PRN Sanjuana Kava, NP      . alum & mag hydroxide-simeth (MAALOX/MYLANTA) 200-200-20 MG/5ML suspension 30 mL  30 mL Oral Q4H PRN Beau Fanny, FNP      . FLUoxetine (PROZAC) capsule 10 mg  10 mg Oral Daily Sanjuana Kava, NP      . hydrOXYzine (ATARAX/VISTARIL) tablet 25 mg  25 mg Oral Q6H PRN Beau Fanny, FNP   25 mg at 12/24/14 4098  . lisinopril (PRINIVIL,ZESTRIL) tablet 5 mg  5 mg Oral Daily Beau Fanny, FNP   5 mg at 12/24/14 0810  . loperamide (IMODIUM) capsule 2-4 mg  2-4 mg Oral PRN Beau Fanny, FNP      . LORazepam (ATIVAN) tablet 1 mg  1 mg Oral Q6H PRN Beau Fanny, FNP   1 mg  at 12/24/14 1191  . LORazepam (ATIVAN) tablet 1 mg  1 mg Oral TID Beau Fanny, FNP   1 mg at 12/24/14 1157   Followed by  . [START ON 12/25/2014] LORazepam (ATIVAN) tablet 1 mg  1 mg Oral BID Beau Fanny, FNP       Followed by  . [START ON 12/26/2014] LORazepam (ATIVAN) tablet 1 mg  1 mg Oral Daily John C Withrow, FNP      . magnesium hydroxide (MILK OF MAGNESIA) suspension 30 mL  30 mL Oral Daily PRN Beau Fanny, FNP      . multivitamin with minerals tablet 1 tablet  1 tablet Oral Daily Beau Fanny, FNP   1 tablet at 12/24/14 [id number]  . ondansetron (ZOFRAN-ODT) disintegrating tablet 4 mg  4 mg Oral Q6H PRN Beau Fanny, FNP      . thiamine (VITAMIN B-1) tablet 100 mg  100 mg Oral Daily Beau Fanny, FNP   100 mg at 12/24/14 9562  . traZODone (DESYREL) tablet 150 mg  150 mg Oral QHS Sanjuana Kava, NP        Lab Results:  Results for orders placed or performed during the hospital encounter of 12/23/14 (from the past 48 hour(s))  Lipase, blood     Status: Abnormal   Collection Time: 12/24/14  6:35 AM  Result Value Ref Range   Lipase 129 (H) 11 - 51 U/L    Comment: Performed at [facility]    Physical Findings: AIMS: Facial and Oral Movements Muscles of Facial Expression: None, normal Lips and Perioral Area: None, normal Jaw: None, normal Tongue: None, normal,Extremity Movements Upper (arms, wrists, hands, fingers): None, normal Lower (legs, knees, ankles, toes): None, normal, Trunk Movements Neck, shoulders, hips: None, normal, Overall Severity Severity of abnormal movements (highest score from questions above): None, normal Incapacitation due to abnormal movements: None, normal Patient's awareness of abnormal movements (rate only patient's report): No Awareness, Dental Status Current problems with teeth and/or dentures?: No Does patient usually wear dentures?: No  CIWA:  CIWA-Ar Total: 3 COWS:     Musculoskeletal: Strength & Muscle Tone: within  normal limits Gait & Station: normal Patient leans: N/A  Psychiatric Specialty Exam: Review of Systems  Constitutional: Positive for malaise/fatigue.  HENT: Negative.   Eyes: Negative.   Respiratory: Negative.   Cardiovascular: Negative.   Gastrointestinal: Negative.   Genitourinary: Negative.   Musculoskeletal: Negative.   Skin: Negative.   Neurological: Positive for tremors and weakness.  Psychiatric/Behavioral: Positive for depression (rates depression at #8) and substance abuse (Hx. alcoholism, chronic). Negative for suicidal ideas, hallucinations and memory loss.  The patient is nervous/anxious and has insomnia.     Blood pressure 120/76, pulse 94, temperature 97.9 F (36.6 C), temperature source Oral, resp. rate 20, height 5\' 10"  (1.778 m), weight 98.884 kg (218 lb), SpO2 97 %.Body mass index is 31.28 kg/(m^2).   General Appearance: Casual  Eye Contact:: Good  Speech: Clear and Coherent  Volume:normal  Mood: Anxious and Depressed  Affect: anxious worried  Thought Process: Coherent and Goal Directed  Orientation: Full (Time, Place, and Person)  Thought Content: symptoms events worries concerns  Suicidal Thoughts: No  Homicidal Thoughts: No  Memory: Immediate; Fair Recent; Fair Remote; Fair  Judgement: Fair  Insight: Present  Psychomotor Activity: Restlessness, tremors  Concentration: Fair  Recall: Poor  Fund of Knowledge:Fair  Language: Fair  Akathisia: No  Handed: Right  AIMS (if indicated):    Assets: Desire for Improvement Housing Vocational/Educational  ADL's: Intact  Cognition: WNL  Sleep:4.75        Treatment Summary/Recommendation: 1. Continue crisis management, mood stabilization & relapse prevention.. 2. Continue current medication management to reduce current symptoms to base line and improve the  patient's overall level of functioning; initiate Fluoxetine 10 mg for depression, continue Ativan  detox protocols for alcohol detox, increased Trazodone form 1200 mg to 150 mg for insomnia 3. Treat health problems as indicated; continue Albuterol inhaler 2 puffs Q 4 hours prn for wheezing. 4. Develop treatment plan to enhance medication adeherance upon discharge and the need for  readmission. 5. Psycho-social education regarding relapse prevention and self care.  6.Explore more on the need for Vivitrol for chronic alcoholism.  Armandina StammerNwoko, Agnes I, PMHNP, FNP-BC 12/24/2014, 2:21 PM

## 2014-12-24 NOTE — Progress Notes (Signed)
D:Per patient self inventory form pt reports he slept fair last night with the use of sleep medication. He reports a fair appetite, low energy level, good concentration. He rates depression 8/10, hopelessness 8/10, anxiety 8/10- all on 0-10 scale, 10 being the worse. Pt presents with anxiety as withdrawal symptom. Pt reports his goal is "Call new job Teacher, English as a foreign language(heather) have scram brought to me today depression management." Pt reports he will "follow advice" to help meet his goal today. Pt attending group on the unit. Anxious, but pleasant on approach.  A:special checks q 15 mins in place for safety. Medication administered per MD order(see eMAR) Encouragement and support provided. Breathalyzer machine brought in by family and placed in med room with charger per pt request.  R:safety maintained. Complaint with medication regimen. Will continue to monitor.

## 2014-12-24 NOTE — Clinical Social Work Psych Note (Signed)
CSW contacted pt's employer-notified Tresa EndoKelly that he is in hospital and will be provided with hospital note at discharge.Pt made aware.   Trula SladeHeather Smart, MSW, LCSW Clinical Social Worker 12/24/2014 10:49 AM

## 2014-12-24 NOTE — [facility] Suicide Risk Assessment (Signed)
[facility] INPATIENT:  Family/Significant Other Suicide Prevention Education  Suicide Prevention Education:  Patient Refusal for Family/Significant Other Suicide Prevention Education: The patient Ian PlowmanJonathan N [name] has refused to provide written consent for family/significant other to be provided Family/Significant Other Suicide Prevention Education during admission and/or prior to discharge.  Physician notified.  SPE completed with pt, as pt refused to consent to family contact. SPI pamphlet provided to pt and pt was encouraged to share information with support network, ask questions, and talk about any concerns relating to SPE. Pt denies access to guns/firearms and verbalized understanding of information provided. Mobile Crisis information also provided to pt.    Smart, Farah Benish LCSW 12/24/2014, 10:53 AM

## 2014-12-24 NOTE — [facility] Counselor (Signed)
Adult Comprehensive Assessment  Patient ID: MADSEN RIDDLE, male   DOB: March 23, 1980, 34 y.o.   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  Family Relationships: Strained at best due to pt's drinking Financial / Lack of resources (include bankruptcy): limited income.pays out of pocket for [employer]. Housing / Lack of housing:: lives in apt in [place] with roommate.  Physical health (include injuries & life threatening diseases): HTN, Anxiety and Depression Social relationships: Isolates Substance abuse: relapse recently. Up to 1/2 gallon liquor daily since Wed of last week. No drug use. Bereavement / Loss: n/a   Living/Environment/Situation:  Living Arrangements: roommate. Apt in [place] Living conditions (as described by patient or guardian): fair; lease expires at end of Aug-may renew.  How long has patient lived in current situation?: 11 months What is atmosphere in current home: Other (Comment) safe; clean.   Family History:  Marital status: Single Does patient have children?: Yes How many children?: 1  How is patient's relationship with their children?: Patient reports good relationship w 74 YO son. Partial custody.   Childhood History:  By whom was/is the patient raised?: Both parents Additional childhood history information: Parents divorced at pt's age 7 Description of patient's relationship with caregiver when they were a child: "fine" Patient's description of current relationship with people who raised him/her: Srained with mother, father and step father Does patient have siblings?: Yes Number of Siblings: 1  Description of patient's current relationship with siblings: "Not great with older sister" Did patient suffer any verbal/emotional/physical/sexual abuse as a child?: No Did patient suffer  from severe childhood neglect?: No Has patient ever been sexually abused/assaulted/raped as an adolescent or adult?: No Was the patient ever a victim of a crime or a disaster?: No Witnessed domestic violence?: No Has patient been effected by domestic violence as an adult?: No  Education:  Highest grade of school patient has completed: 16 Currently a student?: No Contact person: [name]-STEP FATHER [phone] Learning disability?: No  Employment/Work Situation:  Employment situation: has been working for the past week as a Surveyor, minerals.  Patient's job has been impacted by current illness: Yes-missing work due to being hospitalized.  What is the longest time patient has a held a job?: 2 1/2 years  Where was the patient employed at that time?: Engineer, petroleum at [employer].  Has patient ever been in the [employer]?: No Has patient ever served in combat?: No  Financial Resources:  Financial resources: limited income from work; pays for [employer] out of pocket.   Alcohol/Substance Abuse:  What has been your use of drugs/alcohol within the last 12 months?: recent relapse. Up to half gallon liquor daily. No other drug use or tobacco use per pt.  If attempted suicide, did drugs/alcohol play a role in this?: (No attempt) Alcohol/Substance Abuse Treatment Hx: Past Tx, Inpatient If yes, describe treatment: [facility], Jan 2012 [facility] and 4/13 [facility]. [facility] in 2013 for detox.  Has alcohol/substance abuse ever caused legal problems?: Yes-DUI's. No recent court dates.   Social Support System:  Patient's Community Support System: Fair Museum/gallery exhibitions officer System: Family-"My parents are pissed at me and feel like I'm never [place] get myself together."  Type of faith/religion: Grew up Christain How does patient's faith help to cope with current illness?: "very involved in high school; but have slipped away"  Leisure/Recreation:   Leisure  and Hobbies: spending time with my son.   Strengths/Needs:  What things does the patient do well?: hard worker. Try to be a good parent.  In what areas does patient struggle / problems for patient: cravings; coping skills, anxiety.   Discharge Plan:  Does patient have access to transportation?:drives and has car.  Will patient be returning to same living situation after discharge?:home to apt.  [facility] but wants referral to another o/p provider now that he has insurance. [facility] or [facility] assessing.  Does patient have financial barriers related to discharge medications?: no.   Summary/Recommendations:  Leory PlowmanJonathan N Heinsohn is an 34 y.o. male. who [facility] with the presenting problem of suicidal ideations and excessive alcohol consumption. He stated he is currently having suicidal thoughts. He reports that his alcohol use has strained his relationship with others and that his parents see him as a disappointment. Patient states that he has not attempted suicide in the past but has received inpatient treatment for anxiety, depression, and alcoholism in 2016 and [facility]. Patient reports depressive symptoms that include: insomnia, guilt, feelings of angry/irritability, and fatigue. He shares that he consumes alcohol daily up to 1.5 gallons of vodka, with first age of use at or around [age], and last reported use on 12/22/14 with 1/4 gallon vodka consumption. Patient reports that [facility], and reports that his psychiatrist is Dr. Alla FeelingVallerie, and that his therapist is Waynetta SandyBeth. Patient lives by self. Patient denies history or present AVH, but acknowledges AVH when drinking excessive amounts of alcohol followed by paranoia of people coming to shoot him. Recommendations for pt include: crisis stabilization, medication evaluation, librium taper for  withdrawals, group therapy and psychoeducation in addition to case management for discharge planning. CSW assessing. Pt refusing referral to [facility] "Bad experience in the past." Pt plans to return home at d/c and [facility] with Waynetta SandyBeth (counseling) and Vikki PortsValerie (med management)-appts rescheduled.   Smart, Research scientist (physical sciences)Warren Kugelman. LCSW 12/24/2014 8:34 AM

## 2014-12-24 NOTE — Progress Notes (Deleted)
D:Per patient self inventory form pt reports she slept fair last night with the use of sleep medication. She reports a fair appetite, low energy level, poor concentration. She rates depression 8/10, hopelessness 10/10, anxiety 8/10- all on 0-10 scale, 10 being the worse. Pt denies SI/HI. Denies AVH. Denies S/S of withdrawals. Pt reports her goal is "getting out of here" and she will meet her goal by "don't know."Pt did not attend nursing group this morning. Flat affect.  A:Special checks q 15 mins in place for safety. Medication administered per MD order (see eMAR). Encouragement and support provided.  R:safety maintained. Compliant with medication regimen. Will continue to monitor.  

## 2014-12-25 DIAGNOSIS — F331 Major depressive disorder, recurrent, moderate: Secondary | ICD-10-CM

## 2014-12-25 DIAGNOSIS — F1020 Alcohol dependence, uncomplicated: Secondary | ICD-10-CM

## 2014-12-25 LAB — HEMOGLOBIN A1C
Hgb A1c MFr Bld: 5.9 % — ABNORMAL HIGH (ref 4.8–5.6)
Mean Plasma Glucose: 123 mg/dL

## 2014-12-25 MED ORDER — TRAZODONE HCL 150 MG PO TABS: 150.0000 mg | ORAL_TABLET | Freq: Every day | ORAL | Status: DC

## 2014-12-25 MED ORDER — HYDROXYZINE HCL 50 MG PO TABS: 50.0000 mg | ORAL_TABLET | Freq: Three times a day (TID) | ORAL | Status: DC | PRN

## 2014-12-25 MED ORDER — NALTREXONE HCL 50 MG PO TABS
25.0000 mg | ORAL_TABLET | Freq: Every day | ORAL | Status: DC
  Administered 2014-12-25: 25 mg via ORAL
  Filled 2014-12-25 (×2): qty 1

## 2014-12-25 MED ORDER — LISINOPRIL 5 MG PO TABS: 5.0000 mg | ORAL_TABLET | Freq: Every day | ORAL | Status: DC

## 2014-12-25 MED ORDER — NALTREXONE HCL 50 MG PO TABS: 25.0000 mg | ORAL_TABLET | Freq: Every day | ORAL | Status: DC

## 2014-12-25 MED ORDER — ALBUTEROL SULFATE HFA 108 (90 BASE) MCG/ACT IN AERS
2.0000 | INHALATION_SPRAY | RESPIRATORY_TRACT | Status: DC | PRN
Start: 2014-12-25 — End: 2015-01-21

## 2014-12-25 MED ORDER — LORAZEPAM 1 MG PO TABS: 1.0000 mg | ORAL_TABLET | Freq: Every day | ORAL | Status: DC

## 2014-12-25 MED ORDER — HYDROXYZINE HCL 50 MG PO TABS: 50.0000 mg | ORAL_TABLET | Freq: Four times a day (QID) | ORAL | Status: DC | PRN

## 2014-12-25 MED ORDER — FLUOXETINE HCL 10 MG PO CAPS: 10.0000 mg | ORAL_CAPSULE | Freq: Every day | ORAL | Status: DC

## 2014-12-25 MED ORDER — LORAZEPAM 1 MG PO TABS
1.0000 mg | ORAL_TABLET | Freq: Two times a day (BID) | ORAL | Status: DC
Start: 2014-12-25 — End: 2015-01-18

## 2014-12-25 NOTE — Progress Notes (Signed)
Patient ID: Ian PlowmanJonathan N Auman, male   DOB: 08/06/1980, 34 y.o.   MRN: [id number] D: Patient alert and cooperative. Pt detoxing from [facility] c/o anxiety. Pt mood/affect is anxious. Pt denies SI/HI/AVH and pain.   A: Medications administered as prescribed. Emotional support given and will continue to monitor pt's progress for stabilization.  R: Patient remains safe and complaint with medications.

## 2014-12-25 NOTE — Progress Notes (Signed)
  [facility] Discharge [facility] :  Will you be returning to the same living situation after discharge:  Yes,  home At discharge, do you have transportation home?: Yes,  car in parking lot Do you have the ability to pay for your medications: Yes,  private insurance  Release of information consent forms completed and submitted to medical records by CSW.   Patient to Follow up at: Follow-up Information    [facility] with [facility] On 01/14/2015.   Why:  Appt on this date with Vikki PortsValerie for medication management.    Contact information:   [address]  [place], [place] [address] Phone; [phone] Fax:       [facility] with [facility] On 12/28/2014.   Why:  Appt on this date at 5:00PM with Beth for individual therapy.    Contact information:   [address]  [place], [place] [address] Phone; [phone] Fax: [phone]      Next level of care provider has access to [facility] Link:no  Patient denies SI/HI: Yes,  during group/self report.    Safety Planning and Suicide Prevention discussed: Yes,  SPE completed with pt, as he refused to consent to family contact. SPI pamphlet and mobile crisis information provided to pt.   Have you used any form of tobacco in the last 30 days? (Cigarettes, Smokeless Tobacco, Cigars, and/or Pipes): No  Has patient been referred to the Quitline?: N/A patient is not a smoker  Patient has been referred for addiction treatment: Yes-see above.   Smart, Jden Want LCSW 12/25/2014, 9:47 AM

## 2014-12-25 NOTE — Discharge Summary (Signed)
Physician Discharge Summary Note  Patient:  Ian [name] is an 34 y.o., male  MRN:  [id number]  DOB:  Jan 04, 1981  Patient phone:  [phone] (home)   Patient address:   [address] [place] [address],   Total Time spent with patient: Greater than 30 minutes  Date of Admission:  12/23/2014  Date of Discharge: 12/25/2014  Reason for Admission: Alcohol use disorder, severe, dependence requiring detoxification treatments  Principal Problem: Alcohol use disorder, severe, dependence [facility])  Discharge Diagnoses: Patient Active Problem List   Diagnosis Date Noted  . Alcohol use disorder, severe, dependence (HCC) [F10.20] 08/23/2014  . Substance induced mood disorder (HCC) [F19.94] 08/23/2014  . Suicidal ideation [R45.851] 08/23/2014  . Pancreatitis [K85.9] 01/30/2014  . Acute pancreatitis [K85.90] 01/30/2014  . Abdominal pain [R10.9] 01/30/2014  . Nausea and vomiting [R11.2] 01/30/2014  . Hyponatremia [E87.1] 01/30/2014  . Leukocytosis [D72.829]   . Nicotine abuse [F19.10] 08/14/2013  . GAD (generalized anxiety disorder) [F41.1] 08/14/2013  . Major depressive disorder, recurrent episode, moderate (HCC) [F33.1] 08/14/2013  . ABDOMINAL PAIN OTHER SPECIFIED SITE [R10.9] 08/05/2009  . CHEST PAIN [R07.9] 01/19/2009  . CERUMEN IMPACTION [H61.20] 07/31/2008  . ASTHMA [J45.909] 07/31/2008   Musculoskeletal: Strength & Muscle Tone: within normal limits Gait & Station: normal Patient leans: N/A  Psychiatric Specialty Exam: Physical Exam  Vitals reviewed. Cardiovascular: Intact distal pulses.     Review of Systems  Constitutional: Negative.   HENT: Negative.   Eyes: Negative.   Respiratory: Negative.   Cardiovascular: Negative.   Gastrointestinal: Negative.   Genitourinary: Negative.   Musculoskeletal: Negative.   Skin: Negative.   Neurological: Negative.   Endo/Heme/Allergies: Negative.   Psychiatric/Behavioral: Positive for depression (Stable)  and substance abuse (Hx. Alcoholism, chronic). Negative for suicidal ideas, hallucinations and memory loss. The patient has insomnia (Stable). The patient is not nervous/anxious.   All other systems reviewed and are negative.   Blood pressure 126/95, pulse 88, temperature 97.7 F (36.5 C), temperature source Oral, resp. rate 20, height 5\' 10"  (1.778 m), weight 98.884 kg (218 lb), SpO2 97 %.Body mass index is 31.28 kg/(m^2).  See Md's SRA  Have you used any form of tobacco in the last 30 days? (Cigarettes, Smokeless Tobacco, Cigars, and/or Pipes): No  Has this patient used any form of tobacco in the last 30 days? (Cigarettes, Smokeless Tobacco, Cigars, and/or Pipes) N/A  Past Medical History:  Past Medical History  Diagnosis Date  . Anxiety   . Depression   . Hypertension   . Asthma   . Alcohol dependence (HCC)   . Pancreatitis     Past Surgical History  Procedure Laterality Date  . No past surgeries     Family History:  Family History  Problem Relation Age of Onset  . OCD Mother   . Suicidality Neg Hx    Social History:  History  Alcohol Use  . Yes    Comment: varies how much     History  Drug Use No    Social History   Social History  . Marital Status: Single    Spouse Name: N/A  . Number of Children: N/A  . Years of Education: N/A   Social History Main Topics  . Smoking status: Former Smoker    Types: Cigarettes    Quit date: 01/31/1999  . Smokeless tobacco: Never Used  . Alcohol Use: Yes     Comment: varies how much  . Drug Use: No  . Sexual Activity:  Yes    Birth Control/ Protection: Condom   Other Topics Concern  . None   Social History Narrative   Risk to Self: Is patient at risk for suicide?: Yes Risk to Others: No Prior Inpatient Therapy: Yes  Prior Outpatient Therapy: Yes  Level of Care:  OP  Hospital Course:   34 Y/o male who left the unit in August 17. He went home and back to work. Started drinking shortly after he left. States he  drinks a fifth to half a gallon almost every day except when he has his son. He states he saw his therapist who recommended that he should get on the Vivitrol. She also recommended that he gets inpatient detox.  Ian [name] was admitted to the hospital with his UDS test results positive Benzodiazepine & BAL of 360. His most recent lab results indicated elevated liver enzymes (AST & ALT), possibly from chronic alcoholism.He was presenting with substance withdrawal symptoms requiring detoxification treatments. He received Ativan detoxification treatment protocols. Besides the detoxification treatments, Ian [name] also was medicated & discharged on; Fluoxetine 10 mg for depression, Hydroxyzine 50 mg for anxiety, naltrexone 50 mg for alcoholism & Trazodone 150 mg for insomnia. He was resumed on all his pertinent home medications for all those medical issues. He tolerated his treatment Regimen without any adverse effects or reactions. He was enrolled & participated in the group counseling sessions being & held on this unit. He learned coping skills.  Ian [name] has completed detox treatments & his mood is stable. This is evidenced by his reports of improved mood & absence substance withdrawal symptoms. He is currently being discharged to continue psychiatric care, medication management & counseling services at the [facility] counseling-therapy center. He is provided with all the necessary information needed to make this appointment without problems. He left [facility] in no apparent distress. Transportation per patient;s arrangement.   Consults:  psychiatry  Discharge Vitals:   Blood pressure 126/95, pulse 88, temperature 97.7 F (36.5 C), temperature source Oral, resp. rate 20, height  (1.778 m), weight 98.884 kg (218 lb), SpO2 97 %. Body mass index is 31.28 kg/(m^2).  Lab Results:   Results for orders placed or performed during the hospital encounter of 12/23/14 (from the past 72 hour(s))  Hemoglobin A1c      Status: Abnormal   Collection Time: 12/24/14  6:35 AM  Result Value Ref Range   Hgb A1c MFr Bld 5.9 (H) 4.8 - 5.6 %    Comment: (NOTE)         Pre-diabetes: 5.7 - 6.4         Diabetes: >6.4         Glycemic control for adults with diabetes: <7.0    Mean Plasma Glucose 123 mg/dL    Comment: (NOTE) Performed At: [facility] [address] [place], [place] [id number] Mila Homer MD [id number] Performed at [facility]   Lipase, blood     Status: Abnormal   Collection Time: 12/24/14  6:35 AM  Result Value Ref Range   Lipase 129 (H) 11 - 51 U/L    Comment: Performed at [facility]   Physical Findings:  AIMS: Facial and Oral Movements Muscles of Facial Expression: None, normal Lips and Perioral Area: None, normal Jaw: None, normal Tongue: None, normal,Extremity Movements Upper (arms, wrists, hands, fingers): None, normal Lower (legs, knees, ankles, toes): None, normal, Trunk Movements Neck, shoulders, hips: None, normal, Overall Severity Severity of abnormal movements (highest score from questions above):  None, normal Incapacitation due to abnormal movements: None, normal Patient's awareness of abnormal movements (rate only patient's report): No Awareness, Dental Status Current problems with teeth and/or dentures?: No Does patient usually wear dentures?: No  CIWA:  CIWA-Ar Total: 1 COWS:     See Psychiatric Specialty Exam and Suicide Risk Assessment completed by Attending Physician prior to discharge.  Discharge destination:  Home  Is patient on multiple antipsychotic therapies at discharge:  No   Has Patient had three or more failed trials of antipsychotic monotherapy by history:  No  Recommended Plan for Multiple Antipsychotic Therapies: NA     Discharge Instructions    Discharge instructions    Complete by:  As directed   FYI for the outpatient provider: For Saadiq was started on Naltrexone 25 mg daily for  chronic alcoholism prior to discharge. If he is able to tolerate Naltrexone & his elevated liver enzymes stabilized, may start him on Vivitrol injections for chronic alcoholism.            Medication List    STOP taking these medications        acamprosate 333 MG tablet  Commonly known as:  CAMPRAL      TAKE these medications      Indication   albuterol 108 (90 BASE) MCG/ACT inhaler  Commonly known as:  PROVENTIL HFA;VENTOLIN HFA  Inhale 2 puffs into the lungs every 4 (four) hours as needed for wheezing or shortness of breath.   Indication:  Asthma     FLUoxetine 10 MG capsule  Commonly known as:  PROZAC  Take 1 capsule (10 mg total) by mouth daily. For depression   Indication:  Depression, Major Depressive Disorder     hydrOXYzine 50 MG tablet  Commonly known as:  ATARAX/VISTARIL  Take 1 tablet (50 mg total) by mouth every 6 (six) hours as needed for anxiety.   Indication:  Anxiety Neurosis     lisinopril 5 MG tablet  Commonly known as:  PRINIVIL,ZESTRIL  Take 1 tablet (5 mg total) by mouth daily. For high blood pressure   Indication:  High Blood Pressure, Hypertension associated with alcohol detox     LORazepam 1 MG tablet  Commonly known as:  ATIVAN  Take 1 tablet (1 mg total) by mouth 2 (two) times daily. For alcohol detox. Due 12-25-14 in the evening   Indication:  Alcohol detox     LORazepam 1 MG tablet  Commonly known as:  ATIVAN  Take 1 tablet (1 mg total) by mouth daily. Due 12-26-14 in am: For alcohol detox  Start taking on:  12/26/2014   Indication:  Alcohol Withdrawal Syndrome     naltrexone 50 MG tablet  Commonly known as:  DEPADE  Take 0.5 tablets (25 mg total) by mouth daily. For alcoholism   Indication:  Excessive Use of Alcohol     traZODone 150 MG tablet  Commonly known as:  DESYREL  Take 1 tablet (150 mg total) by mouth at bedtime. For sleep   Indication:  Trouble Sleeping       Follow-up Information    Follow up with [facility]  Counseling-Medication Management On 01/14/2015.   Why:  Appt on this date with Vikki Ports for medication management.    Contact information:   [address]  [place], [place] [address] Phone; [phone] Fax:       Follow up with [facility] Counseling-Therapy On 12/28/2014.   Why:  Appt on this date at 5:00PM with Beth for individual therapy.  Contact information:   [address]  [place], [place] [address] Phone; [phone] Fax: [phone]     Follow-up recommendations: Activity:  As tolerated Diet: As recommended by your primary care doctor. Keep all scheduled follow-up appointments as recommended.   Comments: Take all your medications as prescribed by your mental healthcare provider. Report any adverse effects and or reactions from your medicines to your outpatient provider promptly. Patient is instructed and cautioned to not engage in alcohol and or illegal drug use while on prescription medicines. In the event of worsening symptoms, patient is instructed to call the crisis hotline, 911 and or go to the nearest ED for appropriate evaluation and treatment of symptoms. Follow-up with your primary care provider for your other medical issues, concerns and or health care needs.     Total Discharge Time: Greater than 30 minutes  Signed: Armandina StammerNwoko, Cassady Stanczak I PMHNP, FNP-BC 12/25/2014, 10:46 AM  I

## 2014-12-25 NOTE — [facility] Suicide Risk Assessment (Signed)
[facility]   Demographic Factors:  Male and Caucasian  Total Time spent with patient: 30 minutes  Musculoskeletal: Strength & Muscle Tone: within normal limits Gait & Station: normal Patient leans: N/A  Psychiatric Specialty Exam: Physical Exam  Review of Systems  Psychiatric/Behavioral: Positive for substance abuse. The patient is nervous/anxious.   All other systems reviewed and are negative.   Blood pressure 126/95, pulse 88, temperature 97.7 F (36.5 C), temperature source Oral, resp. rate 20, height 5\' 10"  (1.778 m), weight 98.884 kg (218 lb), SpO2 97 %.Body mass index is 31.28 kg/(m^2).  General Appearance: Casual  Eye Contact::  Fair  Speech:  Clear and Coherent409  Volume:  Normal  Mood:  Anxious improving  Affect:  Appropriate  Thought Process:  Coherent  Orientation:  Full (Time, Place, and Person)  Thought Content:  WDL  Suicidal Thoughts:  No  Homicidal Thoughts:  No  Memory:  Immediate;   Fair Recent;   Fair Remote;   Fair  Judgement:  Fair  Insight:  Fair  Psychomotor Activity:  Normal  Concentration:  Fair  Recall:  FiservFair  Fund of Knowledge:Fair  Language: Fair  Akathisia:  No  Handed:  Right  AIMS (if indicated):     Assets:  Communication Skills Desire for Improvement  Sleep:  Number of Hours: 4.75  Cognition: WNL  ADL's:  Intact   Have you used any form of tobacco in the last 30 days? (Cigarettes, Smokeless Tobacco, Cigars, and/or Pipes): No  Has this patient used any form of tobacco in the last 30 days? (Cigarettes, Smokeless Tobacco, Cigars, and/or Pipes) No  Mental Status Per Nursing Assessment::   On Admission:  NA  Current Mental Status by Physician: pt denies SI/HI/AH/VH  Loss Factors: NA  Historical Factors: Impulsivity  Risk Reduction Factors:   Positive social support  Continued Clinical Symptoms:  Alcohol/Substance Abuse/Dependencies  Cognitive Features That Contribute To Risk:  Polarized  thinking    Suicide Risk:  Minimal: No identifiable suicidal ideation.  Patients presenting with no risk factors but with morbid ruminations; may be classified as minimal risk based on the severity of the depressive symptoms  Principal Problem: Alcohol use disorder, severe, dependence [facility](HCC) Discharge Diagnoses:  Patient Active Problem List   Diagnosis Date Noted  . Alcohol use disorder, severe, dependence (HCC) [F10.20] 08/23/2014  . Substance induced mood disorder (HCC) [F19.94] 08/23/2014  . Suicidal ideation [R45.851] 08/23/2014  . Pancreatitis [K85.9] 01/30/2014  . Acute pancreatitis [K85.90] 01/30/2014  . Abdominal pain [R10.9] 01/30/2014  . Nausea and vomiting [R11.2] 01/30/2014  . Hyponatremia [E87.1] 01/30/2014  . Leukocytosis [D72.829]   . Nicotine abuse [F19.10] 08/14/2013  . GAD (generalized anxiety disorder) [F41.1] 08/14/2013  . Major depressive disorder, recurrent episode, moderate (HCC) [F33.1] 08/14/2013  . ABDOMINAL PAIN OTHER SPECIFIED SITE [R10.9] 08/05/2009  . CHEST PAIN [R07.9] 01/19/2009  . CERUMEN IMPACTION [H61.20] 07/31/2008  . ASTHMA [J45.909] 07/31/2008    Follow-up Information    [facility] On 01/14/2015.   Why:  Appt on this date with Vikki PortsValerie for medication management.    Contact information:   [address]  [place], [place] [address] Phone; [phone] Fax:       [facility] On 12/28/2014.   Why:  Appt on this date at 5:00PM with Beth for individual therapy.    Contact information:   [address]  [place], [place] [address] Phone; [phone] Fax: [phone]  Plan Of Care/Follow-up recommendations:  Activity:  NO RESTRICTIONS Diet:  REGULAR Tests:  AS NEEDED AS PER OUT PT RECOMMENDATIONS Other:  NONE  Is patient on multiple antipsychotic therapies at discharge:  No   Has Patient had three or more failed trials of antipsychotic monotherapy by  history:  No  Recommended Plan for Multiple Antipsychotic Therapies: NA    Dannae Kato md 12/25/2014, 9:38 AM

## 2014-12-25 NOTE — Tx Team (Signed)
Interdisciplinary Treatment Plan Update (Adult)  Date:  12/25/2014  Time Reviewed:  10:09 AM   Progress in Treatment: Attending groups: Yes. Participating in groups:  Yes. Taking medication as prescribed:  Yes. Tolerating medication:  Yes. Family/Significant othe contact made:  SPE completed with pt, as he refused to consent to family contact.  Patient understands diagnosis:  Yes. and As evidenced by:  seeking treatment for ETOH detox, depression, SI, and for medication stabilization. Discussing patient identified problems/goals with staff:  Yes. Medical problems stabilized or resolved:  Yes. Denies suicidal/homicidal ideation: Yes. Issues/concerns per patient self-inventory:  Other:  Discharge Plan or Barriers: Pt plans to return home and continue med management and Counseling at [facility]. Pt given Mental Health Association information and AA list for [facility] as well.   Reason for Continuation of Hospitalization: none  Comments:  DEMITRIS [name] is an 34 y.o. male. who presents to [facility] ER with the presenting problem of suicidal ideations and excessive alcohol consumption. He stated he is currently having suicidal thoughts. He reports that his alcohol use has strained his relationship with others and that his parents see him as a disappointment. Patient states that he has not attempted suicide in the past but has received inpatient treatment for anxiety, depression, and alcoholism in 2016 and 2013 at [facility]. Patient reports depressive symptoms that include: insomnia, guilt, feelings of angry/irritability, and fatigue. He shares that he consumes alcohol daily up to 1.5 gallons of vodka, with first age of use at or around 12, and last reported use on 12/22/14 with 1/4 gallon vodka consumption. Patient reports that he currently sees [facility] for outpatient services, and reports that his psychiatrist is Dr. Gerome Apley, and that his therapist is Eustaquio Maize.  Patient lives by self. Patient denies history or present AVH, but acknowledges AVH when drinking excessive amounts of alcohol followed by paranoia of people coming to shoot him.Diagnosis: 296.30 [F33.9] Major Depressive Disorder, Recurrent Episode, Unspecified; 300.00 [F41.9] Unspecified Anxiety Disorder; 303.90 [F10.20] Alcohol use Disorder, Severe  Estimated length of stay:  D/c today   Additional Comments:  Patient and CSW reviewed pt's identified goals and treatment plan. Patient verbalized understanding and agreed to treatment plan. CSW reviewed [facility] "Discharge Process and Patient Involvement" Form. Pt verbalized understanding of information provided and signed form.    Review of initial/current patient goals per problem list:  1. Goal(s): Patient will participate in aftercare plan  Met: Yes  Target date: at discharge  As evidenced by: Patient will participate within aftercare plan AEB aftercare provider and housing plan at discharge being identified.  12/15: Pt plans to return home at d/c and will continue to see his providers at [facility].   2. Goal (s): Patient will exhibit decreased depressive symptoms and suicidal ideations.  Met: Yes.    Target date: at discharge  As evidenced by: Patient will utilize self rating of depression at 3 or below and demonstrate decreased signs of depression or be deemed stable for discharge by MD.  12/15: Pt rates depression as 7/10 and presents with depressed mood/flat affect. Pt denies SI/HI/AVH.  12/16: Pt rates depression as 2/10 and presents with pleasant mood/calm affect.    3. Goal(s): Patient will demonstrate decreased signs of withdrawal due to substance abuse  Met:No.  Target date:at discharge   As evidenced by: Patient will produce a CIWA/COWS score of 0, have stable vitals signs, and no symptoms of withdrawal.   12/15: Pt reports moderate withdrawals with CIWA score of 4  and high BP.    12/16: Pt  reports no signs of withdrawal with CIWA of 1 and high BP. Per MD, pt is medically stable for d/c today.   Attendees: Patient:   12/25/2014 10:09 AM   Family:   12/25/2014 10:09 AM   Physician:  Dr. Larene Beach. Shea Evans Md  12/25/2014 10:09 AM   Nursing:  Tobe Sos RN 12/25/2014 10:09 AM   Clinical Social Worker: Maxie Better, LCSW 12/25/2014 10:09 AM   Clinical Social Worker: ; Peri Maris LCSWA 12/25/2014 10:09 AM   Other:  Gerline Legacy Nurse Case Manager 12/25/2014 10:09 AM   Other: 12/25/2014 10:09 AM   Other:   12/25/2014 10:09 AM   Other:  12/25/2014 10:09 AM   Other:  12/25/2014 10:09 AM   Other:  12/25/2014 10:09 AM    12/25/2014 10:09 AM    12/25/2014 10:09 AM    12/25/2014 10:09 AM    12/25/2014 10:09 AM    Scribe for Treatment Team:   Maxie Better, LCSW 12/25/2014 10:09 AM

## 2014-12-25 NOTE — Plan of Care (Signed)
Problem: Alteration in mood & ability to function due to Goal: STG-Patient will attend groups Outcome: Progressing Pt attended evening karaoke group.     

## 2014-12-25 NOTE — Progress Notes (Signed)
D) Pt being discharged to home. Driving his own car. Affect is brighter. Denies SI and HI.  A) All belongings returned to Pt. All discharge plans explained to Pt along with his discharged medications.  R) Denies SI and HI.

## 2015-01-15 ENCOUNTER — Encounter (HOSPITAL_COMMUNITY): Payer: Self-pay | Admitting: Licensed Clinical Social Worker

## 2015-01-17 ENCOUNTER — Emergency Department (HOSPITAL_COMMUNITY)
Admission: EM | Admit: 2015-01-17 | Discharge: 2015-01-18 | Disposition: A | Discharge status: Other Acute Inpatient Hospital | Payer: BLUE CROSS/BLUE SHIELD | Attending: Emergency Medicine | Admitting: Emergency Medicine

## 2015-01-17 ENCOUNTER — Encounter (HOSPITAL_COMMUNITY): Payer: Self-pay | Admitting: Emergency Medicine

## 2015-01-17 DIAGNOSIS — Z79899 Other long term (current) drug therapy: Secondary | ICD-10-CM | POA: Insufficient documentation

## 2015-01-17 DIAGNOSIS — I10 Essential (primary) hypertension: Secondary | ICD-10-CM | POA: Insufficient documentation

## 2015-01-17 DIAGNOSIS — F419 Anxiety disorder, unspecified: Secondary | ICD-10-CM | POA: Diagnosis not present

## 2015-01-17 DIAGNOSIS — F1314 Sedative, hypnotic or anxiolytic abuse with sedative, hypnotic or anxiolytic-induced mood disorder: Secondary | ICD-10-CM | POA: Diagnosis not present

## 2015-01-17 DIAGNOSIS — Z8719 Personal history of other diseases of the digestive system: Secondary | ICD-10-CM | POA: Insufficient documentation

## 2015-01-17 DIAGNOSIS — R45851 Suicidal ideations: Secondary | ICD-10-CM | POA: Insufficient documentation

## 2015-01-17 DIAGNOSIS — Z87891 Personal history of nicotine dependence: Secondary | ICD-10-CM | POA: Insufficient documentation

## 2015-01-17 DIAGNOSIS — F1994 Other psychoactive substance use, unspecified with psychoactive substance-induced mood disorder: Secondary | ICD-10-CM

## 2015-01-17 DIAGNOSIS — G478 Other sleep disorders: Secondary | ICD-10-CM | POA: Insufficient documentation

## 2015-01-17 DIAGNOSIS — F315 Bipolar disorder, current episode depressed, severe, with psychotic features: Secondary | ICD-10-CM | POA: Insufficient documentation

## 2015-01-17 DIAGNOSIS — J45909 Unspecified asthma, uncomplicated: Secondary | ICD-10-CM | POA: Insufficient documentation

## 2015-01-17 DIAGNOSIS — F1020 Alcohol dependence, uncomplicated: Secondary | ICD-10-CM | POA: Insufficient documentation

## 2015-01-17 DIAGNOSIS — R441 Visual hallucinations: Secondary | ICD-10-CM | POA: Diagnosis present

## 2015-01-17 LAB — ETHANOL: Alcohol, Ethyl (B): <5 mg/dL (ref <5)

## 2015-01-17 LAB — COMPREHENSIVE METABOLIC PANEL
ALT: 42 U/L (ref 17–63)
AST: 33 U/L (ref 15–41)
Albumin: 4.3 g/dL (ref 3.5–5.0)
Alkaline Phosphatase: 72 U/L (ref 38–126)
Anion gap: 9 (ref 5–15)
BUN: 11 mg/dL (ref 6–20)
CHLORIDE: 106 mmol/L (ref 101–111)
CO2: 24 mmol/L (ref 22–32)
CREATININE: 0.86 mg/dL (ref 0.61–1.24)
Calcium: 9 mg/dL (ref 8.9–10.3)
GFR calc Af Amer: >60 mL/min (ref >60)
GLUCOSE: 107 mg/dL — AB (ref 65–99)
Potassium: 3.6 mmol/L (ref 3.5–5.1)
SODIUM: 139 mmol/L (ref 135–145)
Total Bilirubin: 1.2 mg/dL (ref 0.3–1.2)
Total Protein: 7.6 g/dL (ref 6.5–8.1)

## 2015-01-17 LAB — RAPID URINE DRUG SCREEN, HOSP PERFORMED
Amphetamines: NOT DETECTED
Barbiturates: NOT DETECTED
Benzodiazepines: POSITIVE — AB
Cocaine: NOT DETECTED
OPIATES: NOT DETECTED
TETRAHYDROCANNABINOL: NOT DETECTED

## 2015-01-17 LAB — CBC WITH DIFFERENTIAL/PLATELET
Basophils Absolute: 0.1 10*3/uL (ref 0.0–0.1)
Basophils Relative: 1 %
EOS PCT: 7 %
Eosinophils Absolute: 0.6 10*3/uL (ref 0.0–0.7)
HCT: 38.7 % — ABNORMAL LOW (ref 39.0–52.0)
Hemoglobin: 13.6 g/dL (ref 13.0–17.0)
LYMPHS ABS: 3.1 10*3/uL (ref 0.7–4.0)
LYMPHS PCT: 38 %
MCH: 32.3 pg (ref 26.0–34.0)
MCHC: 35.1 g/dL (ref 30.0–36.0)
MCV: 91.9 fL (ref 78.0–100.0)
MONO ABS: 0.6 10*3/uL (ref 0.1–1.0)
MONOS PCT: 8 %
Neutro Abs: 3.9 10*3/uL (ref 1.7–7.7)
Neutrophils Relative %: 46 %
PLATELETS: 139 10*3/uL — AB (ref 150–400)
RBC: 4.21 MIL/uL — ABNORMAL LOW (ref 4.22–5.81)
RDW: 14 % (ref 11.5–15.5)
WBC: 8.3 10*3/uL (ref 4.0–10.5)

## 2015-01-17 MED ORDER — FLUOXETINE HCL 10 MG PO CAPS
10.0000 mg | ORAL_CAPSULE | Freq: Every day | ORAL | Status: DC
  Administered 2015-01-18: 10 mg via ORAL
  Filled 2015-01-17: qty 1

## 2015-01-17 MED ORDER — VITAMIN B-1 100 MG PO TABS
100.0000 mg | ORAL_TABLET | Freq: Every day | ORAL | Status: DC
  Administered 2015-01-17 – 2015-01-18 (×2): 100 mg via ORAL
  Filled 2015-01-17 (×2): qty 1

## 2015-01-17 MED ORDER — TRAZODONE HCL 50 MG PO TABS: 150.0000 mg | ORAL_TABLET | Freq: Every day | ORAL | Status: DC

## 2015-01-17 MED ORDER — LORAZEPAM 1 MG PO TABS
0.0000 mg | ORAL_TABLET | Freq: Four times a day (QID) | ORAL | Status: DC
  Administered 2015-01-17: 2 mg via ORAL
  Filled 2015-01-17: qty 2

## 2015-01-17 MED ORDER — HYDROXYZINE HCL 25 MG PO TABS
50.0000 mg | ORAL_TABLET | Freq: Four times a day (QID) | ORAL | Status: DC | PRN
  Administered 2015-01-17 – 2015-01-18 (×2): 50 mg via ORAL
  Filled 2015-01-17 (×2): qty 2

## 2015-01-17 MED ORDER — NALTREXONE HCL 50 MG PO TABS
25.0000 mg | ORAL_TABLET | Freq: Every day | ORAL | Status: DC
  Administered 2015-01-18: 25 mg via ORAL
  Filled 2015-01-17: qty 1

## 2015-01-17 MED ORDER — ACAMPROSATE CALCIUM 333 MG PO TBEC
666.0000 mg | DELAYED_RELEASE_TABLET | Freq: Three times a day (TID) | ORAL | Status: DC
  Administered 2015-01-17 – 2015-01-18 (×6): 666 mg via ORAL
  Filled 2015-01-17 (×7): qty 2

## 2015-01-17 MED ORDER — THIAMINE HCL 100 MG/ML IJ SOLN: 100.0000 mg | Freq: Every day | INTRAMUSCULAR | Status: DC

## 2015-01-17 MED ORDER — ALBUTEROL SULFATE HFA 108 (90 BASE) MCG/ACT IN AERS: 2.0000 | INHALATION_SPRAY | RESPIRATORY_TRACT | Status: DC | PRN

## 2015-01-17 MED ORDER — LISINOPRIL 5 MG PO TABS: 5.0000 mg | ORAL_TABLET | Freq: Every day | ORAL | Status: DC

## 2015-01-17 MED ORDER — LISINOPRIL 5 MG PO TABS
5.0000 mg | ORAL_TABLET | Freq: Every day | ORAL | Status: DC
  Administered 2015-01-18: 5 mg via ORAL
  Filled 2015-01-17: qty 1

## 2015-01-17 MED ORDER — GABAPENTIN 300 MG PO CAPS
600.0000 mg | ORAL_CAPSULE | Freq: Every day | ORAL | Status: DC
Start: 2015-01-17 — End: 2015-01-18
  Administered 2015-01-17: 600 mg via ORAL
  Filled 2015-01-17: qty 2

## 2015-01-17 MED ORDER — ASENAPINE MALEATE 5 MG SL SUBL
10.0000 mg | SUBLINGUAL_TABLET | Freq: Two times a day (BID) | SUBLINGUAL | Status: DC
  Administered 2015-01-17 – 2015-01-18 (×3): 10 mg via SUBLINGUAL
  Filled 2015-01-17 (×4): qty 2

## 2015-01-17 MED ORDER — LORAZEPAM 1 MG PO TABS: 0.0000 mg | ORAL_TABLET | Freq: Two times a day (BID) | ORAL | Status: DC

## 2015-01-17 NOTE — ED Notes (Signed)
Report received from Lizbeth BarkJanie Rambo RN. Pt. Alert and oriented in no acute distress denies SI, HI, AVH and pain.  Pt. Obviously responding to internal stimuli, walking around unit picking at things and searching for something. Pt. Is however redirectable and cooperative. Pt. Instructed to come to me with problems or concerns.Will continue to monitor for safety via security cameras and Q 15 minute checks.

## 2015-01-17 NOTE — Consult Note (Signed)
[facility] Face-to-Face Psychiatry Consult   Reason for Consult:  Hallucinations Referring Physician:  EDP Patient Identification: Ian [name] MRN:  [id number] Principal Diagnosis: Bipolar disorder, curr episode depressed, severe, w/psychotic features [facility]) Diagnosis:   Patient Active Problem List   Diagnosis Date Noted  . Bipolar disorder, curr episode depressed, severe, w/psychotic features ([place]) [F31.5] 01/17/2015    Priority: High  . Alcohol use disorder, severe, dependence ([place]) [F10.20] 08/23/2014  . Substance induced mood disorder ([place]) [F19.94] 08/23/2014  . Suicidal ideation [R45.851] 08/23/2014  . Pancreatitis [K85.9] 01/30/2014  . Acute pancreatitis [K85.90] 01/30/2014  . Abdominal pain [R10.9] 01/30/2014  . Nausea and vomiting [R11.2] 01/30/2014  . Hyponatremia [E87.1] 01/30/2014  . Leukocytosis [D72.829]   . Nicotine abuse [F19.10] 08/14/2013  . GAD (generalized anxiety disorder) [F41.1] 08/14/2013  . Major depressive disorder, recurrent episode, moderate ([place]) [F33.1] 08/14/2013  . ABDOMINAL PAIN OTHER SPECIFIED SITE [R10.9] 08/05/2009  . CHEST PAIN [R07.9] 01/19/2009  . CERUMEN IMPACTION [H61.20] 07/31/2008  . ASTHMA [J45.909] 07/31/2008    Total Time spent with patient: 45 minutes  Subjective:   Ian [name] is a 35 y.o. male patient admitted with psychosis.  HPI:  On admission: 35 y.o. male. who presents to [facility] ER with the presenting problem of suicidal ideations and excessive alcohol consumption. He stated he is currently having suicidal thoughts. He reports that his alcohol use has strained his relationship with others and that his parents see him as a disappointment.   Patient states that he has not attempted suicide in the past but has received inpatient treatment for anxiety, depression, and alcoholism in 2016 and 2013 at [facility]. Patient reports depressive symptoms that include: insomnia, guilt, feelings of angry/irritability, and fatigue. He shares  that he consumes alcohol daily up to 1.5 gallons of vodka, with first age of use at or around 13, and last reported use on 12/22/14 with 1/4 gallon vodka consumption.   Patient reports that he currently sees [facility] for outpatient services, and reports that his psychiatrist is Dr. Gerome Apley, and that his therapist is Eustaquio Maize. Patient lives by self. Patient denies history or present AVH, but acknowledges AVH when drinking excessive amounts of alcohol followed by paranoia of people coming to shoot him.  Today:  Patient continues to respond to internal stimuli, hearing voices and seeing things for the mast month, gradual onset.  He denies recent drug or alcohol abuse.  Reports no sleep for the past two weeks.  Rambling and disorganized at times.  No suicidal/homicidal ideations.  Past Psychiatric History: alcohol dependence, depression, anxiety  Risk to Self: Is patient at risk for suicide?: No Risk to Others:   Prior Inpatient Therapy:   Prior Outpatient Therapy:    Past Medical History:  Past Medical History  Diagnosis Date  . Anxiety   . Depression   . Hypertension   . Asthma   . Alcohol dependence ([place])   . Pancreatitis     Past Surgical History  Procedure Laterality Date  . No past surgeries     Family History:  Family History  Problem Relation Age of Onset  . OCD Mother   . Suicidality Neg Hx    Family Psychiatric  History: None Social History:  History  Alcohol Use  . Yes    Comment: varies how much     History  Drug Use No    Social History   Social History  . Marital Status: Single    Spouse Name: N/A  .  Number of Children: N/A  . Years of Education: N/A   Social History Main Topics  . Smoking status: Former Smoker    Types: Cigarettes    Quit date: 01/31/1999  . Smokeless tobacco: Never Used  . Alcohol Use: Yes     Comment: varies how much  . Drug Use: No  . Sexual Activity: Yes    Birth Control/ Protection: Condom   Other Topics  Concern  . None   Social History Narrative   Additional Social History:    Pain Medications: see chart Prescriptions: see chart Over the Counter: see chart History of alcohol / drug use?: Yes Longest period of sobriety (when/how long): 1 week Negative Consequences of Use: Financial, Personal relationships, Work / Youth worker Withdrawal Symptoms: Sweats, Diarrhea, DTs, Tingling, Irritability, Tremors, Weakness, Nausea / Vomiting Name of Substance 1: alcohol 1 - Amount (size/oz): varies 1 - Frequency: daily 1 - Last Use / Amount: 1 half week                   Allergies:  No Known Allergies  Labs:  Results for orders placed or performed during the hospital encounter of 01/17/15 (from the past 48 hour(s))  Comprehensive metabolic panel     Status: Abnormal   Collection Time: 01/17/15  7:56 AM  Result Value Ref Range   Sodium 139 135 - 145 mmol/L   Potassium 3.6 3.5 - 5.1 mmol/L   Chloride 106 101 - 111 mmol/L   CO2 24 22 - 32 mmol/L   Glucose, Bld 107 (H) 65 - 99 mg/dL   BUN 11 6 - 20 mg/dL   Creatinine, Ser 0.86 0.61 - 1.24 mg/dL   Calcium 9.0 8.9 - 10.3 mg/dL   Total Protein 7.6 6.5 - 8.1 g/dL   Albumin 4.3 3.5 - 5.0 g/dL   AST 33 15 - 41 U/L   ALT 42 17 - 63 U/L   Alkaline Phosphatase 72 38 - 126 U/L   Total Bilirubin 1.2 0.3 - 1.2 mg/dL   GFR calc non Af Amer >60 >60 mL/min   GFR calc Af Amer >60 >60 mL/min    Comment: (NOTE) The eGFR has been calculated using the CKD EPI equation. This calculation has not been validated in all clinical situations. eGFR's persistently <60 mL/min signify possible Chronic Kidney Disease.    Anion gap 9 5 - 15  Ethanol     Status: None   Collection Time: 01/17/15  7:56 AM  Result Value Ref Range   Alcohol, Ethyl (B) <5 <5 mg/dL    Comment:        LOWEST DETECTABLE LIMIT FOR SERUM ALCOHOL IS 5 mg/dL FOR MEDICAL PURPOSES ONLY   CBC with Diff     Status: Abnormal   Collection Time: 01/17/15  7:56 AM  Result Value Ref Range    WBC 8.3 4.0 - 10.5 K/uL   RBC 4.21 (L) 4.22 - 5.81 MIL/uL   Hemoglobin 13.6 13.0 - 17.0 g/dL   HCT 38.7 (L) 39.0 - 52.0 %   MCV 91.9 78.0 - 100.0 fL   MCH 32.3 26.0 - 34.0 pg   MCHC 35.1 30.0 - 36.0 g/dL   RDW 14.0 11.5 - 15.5 %   Platelets 139 (L) 150 - 400 K/uL   Neutrophils Relative % 46 %   Neutro Abs 3.9 1.7 - 7.7 K/uL   Lymphocytes Relative 38 %   Lymphs Abs 3.1 0.7 - 4.0 K/uL   Monocytes Relative 8 %  Monocytes Absolute 0.6 0.1 - 1.0 K/uL   Eosinophils Relative 7 %   Eosinophils Absolute 0.6 0.0 - 0.7 K/uL   Basophils Relative 1 %   Basophils Absolute 0.1 0.0 - 0.1 K/uL  Urine rapid drug screen (hosp performed)not at [facility]     Status: Abnormal   Collection Time: 01/17/15 10:39 AM  Result Value Ref Range   Opiates NONE DETECTED NONE DETECTED   Cocaine NONE DETECTED NONE DETECTED   Benzodiazepines POSITIVE (A) NONE DETECTED   Amphetamines NONE DETECTED NONE DETECTED   Tetrahydrocannabinol NONE DETECTED NONE DETECTED   Barbiturates NONE DETECTED NONE DETECTED    Comment:        DRUG SCREEN FOR MEDICAL PURPOSES ONLY.  IF CONFIRMATION IS NEEDED FOR ANY PURPOSE, NOTIFY LAB WITHIN 5 DAYS.        LOWEST DETECTABLE LIMITS FOR URINE DRUG SCREEN Drug Class       Cutoff (ng/mL) Amphetamine      1000 Barbiturate      200 Benzodiazepine   200 Tricyclics       300 Opiates          300 Cocaine          300 THC              50     Current Facility-Administered Medications  Medication Dose Route Frequency Provider Last Rate Last Dose  . acamprosate (CAMPRAL) tablet 666 mg  666 mg Oral TID WC Gilda Crease, MD   666 mg at 01/17/15 0909  . albuterol (PROVENTIL HFA;VENTOLIN HFA) 108 (90 Base) MCG/ACT inhaler 2 puff  2 puff Inhalation Q4H PRN Gilda Crease, MD      . asenapine (SAPHRIS) sublingual tablet 10 mg  10 mg Sublingual BID Endre Coutts      . [START ON 01/18/2015] FLUoxetine (PROZAC) capsule 10 mg  10 mg Oral Daily Gilda Crease, MD      .  gabapentin (NEURONTIN) capsule 600 mg  600 mg Oral QHS Jannet Calip      . hydrOXYzine (ATARAX/VISTARIL) tablet 50 mg  50 mg Oral Q6H PRN Gilda Crease, MD      . Melene Muller ON 01/18/2015] lisinopril (PRINIVIL,ZESTRIL) tablet 5 mg  5 mg Oral Daily Gilda Crease, MD      . LORazepam (ATIVAN) tablet 0-4 mg  0-4 mg Oral 4 times per day Gilda Crease, MD   2 mg at 01/17/15 0703   Followed by  . [START ON 01/19/2015] LORazepam (ATIVAN) tablet 0-4 mg  0-4 mg Oral Q12H Gilda Crease, MD      . Melene Muller ON 01/18/2015] naltrexone (DEPADE) tablet 25 mg  25 mg Oral Daily Gilda Crease, MD      . thiamine (VITAMIN B-1) tablet 100 mg  100 mg Oral Daily Gilda Crease, MD   100 mg at 01/17/15 1119   Or  . thiamine (B-1) injection 100 mg  100 mg Intravenous Daily Gilda Crease, MD       Current Outpatient Prescriptions  Medication Sig Dispense Refill  . acamprosate (CAMPRAL) 333 MG tablet Take 666 mg by mouth 3 (three) times daily with meals.    Marland Kitchen albuterol (PROVENTIL HFA;VENTOLIN HFA) 108 (90 BASE) MCG/ACT inhaler Inhale 2 puffs into the lungs every 4 (four) hours as needed for wheezing or shortness of breath.    Marland Kitchen FLUoxetine (PROZAC) 10 MG capsule Take 1 capsule (10 mg total) by mouth daily. For depression 30 capsule  0  . hydrOXYzine (ATARAX/VISTARIL) 50 MG tablet Take 1 tablet (50 mg total) by mouth every 6 (six) hours as needed for anxiety. 45 tablet 0  . lisinopril (PRINIVIL,ZESTRIL) 5 MG tablet Take 5 mg by mouth daily.    . naltrexone (DEPADE) 50 MG tablet Take 0.5 tablets (25 mg total) by mouth daily. For alcoholism 30 tablet 0  . traZODone (DESYREL) 150 MG tablet Take 1 tablet (150 mg total) by mouth at bedtime. For sleep 30 tablet 0  . lisinopril (PRINIVIL,ZESTRIL) 5 MG tablet Take 1 tablet (5 mg total) by mouth daily. For high blood pressure 30 tablet 0  . LORazepam (ATIVAN) 1 MG tablet Take 1 tablet (1 mg total) by mouth 2 (two) times daily. For  alcohol detox. Due 12-25-14 in the evening 1 tablet 0  . LORazepam (ATIVAN) 1 MG tablet Take 1 tablet (1 mg total) by mouth daily. Due 12-26-14 in am: For alcohol detox 1 tablet 0    Musculoskeletal: Strength & Muscle Tone: within normal limits Gait & Station: normal Patient leans: N/A  Psychiatric Specialty Exam: Review of Systems  Constitutional: Negative.   HENT: Negative.   Eyes: Negative.   Respiratory: Negative.   Cardiovascular: Negative.   Gastrointestinal: Negative.   Genitourinary: Negative.   Musculoskeletal: Negative.   Skin: Negative.   Neurological: Negative.   Endo/Heme/Allergies: Negative.   Psychiatric/Behavioral: Positive for hallucinations. The patient is nervous/anxious and has insomnia.     Blood pressure 123/90, pulse 95, temperature 97.7 F (36.5 C), temperature source Oral, resp. rate 20, SpO2 98 %.There is no weight on file to calculate BMI.  General Appearance: Disheveled  Eye Sport and exercise psychologist::  Fair  Speech:  Normal Rate  Volume:  Normal  Mood:  Euphoric  Affect:  Congruent  Thought Process:  Disorganized and Tangential  Orientation:  Full (Time, Place, and Person)  Thought Content:  Hallucinations: Auditory Visual  Suicidal Thoughts:  No  Homicidal Thoughts:  No  Memory:  Immediate;   Fair Recent;   Fair Remote;   Fair  Judgement:  Impaired  Insight:  Fair  Psychomotor Activity:  Increased  Concentration:  Fair  Recall:  [place] of Knowledge:Good  Language: Good  Akathisia:  No  Handed:  Right  AIMS (if indicated):     Assets:  Housing Leisure Time Physical Health Resilience Social Support  ADL's:  Intact  Cognition: Impaired,  Mild  Sleep:      Treatment Plan Summary: Daily contact with patient to assess and evaluate symptoms and progress in treatment, Medication management and Plan bipolar affective disorder, most recent episode manic, severe with psychotic features: -Crisis stabilization -Medication management:  Gabapentin 600  mg at bedtime for mood and anxiety, Campral 666 mg TID and Naltrexone 25 mg daily for alcohol cravings, Vistaril 50 mg every 6 hours PRN anxiety, Prozac 10 mg daily for depression, and Saphris 10 mg BID for psychosis and sleep started  -Individual counseling  Disposition: Recommend psychiatric Inpatient admission when medically cleared.  Waylan Boga, [place] 01/17/2015 11:50 AM Patient seen face-to-face for psychiatric evaluation, chart reviewed and case discussed with the physician extender and developed treatment plan. Reviewed the information documented and agree with the treatment plan. Corena Pilgrim, MD

## 2015-01-17 NOTE — ED Notes (Signed)
MD at bedside. 

## 2015-01-17 NOTE — ED Notes (Signed)
Pt in dayroom, pleasant, oriented x3, but seems confused at times.  NAD, procedures explained

## 2015-01-17 NOTE — ED Notes (Signed)
Up in hall insisting that he is going to leave "I have 5 people waiting for me in the waiting room"...  Pt sts that the blanket and pillow came over from the "wall" nad was pointing at the doors.  Dr A aware, pt will be IVC'd

## 2015-01-17 NOTE — ED Notes (Signed)
Pt up in the hall/room to get paper towels for "the people" that are in his room, pleasant, reports that he does not know who the people are.

## 2015-01-17 NOTE — ED Notes (Signed)
Patients mother called and reports that the patient needs to take a court ordered breathalizer at 12:00p and 6:00p today.  Pt has the machine in his belongings.

## 2015-01-17 NOTE — ED Notes (Signed)
Up in hall/room walking, restless.  Pt climbed on the chair the to the bed.  Pt instructed not to climb on the furniture and verbalized understanding.  Asking about when psych will be in.

## 2015-01-17 NOTE — ED Provider Notes (Signed)
CSN: [id number]     Arrival date & time 01/17/15  0500 History   First MD Initiated Contact with Patient 01/17/15 [phone]     Chief Complaint  Patient presents with  . Hallucinations     (Consider location/radiation/quality/duration/timing/severity/associated sxs/prior Treatment) HPI Comments: Patient presents to the emergency department for evaluation of severe anxiety with hallucinations. Patient does have extensive psychiatric history. Patient reports that over the last couple of weeks he has been experiencing progressively worsening anxiety and also has been experiencing auditory and visual hallucinations. Patient reports that he has been seeing people that are not there and holding on conversations. He reports that they talk back to him when he talks to them. He reports that he has not slept more than 5 hours in the last week. He does have a history of alcohol abuse, but reports that he has not been drinking since his most recent hospitalization. No signs of withdrawal. Patient denies homicidality and suicidality.   Past Medical History  Diagnosis Date  . Anxiety   . Depression   . Hypertension   . Asthma   . Alcohol dependence ([facility])   . Pancreatitis    Past Surgical History  Procedure Laterality Date  . No past surgeries     Family History  Problem Relation Age of Onset  . OCD Mother   . Suicidality Neg Hx    Social History  Substance Use Topics  . Smoking status: Former Smoker    Types: Cigarettes    Quit date: 01/31/1999  . Smokeless tobacco: Never Used  . Alcohol Use: Yes     Comment: varies how much    Review of Systems  Psychiatric/Behavioral: Positive for suicidal ideas, hallucinations and sleep disturbance. Negative for self-injury. The patient is nervous/anxious.   All other systems reviewed and are negative.     Allergies  Review of patient's allergies indicates no known allergies.  Home Medications   Prior to Admission medications   Medication Sig  Start Date End Date Taking? Authorizing Provider  acamprosate (CAMPRAL) 333 MG tablet Take 666 mg by mouth 3 (three) times daily with meals.   Yes Historical Provider, MD  albuterol (PROVENTIL HFA;VENTOLIN HFA) 108 (90 BASE) MCG/ACT inhaler Inhale 2 puffs into the lungs every 4 (four) hours as needed for wheezing or shortness of breath. 12/25/14  Yes Sanjuana KavaAgnes I Nwoko, NP  FLUoxetine (PROZAC) 10 MG capsule Take 1 capsule (10 mg total) by mouth daily. For depression 12/25/14  Yes Sanjuana KavaAgnes I Nwoko, NP  hydrOXYzine (ATARAX/VISTARIL) 50 MG tablet Take 1 tablet (50 mg total) by mouth every 6 (six) hours as needed for anxiety. 12/25/14  Yes Sanjuana KavaAgnes I Nwoko, NP  lisinopril (PRINIVIL,ZESTRIL) 5 MG tablet Take 5 mg by mouth daily.   Yes Historical Provider, MD  naltrexone (DEPADE) 50 MG tablet Take 0.5 tablets (25 mg total) by mouth daily. For alcoholism 12/25/14  Yes Sanjuana KavaAgnes I Nwoko, NP  traZODone (DESYREL) 150 MG tablet Take 1 tablet (150 mg total) by mouth at bedtime. For sleep 12/25/14  Yes Sanjuana KavaAgnes I Nwoko, NP  lisinopril (PRINIVIL,ZESTRIL) 5 MG tablet Take 1 tablet (5 mg total) by mouth daily. For high blood pressure 12/25/14   Sanjuana KavaAgnes I Nwoko, NP  LORazepam (ATIVAN) 1 MG tablet Take 1 tablet (1 mg total) by mouth 2 (two) times daily. For alcohol detox. Due 12-25-14 in the evening 12/25/14   Sanjuana KavaAgnes I Nwoko, NP  LORazepam (ATIVAN) 1 MG tablet Take 1 tablet (1 mg total) by mouth daily. Due  12-26-14 in am: For alcohol detox 12/26/14   Sanjuana Kava, NP   BP 115/84 mmHg  Pulse 92  Temp(Src) 98.2 F (36.8 C) (Oral)  Resp 15  SpO2 92% Physical Exam  Constitutional: He is oriented to person, place, and time. He appears well-developed and well-nourished. No distress.  HENT:  Head: Normocephalic and atraumatic.  Right Ear: Hearing normal.  Left Ear: Hearing normal.  Nose: Nose normal.  Mouth/Throat: Oropharynx is clear and moist and mucous membranes are normal.  Eyes: Conjunctivae and EOM are normal. Pupils are equal,  round, and reactive to light.  Neck: Normal range of motion. Neck supple.  Cardiovascular: Regular rhythm, S1 normal and S2 normal.  Exam reveals no gallop and no friction rub.   No murmur heard. Pulmonary/Chest: Effort normal and breath sounds normal. No respiratory distress. He exhibits no tenderness.  Abdominal: Soft. Normal appearance and bowel sounds are normal. There is no hepatosplenomegaly. There is no tenderness. There is no rebound, no guarding, no tenderness at McBurney's point and negative Murphy's sign. No hernia.  Musculoskeletal: Normal range of motion.  Neurological: He is alert and oriented to person, place, and time. He has normal strength. No cranial nerve deficit or sensory deficit. Coordination normal. GCS eye subscore is 4. GCS verbal subscore is 5. GCS motor subscore is 6.  Skin: Skin is warm, dry and intact. No rash noted. No cyanosis.  Psychiatric: His speech is normal and behavior is normal. Thought content normal. His mood appears anxious.  Nursing note and vitals reviewed.   ED Course  Procedures (including critical care time) Labs Review Labs Reviewed - No data to display  Imaging Review No results found. I have personally reviewed and evaluated these images and lab results as part of my medical decision-making.   EKG Interpretation None      MDM   Final diagnoses:  None   anxiety Hallucinations Insomnia  Patient presents to the emergency department for evaluation of anxiety with visual and auditory hallucinations. Patient is not homicidal or suicidal. He is cooperative with exam. Medical screening exam performed. We'll ask psychiatry to evaluate him for further management.    Gilda Crease, MD 01/17/15 [phone]

## 2015-01-17 NOTE — ED Notes (Signed)
In hall talking w/ others

## 2015-01-17 NOTE — ED Notes (Signed)
Walking in hall looking for "jackson".  Pt sts that he just saw him down the hall.  Pt informed that there is no on by thay name here.

## 2015-01-17 NOTE — ED Notes (Signed)
Bed: [id number] Expected date:  Expected time:  Means of arrival:  Comments: RM 9 

## 2015-01-17 NOTE — ED Notes (Signed)
Pt. Responding to internal stimuli by examining door to bathroom at length and attempting to dislodge door from the hinges.

## 2015-01-17 NOTE — ED Notes (Signed)
Pt. Noted in room. No complaints or concerns voiced. No acute distress noted. Will continue to monitor with security cameras. Q 15 minute rounds continue. 

## 2015-01-17 NOTE — ED Notes (Signed)
Laying in room

## 2015-01-17 NOTE — ED Notes (Addendum)
Pt up in hall, reports that he left and went to the parking lot and needs to go back to his car.  Pt is aware that he can not leave at this time, unable to reorient.

## 2015-01-17 NOTE — BH Assessment (Signed)
Assessment Note  Ian [name] is an 35 y.o. male. Patient was brought into the ED by his step-father because of bizarre behaviors, severe anxiety, and hallucinations.  Patient currently denies SI/HI and self-injurious behaviors.  Patient reports severe anxiety, restlessness, command hallucination, no sleep since 12/14, and paranoia.  Patient reports history of alcoholism but no use in the past week and half.  Patient reports starting with outpatient services with [facility] in the past week but since new medications he has experienced an increase of symptoms.    This Clinical research associate consulted with Dr. Jannifer Franklin it is recommended to refer for inpatient hospitalization for safety.    Diagnosis: Bipolar Disorder, current episode depressed, severe, w/ psychotic features, Alcohol use, severe   Past Medical History:  Past Medical History  Diagnosis Date  . Anxiety   . Depression   . Hypertension   . Asthma   . Alcohol dependence (HCC)   . Pancreatitis     Past Surgical History  Procedure Laterality Date  . No past surgeries      Family History:  Family History  Problem Relation Age of Onset  . OCD Mother   . Suicidality Neg Hx     Social History:  reports that he quit smoking about 15 years ago. His smoking use included Cigarettes. He has never used smokeless tobacco. He reports that he drinks alcohol. He reports that he does not use illicit drugs.  Additional Social History:  Alcohol / Drug Use Pain Medications: see chart Prescriptions: see chart Over the Counter: see chart History of alcohol / drug use?: Yes Longest period of sobriety (when/how long): 1 week Negative Consequences of Use: Financial, Personal relationships, Work / Programmer, multimedia Withdrawal Symptoms: Sweats, Diarrhea, DTs, Tingling, Irritability, Tremors, Weakness, Nausea / Vomiting Substance #1 Name of Substance 1: alcohol 1 - Amount (size/oz): varies 1 - Frequency: daily 1 - Last Use / Amount: 1 half week  CIWA: CIWA-Ar BP:  123/90 mmHg Pulse Rate: 95 Nausea and Vomiting: no nausea and no vomiting Tactile Disturbances: very mild itching, pins and needles, burning or numbness Tremor: two Auditory Disturbances: very mild harshness or ability to frighten Paroxysmal Sweats: barely perceptible sweating, palms moist Visual Disturbances: very mild sensitivity Anxiety: mildly anxious Headache, Fullness in Head: none present Agitation: somewhat more than normal activity Orientation and Clouding of Sensorium: cannot do serial additions or is uncertain about date CIWA-Ar Total: 9 COWS:    Allergies: No Known Allergies  Home Medications:  (Not in a hospital admission)  OB/GYN Status:  No LMP for male patient.  General Assessment Data Location of Assessment: [facility] TTS Assessment: In system Is this a Tele or Face-to-Face Assessment?: Face-to-Face Is this an Initial Assessment or a Re-assessment for this encounter?: Initial Assessment Marital status: Single Maiden name: na Is patient pregnant?: No Pregnancy Status: No Living Arrangements: Alone Can pt return to current living arrangement?: Yes Admission Status: Voluntary Is patient capable of signing voluntary admission?: Yes Referral Source: Self/Family/Friend Insurance type: BCBS  Medical Screening Exam [facility] Walk-in ONLY) Medical Exam completed: Yes  Crisis Care Plan Living Arrangements: Alone Name of Psychiatrist: [facility] Name of Therapist: [facility]  Education Status Is patient currently in school?: No Current Grade: na Highest grade of school patient has completed: B.S. degree Name of school: na Contact person: na  Risk to self with the past 6 months Suicidal Ideation: No-Not Currently/Within Last 6 Months Has patient been a risk to self within the past 6 months prior to admission? :  No Suicidal Intent: No-Not Currently/Within Last 6 Months Has patient had any suicidal intent within the past 6 months prior to admission? : No Is  patient at risk for suicide?: No Suicidal Plan?: No-Not Currently/Within Last 6 Months Has patient had any suicidal plan within the past 6 months prior to admission? : No Access to Means: No What has been your use of drugs/alcohol within the last 12 months?: alcohol Previous Attempts/Gestures: Yes How many times?: 1 Other Self Harm Risks: na Triggers for Past Attempts: Unpredictable Intentional Self Injurious Behavior: None Family Suicide History: No Recent stressful life event(s): Loss (Comment), Conflict (Comment) Persecutory voices/beliefs?: No Depression: Yes Depression Symptoms: Insomnia Substance abuse history and/or treatment for substance abuse?: Yes  Risk to Others within the past 6 months Homicidal Ideation: No-Not Currently/Within Last 6 Months Does patient have any lifetime risk of violence toward others beyond the six months prior to admission? : No Thoughts of Harm to Others: No-Not Currently Present/Within Last 6 Months Current Homicidal Intent: No-Not Currently/Within Last 6 Months Current Homicidal Plan: No-Not Currently/Within Last 6 Months Access to Homicidal Means: No Identified Victim: na History of harm to others?: No Assessment of Violence: None Noted Violent Behavior Description: na Does patient have access to weapons?: No Criminal Charges Pending?: No Does patient have a court date: No Is patient on probation?: No  Psychosis Hallucinations: Auditory, Visual, With command Delusions: Unspecified  Mental Status Report Appearance/Hygiene: In hospital gown Eye Contact: Fair Motor Activity: Restlessness, Hyperactivity, Mannerisms Speech: Rapid Level of Consciousness: Restless Mood: Anxious Affect: Anxious Anxiety Level: Severe Thought Processes: Circumstantial Judgement: Impaired Orientation: Person Obsessive Compulsive Thoughts/Behaviors: None  Cognitive Functioning Concentration: Poor Memory: Recent Intact, Remote Intact IQ: Average Insight:  Poor Impulse Control: Poor Appetite: Fair Weight Loss: 0 Weight Gain: 0 Sleep: Decreased Total Hours of Sleep: 3 Vegetative Symptoms: None  ADLScreening [facility] Assessment Services) Patient's cognitive ability adequate to safely complete daily activities?: Yes Patient able to express need for assistance with ADLs?: Yes Independently performs ADLs?: Yes (appropriate for developmental age)  Prior Inpatient Therapy Prior Inpatient Therapy: Yes Prior Therapy Dates: 2016, 2013 Prior Therapy Facilty/Provider(s): [facility] Reason for Treatment: depression, anxiety, substance abuse  Prior Outpatient Therapy Prior Outpatient Therapy: Yes Prior Therapy Dates: current Prior Therapy Facilty/Provider(s): [facility]  Reason for Treatment: anxiety, depression, substance abuse Does patient have an ACCT team?: No Does patient have Intensive In-House Services?  : No Does patient have [facility] services? : No Does patient have P4CC services?: No  ADL Screening (condition at time of admission) Patient's cognitive ability adequate to safely complete daily activities?: Yes Patient able to express need for assistance with ADLs?: Yes Independently performs ADLs?: Yes (appropriate for developmental age)       Abuse/Neglect Assessment (Assessment to be complete while patient is alone) Physical Abuse: Denies Verbal Abuse: Denies Sexual Abuse: Denies Exploitation of patient/patient's resources: Denies Self-Neglect: Denies Values / Beliefs Cultural Requests During Hospitalization: None Spiritual Requests During Hospitalization: None Consults Spiritual Care Consult Needed: No Social Work Consult Needed: No Merchant navy officer (For Healthcare) Would patient like information on creating an advanced directive?: No - patient declined information    Additional Information 1:1 In Past 12 Months?: No CIRT Risk: No Elopement Risk: No Does patient have medical clearance?: Yes     Disposition:   Disposition Initial Assessment Completed for this Encounter: Yes Disposition of Patient: Inpatient treatment program Type of inpatient treatment program: Adult  On Site Evaluation by:   Reviewed with Physician:    Laurence Aly,  Olivia Mackieressa A 01/17/2015 12:07 PM

## 2015-01-17 NOTE — ED Notes (Signed)
layig on the bed w/ his eyes closed

## 2015-01-17 NOTE — ED Notes (Signed)
Pt. standing in chair trying to climb up on shelf.

## 2015-01-17 NOTE — ED Notes (Signed)
Pt reports that his parents are banging on the door so he can leave and go to the other hospital.  Pt is aware that he is going to be admitted, but there is not a bed available yet.  Pt insistant that there is a bed ready for him and that his parents are going to take him.  Pt is also aware that he is going to be placed under IVC.

## 2015-01-17 NOTE — ED Notes (Addendum)
Pt still confused, slightly less aggitated, smiling at desk, remains redirectable, but still wanting to leave and go to "the other hosptial."  Pt is aware that he is going to be admitted and will when a bed is available,.

## 2015-01-17 NOTE — ED Notes (Signed)
Up in hall/room restless, picking at things on the walls/doodr, pleasant, redirectable.

## 2015-01-17 NOTE — ED Notes (Signed)
Pt. Noted resting in room. No complaints or concerns voiced. No acute distress noted. Will continue to monitor with security cameras. Q 15 minute rounds continue.

## 2015-01-17 NOTE — ED Notes (Signed)
Pt. Awake walking down hall dumping water out of pitcher onto floor. Entered another patients room and attempted to pour water on him. Pt. escourted to his room.

## 2015-01-17 NOTE — ED Notes (Signed)
In his room, more appriate in responses, but still hallucinating

## 2015-01-17 NOTE — ED Notes (Signed)
Up to the desk asking about dinner, pt encouraged to return to room to rest and did so.  Calmer, appears more focused, but still confused,  Reorients more easily

## 2015-01-17 NOTE — ED Notes (Signed)
Lab here to draw.

## 2015-01-17 NOTE — ED Notes (Signed)
On the phone, wanting to leave

## 2015-01-17 NOTE — ED Notes (Signed)
In room watching tv and talking to commintator

## 2015-01-17 NOTE — Progress Notes (Signed)
Disposition CSW completed patient referrals to the following inpatient psych facilities:  [facility]  CSW will continue to follow patient for placement needs.  Seward SpeckLeo Jamaar Howes [facility]W,LCAS Behavioral Health Disposition CSW [phone]

## 2015-01-17 NOTE — ED Notes (Signed)
Pt. Came in to ED POV with complaint of auditory and visual hallucinations which started weeks ago, hallucination gets worst this morning. denies SI. Pt. Stated that he's been multiple medicines for his anxiety, depressions and alcohol problems. Denies pain .

## 2015-01-17 NOTE — ED Notes (Signed)
Pt. Noted sleeping in room. No complaints or concerns voiced. No distress or abnormal behavior noted. Will continue to monitor with security cameras. Q 15 minute rounds continue. 

## 2015-01-17 NOTE — ED Notes (Signed)
Sitting in floor in front of door.  Pt reports that 'he" went into his room.  Pt informed that there is no one in his room

## 2015-01-17 NOTE — ED Notes (Signed)
Pt's mother called and reports that [facility] , went to the orientation session on Friday and is scheduled to go tomorrow.  She reports that they suggested that he double his trazadone last night to help him sleep, which he did ,but  He still did not sleep.  She also reports that he stopped taking the visteril yesterday.  She is also concerned that he is on to many medications and may have difficulty taking them as directed.

## 2015-01-17 NOTE — ED Notes (Signed)
Dr a and jamison dnp in w/ pt

## 2015-01-18 ENCOUNTER — Encounter (HOSPITAL_COMMUNITY): Payer: Self-pay | Admitting: *Deleted

## 2015-01-18 ENCOUNTER — Inpatient Hospital Stay (HOSPITAL_COMMUNITY)
Admission: AD | Admit: 2015-01-18 | Discharge: 2015-01-21 | DRG: 885 | Disposition: A | Discharge status: Home / Self Care | Payer: BLUE CROSS/BLUE SHIELD | Source: Intra-hospital | Attending: Psychiatry | Admitting: Psychiatry

## 2015-01-18 DIAGNOSIS — F331 Major depressive disorder, recurrent, moderate: Principal | ICD-10-CM | POA: Diagnosis present

## 2015-01-18 DIAGNOSIS — F315 Bipolar disorder, current episode depressed, severe, with psychotic features: Secondary | ICD-10-CM | POA: Diagnosis not present

## 2015-01-18 DIAGNOSIS — Z87891 Personal history of nicotine dependence: Secondary | ICD-10-CM | POA: Diagnosis not present

## 2015-01-18 DIAGNOSIS — F1020 Alcohol dependence, uncomplicated: Secondary | ICD-10-CM | POA: Diagnosis not present

## 2015-01-18 DIAGNOSIS — Z8719 Personal history of other diseases of the digestive system: Secondary | ICD-10-CM

## 2015-01-18 DIAGNOSIS — F3131 Bipolar disorder, current episode depressed, mild: Secondary | ICD-10-CM | POA: Diagnosis present

## 2015-01-18 DIAGNOSIS — I4581 Long QT syndrome: Secondary | ICD-10-CM | POA: Diagnosis present

## 2015-01-18 DIAGNOSIS — F411 Generalized anxiety disorder: Secondary | ICD-10-CM | POA: Diagnosis not present

## 2015-01-18 DIAGNOSIS — R9431 Abnormal electrocardiogram [ECG] [EKG]: Secondary | ICD-10-CM | POA: Clinically undetermined

## 2015-01-18 MED ORDER — ACAMPROSATE CALCIUM 333 MG PO TBEC
666.0000 mg | DELAYED_RELEASE_TABLET | Freq: Three times a day (TID) | ORAL | Status: DC
  Administered 2015-01-19 – 2015-01-21 (×8): 666 mg via ORAL
  Filled 2015-01-18 (×14): qty 2

## 2015-01-18 MED ORDER — HYDROXYZINE HCL 50 MG PO TABS
50.0000 mg | ORAL_TABLET | Freq: Four times a day (QID) | ORAL | Status: DC | PRN
  Administered 2015-01-19: 50 mg via ORAL
  Filled 2015-01-18 (×2): qty 1

## 2015-01-18 MED ORDER — LISINOPRIL 5 MG PO TABS
5.0000 mg | ORAL_TABLET | Freq: Every day | ORAL | Status: DC
  Administered 2015-01-19 – 2015-01-21 (×3): 5 mg via ORAL
  Filled 2015-01-18 (×5): qty 1

## 2015-01-18 MED ORDER — NALTREXONE HCL 50 MG PO TABS
25.0000 mg | ORAL_TABLET | Freq: Every day | ORAL | Status: DC
  Administered 2015-01-19 – 2015-01-21 (×3): 25 mg via ORAL
  Filled 2015-01-18 (×5): qty 1

## 2015-01-18 MED ORDER — LORAZEPAM 2 MG/ML IJ SOLN
INTRAMUSCULAR | Status: AC
  Filled 2015-01-18: qty 1

## 2015-01-18 MED ORDER — DOXEPIN HCL 25 MG PO CAPS
25.0000 mg | ORAL_CAPSULE | Freq: Every evening | ORAL | Status: DC | PRN
  Administered 2015-01-18: 25 mg via ORAL
  Filled 2015-01-18 (×5): qty 1

## 2015-01-18 MED ORDER — ASENAPINE MALEATE 5 MG SL SUBL
10.0000 mg | SUBLINGUAL_TABLET | Freq: Two times a day (BID) | SUBLINGUAL | Status: DC
  Administered 2015-01-18 – 2015-01-19 (×2): 10 mg via SUBLINGUAL
  Filled 2015-01-18 (×5): qty 2

## 2015-01-18 MED ORDER — ACETAMINOPHEN 325 MG PO TABS
650.0000 mg | ORAL_TABLET | Freq: Four times a day (QID) | ORAL | Status: DC | PRN
  Administered 2015-01-19 – 2015-01-21 (×2): 650 mg via ORAL
  Filled 2015-01-18 (×2): qty 2

## 2015-01-18 MED ORDER — ALUM & MAG HYDROXIDE-SIMETH 200-200-20 MG/5ML PO SUSP: 30.0000 mL | ORAL | Status: DC | PRN

## 2015-01-18 MED ORDER — ALBUTEROL SULFATE HFA 108 (90 BASE) MCG/ACT IN AERS
2.0000 | INHALATION_SPRAY | RESPIRATORY_TRACT | Status: DC | PRN
  Administered 2015-01-18 – 2015-01-21 (×9): 2 via RESPIRATORY_TRACT
  Filled 2015-01-18: qty 6.7

## 2015-01-18 MED ORDER — VITAMIN B-1 100 MG PO TABS
100.0000 mg | ORAL_TABLET | Freq: Every day | ORAL | Status: DC
  Administered 2015-01-19 – 2015-01-21 (×3): 100 mg via ORAL
  Filled 2015-01-18 (×5): qty 1

## 2015-01-18 MED ORDER — FLUOXETINE HCL 10 MG PO CAPS
10.0000 mg | ORAL_CAPSULE | Freq: Every day | ORAL | Status: DC
  Administered 2015-01-19: 10 mg via ORAL
  Filled 2015-01-18 (×2): qty 1

## 2015-01-18 MED ORDER — MAGNESIUM HYDROXIDE 400 MG/5ML PO SUSP: 30.0000 mL | Freq: Every day | ORAL | Status: DC | PRN

## 2015-01-18 MED ORDER — GABAPENTIN 300 MG PO CAPS
600.0000 mg | ORAL_CAPSULE | Freq: Every day | ORAL | Status: DC
Start: 2015-01-18 — End: 2015-01-21
  Administered 2015-01-18 – 2015-01-20 (×3): 600 mg via ORAL
  Filled 2015-01-18 (×6): qty 2

## 2015-01-18 MED ORDER — THIAMINE HCL 100 MG/ML IJ SOLN: 100.0000 mg | Freq: Every day | INTRAMUSCULAR | Status: DC

## 2015-01-18 NOTE — Tx Team (Signed)
Initial Interdisciplinary Treatment Plan   PATIENT STRESSORS: Not sleeping d/t recently not using alcohol to sleep   PATIENT STRENGTHS: Ability for insight General fund of knowledge   PROBLEM LIST: Problem List/Patient Goals Date to be addressed Date deferred Reason deferred Estimated date of resolution  History of Substance abuse 01/18/15     Anxiety 01/18/15     "I want to sleep" 01/18/15     "Stay off alcohol" 01/18/15                                    DISCHARGE CRITERIA:  Improved stabilization in mood, thinking, and/or behavior Need for constant or close observation no longer present  PRELIMINARY DISCHARGE PLAN: Outpatient therapy Return to previous living arrangement  PATIENT/FAMIILY INVOLVEMENT: This treatment plan has been presented to and reviewed with the patient, Leory PlowmanJonathan N Meding.  The patient and family have been given the opportunity to ask questions and make suggestions.  Norm ParcelHeather V Matie Dimaano 01/18/2015, 6:54 PM

## 2015-01-18 NOTE — BH Assessment (Signed)
[facility] Assessment Progress Note  Pt was asleep in his room when writer entered the room. 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 Pt then asked, "Am I in the hospital"?  He stated that he was exhausted, and that he had not had breakfast yet, but nurse reports he has had breakfast already.  Pt then went back to sleep.  TTS continuing to seek IP placement.

## 2015-01-18 NOTE — ED Notes (Signed)
Pt. Noted sleeping in room. No complaints or concerns voiced. No distress or abnormal behavior noted. Will continue to monitor with security cameras. Q 15 minute rounds continue. 

## 2015-01-18 NOTE — BH Assessment (Signed)
[facility] Assessment Progress Note  Per Thedore MinsMojeed Akintayo, MD, this pt requires psychiatric hospitalization at this time.  [name], RN, [id number].  Pt has signed Voluntary Admission and Consent for Treatment, as well as Consent to Release Information to [facility], and a notification call has been placed.  Signed forms [facility] to [facility].  Pt's nurse, Rayfield CitizenCaroline, has been notified, and agrees to send original paperwork along with pt via [facility], and to call report to [phone].  Ian Canninghomas Damonique Brunelle, MA Triage Specialist [phone]

## 2015-01-18 NOTE — ED Notes (Signed)
Patient received all personal belongings.  Reviewed admission process with patient and he indicated understanding.  Report given to Jan W., RN.  Patient left ambulatory via [facility] transportation.

## 2015-01-18 NOTE — ED Notes (Signed)
[facility].  Patient was concerned about his treatment.  He states, "I want to know why this happened.  I've never had hallucinations before.  Is this the damage from all the alcohol abuse?"

## 2015-01-18 NOTE — Progress Notes (Signed)
Adult Psychoeducational Group Note  Date:  01/18/2015 Time:  8:20 PM  Group Topic/Focus:  Wrap-Up Group:   The focus of this group is to help patients review their daily goal of treatment and discuss progress on daily workbooks.  Participation Level:  Minimal  Participation Quality:  Appropriate and Attentive  Affect:  Appropriate  Cognitive:  Appropriate  Insight: Appropriate  Engagement in Group:  Engaged  Modes of Intervention:  Discussion  Additional Comments:  Pt rated his day an 8 out of 10 and stated that it was "alright". Pt reported that he did not have a goal for the day. Pt was short with his answers, but was very pleasant.   Cleotilde NeerJasmine S Jamelia Varano 01/18/2015, 10:12 PM

## 2015-01-18 NOTE — Progress Notes (Signed)
Pt came to the hospital and reports that he has had insomnia since he stopped drinking. He has not slept in days and started having auditory and visual hallucinations. Pt says that he has not had alcohol since Christmas. He has a 34 yr old son that he shares custody. Pt has a medical hx of hypertension, asthma and pancreatitis. Pt has a mild tremor.

## 2015-01-18 NOTE — ED Notes (Signed)
Pt. Urinating in his room. Pt. Given towels to clean up urine and directed to restroom.

## 2015-01-18 NOTE — Progress Notes (Signed)
D: Patient in the hallway on approach.  Patient states he is new to the unit and states he had to come in because he was not sleeping.  Patient states he began having hallucinations.  Patient states that is new for him.  Patient states he went several days without sleeping and then he had to urge to drink but did not.  Patient currently denies SI/HI and denies AVH. A: Staff to monitor Q 15 mins for safety.  Encouragement and support offered.  Scheduled medications administered per orders.  Albuterol inhaler administered prn for SOB. R: Patient remains safe on the unit.  Patient attended group tonight.  Patient visible on the unit and interacting with peers.  Patient taking administered medications.

## 2015-01-19 ENCOUNTER — Encounter (HOSPITAL_COMMUNITY): Payer: Self-pay | Admitting: Psychiatry

## 2015-01-19 DIAGNOSIS — Z8719 Personal history of other diseases of the digestive system: Secondary | ICD-10-CM | POA: Insufficient documentation

## 2015-01-19 DIAGNOSIS — F331 Major depressive disorder, recurrent, moderate: Principal | ICD-10-CM

## 2015-01-19 DIAGNOSIS — R9431 Abnormal electrocardiogram [ECG] [EKG]: Secondary | ICD-10-CM | POA: Clinically undetermined

## 2015-01-19 DIAGNOSIS — F1020 Alcohol dependence, uncomplicated: Secondary | ICD-10-CM

## 2015-01-19 DIAGNOSIS — F411 Generalized anxiety disorder: Secondary | ICD-10-CM

## 2015-01-19 MED ORDER — ZOLPIDEM TARTRATE 5 MG PO TABS
5.0000 mg | ORAL_TABLET | Freq: Every evening | ORAL | Status: DC | PRN
  Administered 2015-01-19: 5 mg via ORAL
  Filled 2015-01-19: qty 1

## 2015-01-19 MED ORDER — DOXEPIN HCL 25 MG PO CAPS
25.0000 mg | ORAL_CAPSULE | Freq: Every day | ORAL | Status: DC
  Filled 2015-01-19 (×2): qty 1

## 2015-01-19 MED ORDER — HYDROXYZINE HCL 25 MG PO TABS: 25.0000 mg | ORAL_TABLET | Freq: Three times a day (TID) | ORAL | Status: DC | PRN

## 2015-01-19 MED ORDER — FLUOXETINE HCL 20 MG PO CAPS
20.0000 mg | ORAL_CAPSULE | Freq: Every day | ORAL | Status: DC
  Filled 2015-01-19 (×2): qty 1

## 2015-01-19 MED ORDER — ASENAPINE MALEATE 5 MG SL SUBL
5.0000 mg | SUBLINGUAL_TABLET | Freq: Every day | SUBLINGUAL | Status: DC
  Filled 2015-01-19 (×2): qty 1

## 2015-01-19 MED ORDER — HYDROXYZINE HCL 50 MG PO TABS
50.0000 mg | ORAL_TABLET | Freq: Three times a day (TID) | ORAL | Status: DC | PRN
  Administered 2015-01-19 (×2): 50 mg via ORAL
  Filled 2015-01-19 (×2): qty 1

## 2015-01-19 NOTE — [facility] Suicide Risk Assessment (Signed)
[facility]   Nursing information obtained from:  Patient Demographic factors:  Male, Caucasian, Living alone Current Mental Status:  NA Loss Factors:  Financial problems / change in socioeconomic status Historical Factors:  NA Risk Reduction Factors:  Responsible for children under 34 years of age, Sense of responsibility to family Total Time spent with patient: 30 minutes Principal Problem: Major depressive disorder, recurrent episode, moderate (HCC) Diagnosis:   Patient Active Problem List   Diagnosis Date Noted  . Alcohol use disorder, severe, dependence (HCC) [F10.20] 08/23/2014  . Suicidal ideation [R45.851] 08/23/2014  . Pancreatitis [K85.9] 01/30/2014  . Acute pancreatitis [K85.90] 01/30/2014  . Abdominal pain [R10.9] 01/30/2014  . Nausea and vomiting [R11.2] 01/30/2014  . Hyponatremia [E87.1] 01/30/2014  . Leukocytosis [D72.829]   . Nicotine abuse [F19.10] 08/14/2013  . GAD (generalized anxiety disorder) [F41.1] 08/14/2013  . Major depressive disorder, recurrent episode, moderate (HCC) [F33.1] 08/14/2013  . ABDOMINAL PAIN OTHER SPECIFIED SITE [R10.9] 08/05/2009  . CHEST PAIN [R07.9] 01/19/2009  . CERUMEN IMPACTION [H61.20] 07/31/2008  . ASTHMA [J45.909] 07/31/2008     Continued Clinical Symptoms:    The "Alcohol Use Disorders Identification Test", Guidelines for Use in Primary Care, Second Edition.  World Science writerHealth Organization [facility](WHO). Score between 0-7:  no or low risk or alcohol related problems. Score between 8-15:  moderate risk of alcohol related problems. Score between 16-19:  high risk of alcohol related problems. Score 20 or above:  warrants further diagnostic evaluation for alcohol dependence and treatment.   CLINICAL FACTORS:   Alcohol/Substance Abuse/Dependencies Previous Psychiatric Diagnoses and Treatments   Musculoskeletal: Strength & Muscle Tone: within normal limits Gait & Station: normal Patient leans: N/A  Psychiatric  Specialty Exam: Physical Exam  Review of Systems  Psychiatric/Behavioral: Positive for depression and substance abuse. The patient is nervous/anxious.   All other systems reviewed and are negative.   Blood pressure 111/86, pulse 100, temperature 97.7 F (36.5 C), temperature source Oral, resp. rate 16, height 6' (1.829 m), weight 94.348 kg (208 lb), SpO2 98 %.Body mass index is 28.2 kg/(m^2).                    Please see H&P.                                      COGNITIVE FEATURES THAT CONTRIBUTE TO RISK:  Closed-mindedness, Polarized thinking and Thought constriction (tunnel vision)    SUICIDE RISK:   Mild:  Suicidal ideation of limited frequency, intensity, duration, and specificity.  There are no identifiable plans, no associated intent, mild dysphoria and related symptoms, good self-control (both objective and subjective assessment), few other risk factors, and identifiable protective factors, including available and accessible social support.  PLAN OF CARE: Please see H&P.   Medical Decision Making:  Review of Psycho-Social Stressors (1), Review or order clinical lab tests (1), Discuss test with performing physician (1), Established Problem, Worsening (2), Review of Last Therapy Session (1), Review or order medicine tests (1) and Review of New Medication or Change in Dosage (2)  I certify that inpatient services furnished can reasonably be expected to improve the patient's condition.   Yafet Cline md 01/19/2015, 11:28 AM

## 2015-01-19 NOTE — Tx Team (Signed)
Interdisciplinary Treatment Plan Update (Adult)  Date:  01/19/2015   Time Reviewed:  2:21 PM   Progress in Treatment: Attending groups: Yes. Participating in groups:  Yes. Taking medication as prescribed:  Yes. Tolerating medication:  Yes. Family/Significant other contact made:  No Patient understands diagnosis:  Yes  As evidenced by seeking help with psychosis, depression and anxiety Discussing patient identified problems/goals with staff:  Yes, see initial care plan. Medical problems stabilized or resolved:  Yes. Denies suicidal/homicidal ideation: Yes. Issues/concerns per patient self-inventory:  No. Other:  New problem(s) identified:  Discharge Plan or Barriers: see below  Reason for Continuation of Hospitalization: Anxiety Depression Medication stabilization  Comments:  Patient with chronic alcohol use do as well as depression and anxiety, presented with hallucinations after he briefly relapsed on alcohol over the holidays. Pt at this time appears calm , cooperative and is not observed as responding to internal stimuli.  Patient will benefit from inpatient treatment and stabilization.  Estimated length of stay is 2-3 days. Will continue Prozac, increase to 20 mg for affective sx. Will d/c doxapine and discuss another sleep med with pt as QTC is increased. Will continue Gabapentin 600 mg po qhs for anxiety sx. Will continue Naltrexone 25 mg po daily .  Estimated length of stay: Likely d/c Thursday as he signed 72 hr request on 1/9  New goal(s):  Review of initial/current patient goals per problem list:   Review of initial/current patient goals per problem list:  1. Goal(s): Patient will participate in aftercare plan   Met: Yes   Target date: 3-5 days post admission date   As evidenced by: Patient will participate within aftercare plan AEB aftercare provider and housing plan at discharge being identified.  01/19/2015: Plans to return home, follow up with [facility]  CDIOP program  [Beth alerted]   2. Goal (s): Patient will exhibit decreased depressive symptoms and suicidal ideations.   Met: No   Target date: 3-5 days post admission date   As evidenced by: Patient will utilize self rating of depression at 3 or below and demonstrate decreased signs of depression or be deemed stable for discharge by MD. 01/19/15:  Rates depression a 5 today     3. Goal(s): Patient will demonstrate decreased signs and symptoms of anxiety.   Met: No   Target date: 3-5 days post admission date   As evidenced by: Patient will utilize self rating of anxiety at 3 or below and demonstrated decreased signs of anxiety, or be deemed stable for discharge by MD 01/19/15:  Rates his anxiety a 7; is requiring PRN vistaril for stabilization     4. Goal(s): Patient will demonstrate decreased signs of withdrawal due to substance abuse   Met: Yes   Target date: 3-5 days post admission date   As evidenced by: Patient will produce a CIWA/COWS score of 0, have stable vitals signs, and no symptoms of withdrawal 01/19/15: CIWA score of 1; vitals are stable; is not requiring librium taper.          6. Goal (s): Patient will demonstrate decreased signs of mania  * Met: Yes  * Target date: 3-5 days post admission date  * As evidenced by: Patient demonstrate decreased signs of mania AEB decreased mood instability and return to baseline functioning 01/18/14:  Psychosis was related to alcohol withdrawal.  No signs nor symptoms today.      Attendees: Patient:  01/19/2015 2:21 PM   Family:   01/19/2015 2:21 PM   Physician:  Ursula Alert, MD 01/19/2015 2:21 PM   Nursing:   Manuella Ghazi, RN 01/19/2015 2:21 PM   CSW:    Roque Lias, LCSW   01/19/2015 2:21 PM   Other:  01/19/2015 2:21 PM   Other:   01/19/2015 2:21 PM   Other:  Lars Pinks, Nurse CM 01/19/2015 2:21 PM   Other:   01/19/2015 2:21 PM   Other:  Norberto Sorenson, Rose  01/19/2015 2:21 PM   Other:   01/19/2015 2:21 PM   Other:  01/19/2015 2:21 PM   Other:  01/19/2015 2:21 PM   Other:  01/19/2015 2:21 PM   Other:  01/19/2015 2:21 PM   Other:   01/19/2015 2:21 PM    Scribe for Treatment Team:   Trish Mage, 01/19/2015 2:21 PM

## 2015-01-19 NOTE — Progress Notes (Signed)
D:  Patient's self inventory sheet, patient sleeps good, sleep medication is helpful.  Good appetite, low energy level, good concentration.  Rated depression and hopeless 5, anxiety 7.  Denied withdrawals.  Denied SI.  Denied physical problems.  Denied pain.  Goal is to make sure there are no long term effects?  To learn more about my head/brain problems?  Medications and what they do.  Plans to speak with MD and SW.  Would like to work on sobriety , reason for drinking, can't sleep.  No discharge plans. A:  Medications administered per MD orders.  Emotional support and encouragement given patient. R:  Denied SI and HI, contracts for safety.  Denied A/V hallucinations.  Safety maintained with 15 minute checks.

## 2015-01-19 NOTE — Plan of Care (Signed)
Problem: Consults Goal: Psychosis Patient Education See Patient Education Module for education specifics.  Outcome: Progressing Nurse discussed depression/coping skills with patient.        

## 2015-01-19 NOTE — Progress Notes (Signed)
Ian [name] is a 35 y.o. male patient. CIOP Orientation: Met with pt to complete orientation for CD-IOP program. He will begin the program Monday 01/17/14.        [name] S, Licensed Cli

## 2015-01-19 NOTE — [facility] Suicide Risk Assessment (Signed)
[facility] INPATIENT:  Family/Significant Other Suicide Prevention Education  Suicide Prevention Education:  Education Completed; No one has been identified by the patient as the family member/significant other with whom the patient will be residing, and identified as the person(s) who will aid the patient in the event of a mental health crisis (suicidal ideations/suicide attempt).  With written consent from the patient, the family member/significant other has been provided the following suicide prevention education, prior to the and/or following the discharge of the patient.  The suicide prevention education provided includes the following:  Suicide risk factors  Suicide prevention and interventions  National Suicide Hotline telephone number  [facility] assessment telephone number  [facility] Emergency Assistance 911  [facility] and/or Residential Mobile Crisis Unit telephone number  Request made of family/significant other to:  Remove weapons (e.g., guns, rifles, knives), all items previously/currently identified as safety concern.    Remove drugs/medications (over-the-counter, prescriptions, illicit drugs), all items previously/currently identified as a safety concern.  The family member/significant other verbalizes understanding of the suicide prevention education information provided.  The family member/significant other agrees to remove the items of safety concern listed above. The patient did not endorse SI at the time of admission, nor did the patient c/o SI during the stay here.  SPE not required.   Ian [name], Ian [name] B 01/19/2015, 5:20 PM

## 2015-01-19 NOTE — Progress Notes (Signed)
Adult Psychoeducational Group Note  Date:  01/19/2015 Time:  10:06 PM  Group Topic/Focus:  Wrap-Up Group:   The focus of this group is to help patients review their daily goal of treatment and discuss progress on daily workbooks.  Participation Level:  Active  Participation Quality:  Appropriate  Affect:  Appropriate  Cognitive:  Alert  Insight: Appropriate  Engagement in Group:  Engaged  Modes of Intervention:  Discussion  Additional Comments:  Patient's goal for today was to figure out all of his medication. Patient rated his day as a 7 because "I should have went home yesterday, but my EKG held me back."  Valree Feild L Xzavior Reinig 01/19/2015, 10:06 PM

## 2015-01-19 NOTE — H&P (Signed)
Psychiatric Admission Assessment Adult  Patient Identification: Ian [name] MRN:  [id number] Date of Evaluation:  01/19/2015 Chief Complaint:"  I was going through a lot. I relapsed briefly , but I drank a lot during that time.'    Principal Diagnosis: Major depressive disorder, recurrent episode, moderate (HCC) Diagnosis:   Patient Active Problem List   Diagnosis Date Noted  . Alcohol use disorder, severe, dependence (HCC) [F10.20] 08/23/2014  . Suicidal ideation [R45.851] 08/23/2014  . Pancreatitis [K85.9] 01/30/2014  . Acute pancreatitis [K85.90] 01/30/2014  . Abdominal pain [R10.9] 01/30/2014  . Nausea and vomiting [R11.2] 01/30/2014  . Hyponatremia [E87.1] 01/30/2014  . Leukocytosis [D72.829]   . Nicotine abuse [F19.10] 08/14/2013  . GAD (generalized anxiety disorder) [F41.1] 08/14/2013  . Major depressive disorder, recurrent episode, moderate (HCC) [F33.1] 08/14/2013  . ABDOMINAL PAIN OTHER SPECIFIED SITE [R10.9] 08/05/2009  . CHEST PAIN [R07.9] 01/19/2009  . CERUMEN IMPACTION [H61.20] 07/31/2008  . ASTHMA [J45.909] 07/31/2008   History of Present Illness:: Ian [name] is a 35 y.o. Caucasian male, single , unemployed , lives by self in [place] , who has a hx of anxiety, depression and alcohol abuse , presented to [facility] after he relapsed on alcohol and started having hallucinations.  Per initial notes in EHR " Patient presented with suicidal ideations and excessive alcohol consumption. He stated he is currently having suicidal thoughts. He reports that his alcohol use has strained his relationship with others and that his parents see him as a disappointment.  Patient stated that he has not attempted suicide in the past but has received inpatient treatment for anxiety, depression, and alcoholism in 2016 and 2013 at [facility]. Patient reports depressive symptoms that include: insomnia, guilt, feelings of angry/irritability, and fatigue. He shares that he consumes alcohol daily up  to 1.5 gallons of vodka, with first age of use at or around 7. Patient reports that he currently sees [facility] for outpatient services, and reports that his psychiatrist is Ian [name], and that his therapist is Ian [name]. Patient lives by self. Patient denies history or present AVH, but acknowledges AVH when drinking excessive amounts of alcohol followed by paranoia of people coming to shoot him."  Patient seen today and chart reviewed.Discussed patient with treatment team. Patient seen as calm , cooperative . Pt reports that he relapsed on alcohol during the holidays and he drank about 1 gallon in two days. Pt reports that after that he stopped drinking again and after a few days of not drinking he started getting confused , talking out of his head and started seeing things. Pt also had sleep issues during that time. Pt reports that the last time he had hallucinations was on 01/17/15. Pt reports he did borrow 1 tablet of xanax from a neighbor during this time inorder to sleep. But that did not help. He also used Vistaril tablets , but that made it worse. Pt reports that Vistaril caps usually helps him and he wonders if the tablet form is different.  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 But today is a better day. He also has depressive sx like sadness, guilt , concentration issues , sleep issues. But he is OK with Prozac and he feels he is improving. He slept well last night. He was started on Doxepin last night ( as per admission orders). He is doing well on the same.  Pt denies any hx of trauma.  Pt does report some OCD type sx- but nothing significant - it does not affect his level of functioning.   Pt denies abusing any other drugs , other than alcohol and one time episode of using xanax.     Associated Signs/Symptoms: Depression Symptoms:  depressed mood, anhedonia, insomnia, fatigue, feelings of  worthlessness/guilt, difficulty concentrating, anxiety, loss of energy/fatigue, disturbed sleep, weight loss, (Hypo) Manic Symptoms:  Labiality of Mood, Anxiety Symptoms:  Excessive Worry, Panic Symptoms, Psychotic Symptoms:  Hallucinations: Auditory Paranoia, associated to his alcohol use- denies at this time  PTSD Symptoms: Negative Total Time spent with patient: 45 minutes  Past Psychiatric History: Patient has been admitted to [facility] atleast 3 times the patient reports he follows [facility] with [facility]. Pt denies suicide attempts. Risk to Self: Is patient at risk for suicide?: No Risk to Others:  No Prior Inpatient Therapy:  [facility] Ian [name], [facility] Prior Outpatient Therapy:  [facility]  Alcohol Screening: Patient refused Alcohol Screening Tool: Yes (per pt has not drank since Christmas) Brief Intervention: Yes Substance Abuse History in the last 12 months:  Yes.   Consequences of Substance Abuse: 2 DWI, Withdrawal: tremors sweats nausea Blackouts  Previous Psychotropic Medications: Yes Vistaril Trazodone Xanax 0.25 mg,Prozac Psychological Evaluations: No  Past Medical History:  Past Medical History  Diagnosis Date  . Anxiety   . Depression   . Hypertension   . Asthma   . Alcohol dependence (HCC)   . Pancreatitis     Past Surgical History  Procedure Laterality Date  . No past surgeries     Family History:  Family History  Problem Relation Age of Onset  . OCD Mother   . Suicidality Neg Hx    Family Psychiatric  History: Mother-has OCD Social History: Patient lives by self , is single , has joint custody of his 30 yr old son,is currently unemployed. Denies legal issues. Is religious. History  Alcohol Use  . Yes    Comment: varies how much     History  Drug Use No    Social History   Social History  . Marital Status: Single    Spouse Name: N/A  . Number of Children: N/A  . Years of Education: N/A   Social  History Main Topics  . Smoking status: Former Smoker    Types: Cigarettes    Quit date: 01/31/1999  . Smokeless tobacco: Never Used  . Alcohol Use: Yes     Comment: varies how much  . Drug Use: No  . Sexual Activity: Yes    Birth Control/ Protection: Condom   Other Topics Concern  . None   Social History Narrative  Additional Social History:                         Allergies:  No Known Allergies Lab Results:  No results found for this or any previous visit (from the past 48 hour(s)).  Metabolic Disorder Labs:  Lab Results  Component Value Date   HGBA1C 5.9* 12/24/2014   MPG 123 12/24/2014   MPG 111 08/14/2013   No results found for: PROLACTIN Lab Results  Component Value Date   CHOL 153 08/14/2013   TRIG 235* 08/14/2013   HDL 36* 08/14/2013   CHOLHDL 4.3 08/14/2013   VLDL 47* 08/14/2013   LDLCALC 70 08/14/2013   LDLCALC 112* 03/05/2009    Current Medications: Current Facility-Administered Medications  Medication Dose Route Frequency Provider Last Rate Last Dose  .  acamprosate (CAMPRAL) tablet 666 mg  666 mg Oral TID WC Charm RingsJamison Y Lord, NP   666 mg at 01/19/15 [id number]  . acetaminophen (TYLENOL) tablet 650 mg  650 mg Oral Q6H PRN Charm RingsJamison Y Lord, NP   650 mg at 01/19/15 0931  . albuterol (PROVENTIL HFA;VENTOLIN HFA) 108 (90 Base) MCG/ACT inhaler 2 puff  2 puff Inhalation Q4H PRN Charm RingsJamison Y Lord, NP   2 puff at 01/19/15 [phone]  . alum & mag hydroxide-simeth (MAALOX/MYLANTA) 200-200-20 MG/5ML suspension 30 mL  30 mL Oral Q4H PRN Charm RingsJamison Y Lord, NP      . asenapine (SAPHRIS) sublingual tablet 5 mg  5 mg Sublingual QHS Manuelita Moxon, MD      . doxepin (SINEQUAN) capsule 25 mg  25 mg Oral QHS,MR X 1 Spencer E Simon, PA-C   25 mg at 01/18/15 2251  . [START ON 01/20/2015] FLUoxetine (PROZAC) capsule 20 mg  20 mg Oral Daily Ayush Boulet, MD      . gabapentin (NEURONTIN) capsule 600 mg  600 mg Oral QHS Charm RingsJamison Y Lord, NP   600 mg at 01/18/15 2251  . hydrOXYzine  (ATARAX/VISTARIL) tablet 25 mg  25 mg Oral TID PRN Jomarie LongsSaramma Omri Bertran, MD      . lisinopril (PRINIVIL,ZESTRIL) tablet 5 mg  5 mg Oral Daily Charm RingsJamison Y Lord, NP   5 mg at 01/19/15 0802  . magnesium hydroxide (MILK OF MAGNESIA) suspension 30 mL  30 mL Oral Daily PRN Charm RingsJamison Y Lord, NP      . naltrexone (DEPADE) tablet 25 mg  25 mg Oral Daily Charm RingsJamison Y Lord, NP   25 mg at 01/19/15 0802  . thiamine (VITAMIN B-1) tablet 100 mg  100 mg Oral Daily Charm RingsJamison Y Lord, NP   100 mg at 01/19/15 [id number]   Or  . thiamine (B-1) injection 100 mg  100 mg Intravenous Daily Charm RingsJamison Y Lord, NP       PTA Medications: Prescriptions prior to admission  Medication Sig Dispense Refill Last Dose  . albuterol (PROAIR HFA) 108 (90 Base) MCG/ACT inhaler Inhale 2 puffs into the lungs.     . mometasone-formoterol (DULERA) 100-5 MCG/ACT AERO Inhale 2 puffs into the lungs.     . naltrexone (DEPADE) 50 MG tablet      . traZODone (DESYREL) 100 MG tablet 150 mg.     . acamprosate (CAMPRAL) 333 MG tablet Take 666 mg by mouth 3 (three) times daily with meals.   01/16/2015 at Unknown time  . albuterol (PROVENTIL HFA;VENTOLIN HFA) 108 (90 BASE) MCG/ACT inhaler Inhale 2 puffs into the lungs every 4 (four) hours as needed for wheezing or shortness of breath.     Marland Kitchen. FLUoxetine (PROZAC) 10 MG capsule Take 1 capsule (10 mg total) by mouth daily. For depression 30 capsule 0 01/17/2015 at Unknown time  . FLUoxetine (PROZAC) 10 MG capsule Take 20 mg by mouth.     Marland Kitchen. FLUoxetine (PROZAC) 20 MG capsule TAKE 1 CAPSULE BY MOUTH IN THE MORNING FOR MOOD  1   . hydrOXYzine (ATARAX/VISTARIL) 50 MG tablet Take 1 tablet (50 mg total) by mouth every 6 (six) hours as needed for anxiety. 45 tablet 0 01/17/2015 at Unknown time  . hydrOXYzine (ATARAX/VISTARIL) 50 MG tablet Take 50 mg by mouth.     Marland Kitchen. lisinopril (PRINIVIL,ZESTRIL) 5 MG tablet Take 1 tablet (5 mg total) by mouth daily. For high blood pressure 30 tablet 0   . lisinopril (PRINIVIL,ZESTRIL) 5 MG tablet Take 5 mg  by  mouth daily.   01/17/2015 at 0415  . naltrexone (DEPADE) 50 MG tablet Take 0.5 tablets (25 mg total) by mouth daily. For alcoholism 30 tablet 0 01/17/2015 at Unknown time  . traZODone (DESYREL) 100 MG tablet TAKE 1 to 2 TABLETS BY MOUTH AT BEDTIME FOR INSOMNIA  1   . traZODone (DESYREL) 150 MG tablet Take 1 tablet (150 mg total) by mouth at bedtime. For sleep 30 tablet 0 01/16/2015 at Unknown time    Musculoskeletal: Strength & Muscle Tone: within normal limits Gait & Station: normal Patient leans: normal  Psychiatric Specialty Exam: Physical Exam  Constitutional:  I concur with PE done in ED.    Review of Systems  Eyes: Negative.   Psychiatric/Behavioral: Positive for depression and substance abuse. The patient is nervous/anxious.   All other systems reviewed and are negative.   Blood pressure 111/86, pulse 100, temperature 97.7 F (36.5 C), temperature source Oral, resp. rate 16, height 6' (1.829 m), weight 94.348 kg (208 lb), SpO2 98 %.Body mass index is 28.2 kg/(m^2).  General Appearance: Fairly Groomed  Patent attorney::  Fair  Speech:  Clear and Coherent  Volume:  Decreased  Mood:  Anxious and Depressed  Affect:  anxious worried  Thought Process:  Coherent and Goal Directed  Orientation:  Full (Time, Place, and Person)  Thought Content:  symptoms events worries concerns  Suicidal Thoughts:  No  Homicidal Thoughts:  No  Memory:  Immediate;   Fair Recent;   Fair Remote;   Fair  Judgement:  Fair  Insight:  Present  Psychomotor Activity:  Restlessness  Concentration:  Fair  Recall:  Poor  Fund of Knowledge:Fair  Language: Fair  Akathisia:  No  Handed:  Right  AIMS (if indicated):     Assets:  Desire for Improvement Housing Vocational/Educational  ADL's:  Intact  Cognition: WNL  Sleep:  Number of Hours: 6.75     Treatment Plan Summary:Patient with chronic alcohol use do as well as depression and anxiety, presented with hallucinations after he briefly relapsed on  alcohol over the holidays. Pt at this time appears calm , cooperative and is not observed as responding to internal stimuli. Will continue treatment.  Daily contact with patient to assess and evaluate symptoms and progress in treatment and Medication management  Patient will benefit from inpatient treatment and stabilization.  Estimated length of stay is 5-7 days.  Will continue Prozac, increase to 20 mg for affective sx. Will continue Saphris started on admission, but will reduce to Saphris 5 mg po qhs for psychosis. Will continue Doxepin 25 mg po qhs ( started as per admission orders from ED) - will get EKG since starting TCA- Pt denies hx of seizures or cardiac disease. Will continue Gabapentin 600 mg po qhs for anxiety sx. Will continue Naltrexone 25 mg po daily . Will continues medications as per MAR. Reviewed past medical records,treatment plan.  Will continue to monitor vitals ,medication compliance and treatment side effects while patient is here.  Will monitor for medical issues as well as call consult as needed.  Reviewed labs , uds- pos for BZD, BAL <5, HBA1C- 12/24/14 - 5.9, Will get TSH, lipid panel, PL ,will get EKG. CSW will start working on disposition.  Patient to participate in therapeutic milieu .       Observation Level/Precautions:  15 minute checks    Psychotherapy:  Individual/group    Consultations:  Social worker  Discharge Concerns:  Stability and safety  Estimated LOS: 3-5 days  Other:     I certify that inpatient services furnished can reasonably be expected to improve the patient's condition.   Teighlor Korson MD. 1/10/201711:30 AM

## 2015-01-19 NOTE — [facility] Counselor (Signed)
Adult Comprehensive Assessment  Patient ID: CAHLIL SATTAR, male DOB: Apr 30, 1980, 35 y.o. 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  Family Relationships: Strained at best due to pt's drinking Financial / Lack of resources (include bankruptcy): limited income.pays out of pocket for [employer]. Housing / Lack of housing:: lives in apt in [place] with roommate.  Physical health (include injuries & life threatening diseases): HTN, Anxiety and Depression Social relationships: Isolates Substance abuse: relapse 1 gallon of liquor over a two day period last week Bereavement / Loss: n/a   Living/Environment/Situation:  Living Arrangements: roommate. Apt in [place] Living conditions (as described by patient or guardian): fair; lease expires at end of Aug-may renew.  How long has patient lived in current situation?: 11 months What is atmosphere in current home: Other (Comment) safe; clean.   Family History:  Marital status: Single Does patient have children?: Yes How many children?: 1  How is patient's relationship with their children?: Patient reports good relationship w 65 YO son. Partial custody.   Childhood History:  By whom was/is the patient raised?: Both parents Additional childhood history information: Parents divorced at pt's age 58 Description of patient's relationship with caregiver when they were a child: "fine" Patient's description of current relationship with people who raised him/her: Srained with mother, father and step father Does patient have siblings?: Yes Number of Siblings: 1  Description of patient's current relationship with siblings: "Not great with older sister" Did patient suffer any verbal/emotional/physical/sexual abuse as a child?: No Did patient suffer from severe childhood  neglect?: No Has patient ever been sexually abused/assaulted/raped as an adolescent or adult?: No Was the patient ever a victim of a crime or a disaster?: No Witnessed domestic violence?: No Has patient been effected by domestic violence as an adult?: No  Education:  Highest grade of school patient has completed: 16 Currently a student?: No Contact person: [name]-STEP FATHER [phone] Learning disability?: No  Employment/Work Situation:  Employment situation: has been working for the past week as a Surveyor, minerals.  Patient's job has been impacted by current illness: Yes-missing work due to being hospitalized.  What is the longest time patient has a held a job?: 2 1/2 years  Where was the patient employed at that time?: Engineer, petroleum at [employer].  Has patient ever been in the [employer]?: No Has patient ever served in combat?: No  Financial Resources:  Financial resources: limited income from work; pays for [employer] out of pocket.   Alcohol/Substance Abuse:  What has been your use of drugs/alcohol within the last 12 months?: recent relapse.  Gallon liquor over a 2 day period approx. 10 days ago If attempted suicide, did drugs/alcohol play a role in this?: (No attempt) Alcohol/Substance Abuse Treatment Hx: Past Tx, Inpatient If yes, describe treatment: [facility], Jan 2012 [facility] and 4/13 [facility]. [facility] in 2013 for detox.  Has alcohol/substance abuse ever caused legal problems?: Yes-DUI's. No recent court dates.   Social Support System:  Patient's Community Support System: Fair Museum/gallery exhibitions officer System: Family-"My parents are pissed at me and feel like I'm never [place] get myself together."  Type of faith/religion: Grew up Christain How does patient's faith help to cope with current illness?: "very involved in high school; but have slipped away"  Leisure/Recreation:  Leisure and Hobbies: spending time with  my son.   Strengths/Needs:  What things does the patient do well?: hard worker. Try to be a good parent.  In what areas does patient struggle / problems for patient: cravings; coping skills, anxiety.   Discharge Plan:  Does patient have access to transportation?:drives and has car.  Will patient be returning to same living situation after discharge?:home to apt.  Currently receiving community mental health services: Plans to start with [facility]  Does patient have financial barriers related to discharge medications?: no.   Summary/Recommendations:  Leory PlowmanJonathan N Seifert is an 35 y.o. male. who returns to us after discharge on 12/25/14 for treatment of alcohol withdrawal related psychosis, lack of sleep, depression and anxiety.  Although his relapse was brief, he ended up having visual hallucinations, last experienced on 01/17/15.  He signed a 72 hr request on 01/18/15 at night, and plans on following up with the [facility] program.  Recommendations for pt include: crisis stabilization, medication evaluation, librium taper for withdrawals, group therapy and psychoeducation in addition to case management for discharge planning.   [facility] 01/19/2015

## 2015-01-19 NOTE — Progress Notes (Signed)
Patient 's EKG reviewed. Has Prolonged qtc. Will DC Saphris. Will hold Prozac for now. Will repeat EKG for qtc monitoring. Will discuss with patient about changing his Doxepin to another sleep aid like Ambien - since Doxepin also has cardiac effects.  Jomarie LongsSaramma Betzabe Bevans ,MD Attending Psychiatrist  [facility] 01/19/15 3:46 PM

## 2015-01-19 NOTE — [facility] Group Notes (Signed)
[facility] LCSW Group Therapy  01/19/2015 1:15 pm  Type of Therapy: Process Group Therapy  Participation Level:  Active  Participation Quality:  Appropriate  Affect:  Flat  Cognitive:  Oriented  Insight:  Improving  Engagement in Group:  Limited  Engagement in Therapy:  Limited  Modes of Intervention:  Activity, Clarification, Education, Problem-solving and Support  Summary of Progress/Problems: Today's group addressed the issue of overcoming obstacles.  Patients were asked to identify their biggest obstacle post d/c that stands in the way of their on-going success, and then problem solve as to how to manage this.  Left once, but returned.  Engaged while in group.  Identified biggest obstacle as asking for help from others besides family.  "I guess I don't want to appear incompetent."  Was asked to identify when he has asked for help in past, and he identified coming to the hospital.  Ian FlesherWent on to say it has been a helpful experience, and perhaps he could remind himself of this in the future when he is second guessing approaching someone for help.  Ian Ian [name], Ian [name] 01/19/2015   2:16 PM

## 2015-01-20 LAB — LIPID PANEL
CHOL/HDL RATIO: 3.5 ratio
Cholesterol: 141 mg/dL (ref 0–200)
HDL: 40 mg/dL — AB (ref >40)
LDL Cholesterol: 80 mg/dL (ref 0–99)
TRIGLYCERIDES: 106 mg/dL (ref <150)
VLDL: 21 mg/dL (ref 0–40)

## 2015-01-20 LAB — TSH: TSH: 1.048 u[IU]/mL (ref 0.350–4.500)

## 2015-01-20 MED ORDER — MOMETASONE FURO-FORMOTEROL FUM 100-5 MCG/ACT IN AERO
2.0000 | INHALATION_SPRAY | Freq: Two times a day (BID) | RESPIRATORY_TRACT | Status: DC
  Administered 2015-01-20 – 2015-01-21 (×2): 2 via RESPIRATORY_TRACT
  Filled 2015-01-20: qty 8.8

## 2015-01-20 MED ORDER — HYDROXYZINE HCL 50 MG PO TABS
50.0000 mg | ORAL_TABLET | Freq: Four times a day (QID) | ORAL | Status: DC | PRN
  Administered 2015-01-20 – 2015-01-21 (×2): 50 mg via ORAL
  Filled 2015-01-20 (×2): qty 1

## 2015-01-20 MED ORDER — DOXEPIN HCL 50 MG PO CAPS
50.0000 mg | ORAL_CAPSULE | Freq: Every day | ORAL | Status: DC
  Administered 2015-01-20: 50 mg via ORAL
  Filled 2015-01-20: qty 1
  Filled 2015-01-20: qty 2
  Filled 2015-01-20 (×2): qty 1

## 2015-01-20 MED ORDER — ZOLPIDEM TARTRATE 10 MG PO TABS
10.0000 mg | ORAL_TABLET | Freq: Every evening | ORAL | Status: DC | PRN
  Filled 2015-01-20: qty 1

## 2015-01-20 NOTE — Progress Notes (Signed)
Patient ID: Ian PlowmanJonathan N [name], male   DOB: 08/25/1980, 35 y.o.   MRN: [id number]  DAR: Pt. Denies SI/HI and A/V Hallucinations. He reports sleep is good, appetite is good, energy level is normal, and concentration is poor. He rates depression 4/10, hopelessness 6/10, and anxiety 6/10. Patient does not report any pain but does report shortness of breath and chest tightness throughout the day. PRN Albuterol administered to patient. EKG done and placed on chart. Patient tolerated well. Support and encouragement provided to the patient. Scheduled medications administered to patient per physician's orders. Patient is receptive and cooperative. He is seen in the milieu and is interacting with peers and staff appropriately. Q15 minute checks are maintained for safety.

## 2015-01-20 NOTE — Progress Notes (Signed)
D: Pt has anxious affect and mood.  He reports his day was "all right" and that his goal was to "go to group."   Pt denies SI/HI, denies hallucinations, denies pain.  Pt has been visible in milieu interacting with peers and staff appropriately.  Pt attended evening group.  He has withdrawal symptom of tremors.   A: Introduced self to pt.  Met with pt and offered support and encouragement.  Actively listened to pt . Medications administered per order.  PRN medication administered for anxiety and shortness of breath.  R: Pt is compliant with medications.  Pt verbally contracts for safety.  Will continue to monitor and assess.

## 2015-01-20 NOTE — [facility] Group Notes (Signed)
[facility] LCSW Group Therapy  01/20/2015 1:09 PM  Type of Therapy:  Group Therapy  Participation Level:  Active  Participation Quality:  Attentive  Affect:  Appropriate  Cognitive:  Alert and Oriented  Insight:  Improving  Engagement in Therapy:  Improving  Modes of Intervention:  Confrontation, Discussion, Education, Exploration, Problem-solving, Rapport Building, Socialization and Support  Summary of Progress/Problems: Emotion Regulation: This group focused on both positive and negative emotion identification and allowed group members to process ways to identify feelings, regulate negative emotions, and find healthy ways to manage internal/external emotions. Group members were asked to reflect on a time when their reaction to an emotion led to a negative outcome and explored how alternative responses using emotion regulation would have benefited them. Group members were also asked to discuss a time when emotion regulation was utilized when a negative emotion was experienced. [name] was attentive and engaged during today's processing group. He shared that he struggles with depression and anxiety, which often triggers relapse on alcohol. Pt shared his discharge plan of following up with CDIOP in an attempt to get control over his alcohol abuse and work on recovery from both alcohol abuse and mental illness. "I [facility] for [facility]. They help me a lot."   Smart, Jaecion Dempster LCSW 01/20/2015, 1:09 PM

## 2015-01-20 NOTE — Progress Notes (Signed)
Recreation Therapy Notes  Date: 01.11.2017  Time: 9:30am Location: [address] Group Room   Group Topic: Stress Management  Goal Area(s) Addresses:  Patient will actively participate in stress management techniques presented during session.   Behavioral Response: Appropriate, Engaged.   Intervention: Stress management techniques  Activity :  Deep Breathing, Progressive Muscle Relaxation andGuided Imagery. LRT provided instruction and demonstration on practice of Progressive Muscle Relaxation and Guided Imagery. Technique was coupled with deep breathing.   Education:  Stress Management, Discharge Planning.   Education Outcome: Acknowledges education  Clinical Observations/Feedback: Patient actively engaged in technique introduced, expressed no concerns and demonstrated ability to practice independently post d/c.   Ian [name], LRT/CTRS  Ian [name], Ian [name] 01/20/2015 6:25 PM

## 2015-01-20 NOTE — [facility] Group Notes (Signed)
[facility]   01/20/2015 10:26 AM  Participation Quality:  Appropriate   Mood/Affect:  Appropriate  Depression Rating:  4  Anxiety Rating:  7  Thoughts of Suicide:  No Will you contract for safety?   NA  Current AVH:  No  Plan for Discharge/Comments:  Pt reports that he is hoping to d/c tomorrow. He plans to [facility] [place] with [place]. Pt states that he is sleeping well. High anxiety due to having to be taken off meds due to irregular EKG and pt is anxious to be placed on meds again.   Transportation Means: unknown at this time.   Supports: some family supports   Counselling psychologistmart, Conservation officer, natureHeather LCSW

## 2015-01-20 NOTE — Plan of Care (Signed)
Problem: Alteration in thought process Goal: STG-Patient is able to sleep at least 6 hours per night Outcome: Progressing Last documented sleep time on flowsheet was 6.75 hours.

## 2015-01-20 NOTE — Progress Notes (Signed)
[facility] MD Progress Note  01/20/2015 7:01 PM Ian [name]  MRN:  [id number] Subjective:  Ian [name]'s EKG showed prolonged QT interval. His medications were held. Possible culprits; Saphris Doxepin Vistaril. Once repeated the EKG was WNL. He states he needs medications to be able to sleep otherwise he is going to be back dealing with the same situation that brought him here this time around: no sleep for 3 days with perceptual changes Principal Problem: Major depressive disorder, recurrent episode, moderate (HCC) Diagnosis:   Patient Active Problem List   Diagnosis Date Noted  . Hx of pancreatitis [Z87.19] 01/19/2015  . Prolonged Q-T interval on ECG [I45.81] 01/19/2015  . Alcohol use disorder, severe, dependence (HCC) [F10.20] 08/23/2014  . GAD (generalized anxiety disorder) [F41.1] 08/14/2013  . Major depressive disorder, recurrent episode, moderate (HCC) [F33.1] 08/14/2013  . ASTHMA [J45.909] 07/31/2008   Total Time spent with patient: 30 minutes  Past Psychiatric History: see admission H and P  Past Medical History:  Past Medical History  Diagnosis Date  . Anxiety   . Depression   . Hypertension   . Asthma   . Alcohol dependence (HCC)   . Pancreatitis     Past Surgical History  Procedure Laterality Date  . No past surgeries     Family History:  Family History  Problem Relation Age of Onset  . OCD Mother   . Heart disease Mother   . Suicidality Neg Hx    Family Psychiatric  History: see Admission H and P Social History:  History  Alcohol Use  . Yes    Comment: varies how much     History  Drug Use No    Social History   Social History  . Marital Status: Single    Spouse Name: N/A  . Number of Children: N/A  . Years of Education: N/A   Social History Main Topics  . Smoking status: Former Smoker    Types: Cigarettes    Quit date: 01/31/1999  . Smokeless tobacco: Never Used  . Alcohol Use: Yes     Comment: varies how much  . Drug Use: No  . Sexual  Activity: Yes    Birth Control/ Protection: Condom   Other Topics Concern  . None   Social History Narrative   Additional Social History:                         Sleep: Fair  Appetite:  Fair  Current Medications: Current Facility-Administered Medications  Medication Dose Route Frequency Provider Last Rate Last Dose  . acamprosate (CAMPRAL) tablet 666 mg  666 mg Oral TID WC Charm Rings, NP   666 mg at 01/20/15 1712  . acetaminophen (TYLENOL) tablet 650 mg  650 mg Oral Q6H PRN Charm Rings, NP   650 mg at 01/19/15 0931  . albuterol (PROVENTIL HFA;VENTOLIN HFA) 108 (90 Base) MCG/ACT inhaler 2 puff  2 puff Inhalation Q4H PRN Charm Rings, NP   2 puff at 01/20/15 1412  . alum & mag hydroxide-simeth (MAALOX/MYLANTA) 200-200-20 MG/5ML suspension 30 mL  30 mL Oral Q4H PRN Charm Rings, NP      . doxepin (SINEQUAN) capsule 50 mg  50 mg Oral QHS Rachael Fee, MD      . gabapentin (NEURONTIN) capsule 600 mg  600 mg Oral QHS Charm Rings, NP   600 mg at 01/19/15 2235  . hydrOXYzine (ATARAX/VISTARIL) tablet 50 mg  50 mg Oral Q6H  PRN Rachael FeeIrving A Korri Ask, MD   50 mg at 01/20/15 1712  . lisinopril (PRINIVIL,ZESTRIL) tablet 5 mg  5 mg Oral Daily Charm RingsJamison Y Lord, NP   5 mg at 01/20/15 0844  . magnesium hydroxide (MILK OF MAGNESIA) suspension 30 mL  30 mL Oral Daily PRN Charm RingsJamison Y Lord, NP      . mometasone-formoterol (DULERA) 100-5 MCG/ACT inhaler 2 puff  2 puff Inhalation BID Adonis BrookSheila Agustin, NP      . naltrexone (DEPADE) tablet 25 mg  25 mg Oral Daily Charm RingsJamison Y Lord, NP   25 mg at 01/20/15 0844  . thiamine (VITAMIN B-1) tablet 100 mg  100 mg Oral Daily Charm RingsJamison Y Lord, NP   100 mg at 01/20/15 0844  . zolpidem (AMBIEN) tablet 10 mg  10 mg Oral QHS,MR X 1 Rachael FeeIrving A Chynna Buerkle, MD        Lab Results:  Results for orders placed or performed during the hospital encounter of 01/18/15 (from the past 48 hour(s))  TSH     Status: None   Collection Time: 01/20/15  6:25 AM  Result Value Ref Range    TSH 1.048 0.350 - 4.500 uIU/mL    Comment: Performed at [facility]  Lipid panel     Status: Abnormal   Collection Time: 01/20/15  6:25 AM  Result Value Ref Range   Cholesterol 141 0 - 200 mg/dL   Triglycerides 409106 <811<150 mg/dL   HDL 40 (L) >91>40 mg/dL   Total CHOL/HDL Ratio 3.5 RATIO   VLDL 21 0 - 40 mg/dL   LDL Cholesterol 80 0 - 99 mg/dL    Comment:        Total Cholesterol/HDL:CHD Risk Coronary Heart Disease Risk Table                     Men   Women  1/2 Average Risk   3.4   3.3  Average Risk       5.0   4.4  2 X Average Risk   9.6   7.1  3 X Average Risk  23.4   11.0        Use the calculated Patient Ratio above and the CHD Risk Table to determine the patient's CHD Risk.        ATP III CLASSIFICATION (LDL):  <100     mg/dL   Optimal  [id number]  mg/dL   Near or Above                    Optimal  130-159  mg/dL   Borderline  [phone]  mg/dL   High  >657>190     mg/dL   Very High Performed at [facility]     Physical Findings: AIMS: Facial and Oral Movements Muscles of Facial Expression: None, normal Lips and Perioral Area: None, normal Jaw: None, normal Tongue: None, normal,Extremity Movements Upper (arms, wrists, hands, fingers): None, normal Lower (legs, knees, ankles, toes): None, normal, Trunk Movements Neck, shoulders, hips: None, normal, Overall Severity Severity of abnormal movements (highest score from questions above): None, normal Incapacitation due to abnormal movements: None, normal Patient's awareness of abnormal movements (rate only patient's report): No Awareness, Dental Status Current problems with teeth and/or dentures?: No Does patient usually wear dentures?: No  CIWA:  CIWA-Ar Total: 1 COWS:  COWS Total Score: 2  Musculoskeletal: Strength & Muscle Tone: within normal limits Gait & Station: normal Patient leans: normal  Psychiatric Specialty Exam: Review  of Systems  Constitutional: Negative.   HENT: Negative.   Eyes:  Negative.   Respiratory: Negative.   Cardiovascular: Negative.   Gastrointestinal: Negative.   Genitourinary: Negative.   Musculoskeletal: Negative.   Skin: Negative.   Neurological: Negative.   Endo/Heme/Allergies: Negative.   Psychiatric/Behavioral: Positive for substance abuse. The patient is nervous/anxious and has insomnia.     Blood pressure 115/87, pulse 92, temperature 97.5 F (36.4 C), temperature source Oral, resp. rate 16, height 6' (1.829 m), weight 94.348 kg (208 lb), SpO2 98 %.Body mass index is 28.2 kg/(m^2).  General Appearance: Fairly Groomed  Patent attorney::  Fair  Speech:  Clear and Coherent  Volume:  fluctuates  Mood:  Anxious  Affect:  anxious worried  Thought Process:  Coherent and Goal Directed  Orientation:  Full (Time, Place, and Person)  Thought Content:  symptoms events worries concerns  Suicidal Thoughts:  No  Homicidal Thoughts:  No  Memory:  Immediate;   Fair Recent;   Fair Remote;   Fair  Judgement:  Fair  Insight:  Present  Psychomotor Activity:  Restlessness  Concentration:  Fair  Recall:  [employer] of Knowledge:Fair  Language: Fair  Akathisia:  No  Handed:  Right  AIMS (if indicated):     Assets:  Desire for Improvement Housing  ADL's:  Intact  Cognition: WNL  Sleep:  Number of Hours: 6.5   Treatment Plan Summary: Daily contact with patient to assess and evaluate symptoms and progress in treatment and Medication management Supportive approach/coping skills Alcohol dependence; continue to work a relapse prevention plan Anxiety; will resume the Vistaril 50 mg PRN Insomnia; will resume the Doxepin 50 mg HS  Prolonged QT interval; Saphris the most probable culprit  Use CBT/mindfulness Tylar Merendino A 01/20/2015, 7:01 PM

## 2015-01-21 ENCOUNTER — Other Ambulatory Visit (HOSPITAL_COMMUNITY): Payer: BLUE CROSS/BLUE SHIELD | Admitting: Licensed Clinical Social Worker

## 2015-01-21 DIAGNOSIS — F1020 Alcohol dependence, uncomplicated: Secondary | ICD-10-CM

## 2015-01-21 LAB — PROLACTIN: PROLACTIN: 14.2 ng/mL (ref 4.0–15.2)

## 2015-01-21 MED ORDER — GABAPENTIN 300 MG PO CAPS: 600.0000 mg | ORAL_CAPSULE | Freq: Every day | ORAL | Status: DC

## 2015-01-21 MED ORDER — ZOLPIDEM TARTRATE 10 MG PO TABS: 10.0000 mg | ORAL_TABLET | Freq: Every evening | ORAL | Status: DC | PRN

## 2015-01-21 MED ORDER — DOXEPIN HCL 50 MG PO CAPS: 50.0000 mg | ORAL_CAPSULE | Freq: Every day | ORAL | Status: DC

## 2015-01-21 MED ORDER — MOMETASONE FURO-FORMOTEROL FUM 100-5 MCG/ACT IN AERO: 2.0000 | INHALATION_SPRAY | Freq: Two times a day (BID) | RESPIRATORY_TRACT | Status: DC

## 2015-01-21 MED ORDER — NALTREXONE HCL 50 MG PO TABS: 25.0000 mg | ORAL_TABLET | Freq: Every day | ORAL | Status: DC

## 2015-01-21 MED ORDER — ACAMPROSATE CALCIUM 333 MG PO TBEC: 666.0000 mg | DELAYED_RELEASE_TABLET | Freq: Three times a day (TID) | ORAL | Status: DC

## 2015-01-21 NOTE — Progress Notes (Signed)
  [facility] :  Will you be returning to the same living situation after discharge:  Yes,  home At discharge, do you have transportation home?: Yes,  car in parking lot Do you have the ability to pay for your medications: Yes,  [employer] private insurance  Release of information consent forms completed and submitted to medical records by CSW.  Patient to Follow up at: Follow-up Information    Follow up with [facility] On 01/21/2015.   Why:  Please attend today's CDIOP group from 1pm-4pm after discharging from the hospital. Ian SandyBeth is aware that you are planning to attend. Thank you.    Contact information:   [address] [place], [place] [address] Phone: [phone] Ian [name] phone: [phone] Fax: [phone]      Follow [facility] with [facility].   Why:  Please resume counseling and medication management with this provider after completing [facility] at [facility]. Thank you.    Contact information:   [address] [place], [place] [address] Phone: [phone] Fax: [phone]      Next level of care provider has access to [facility] Link:no  Safety Planning and Suicide Prevention discussed: Yes,  SPE completed with pt, as he refused to consent to family contact. SPI pamphlet and mobile crisis information provided to pt.   Have you used any form of tobacco in the last 30 days? (Cigarettes, Smokeless Tobacco, Cigars, and/or Pipes): No  Has patient been referred to the Quitline?: N/A patient is not a smoker  Patient has been referred for addiction treatment: Yes-see above.   Smart, Jerilynn Feldmeier LCSW 01/21/2015, 9:41 AM

## 2015-01-21 NOTE — [facility] Suicide Risk Assessment (Signed)
[facility] Discharge Suicide Risk Assessment   Demographic Factors:  Male and Caucasian  Total Time spent with patient: 30 minutes  Musculoskeletal: Strength & Muscle Tone: within normal limits Gait & Station: normal Patient leans: normal  Psychiatric Specialty Exam: Physical Exam  Review of Systems  Constitutional: Negative.   HENT: Negative.   Eyes: Negative.   Respiratory: Negative.   Cardiovascular: Negative.   Gastrointestinal: Negative.   Genitourinary: Negative.   Musculoskeletal: Negative.   Skin: Negative.   Neurological: Negative.   Endo/Heme/Allergies: Negative.   Psychiatric/Behavioral: Positive for substance abuse. The patient is nervous/anxious.     Blood pressure 115/82, pulse 92, temperature 97.9 F (36.6 C), temperature source Oral, resp. rate 18, height 6' (1.829 m), weight 94.348 kg (208 lb), SpO2 98 %.Body mass index is 28.2 kg/(m^2).  General Appearance: Fairly Groomed  Patent attorney::  Fair  Speech:  Clear and Coherent409  Volume:  Normal  Mood:  Euthymic  Affect:  Appropriate  Thought Process:  Coherent and Goal Directed  Orientation:  Full (Time, Place, and Person)  Thought Content:  symptoms events worries concerns  Suicidal Thoughts:  No  Homicidal Thoughts:  No  Memory:  Immediate;   Fair Recent;   Fair Remote;   Fair  Judgement:  Fair  Insight:  Present  Psychomotor Activity:  Normal  Concentration:  Fair  Recall:  [employer] of Knowledge:Fair  Language: Fair  Akathisia:  No  Handed:  Right  AIMS (if indicated):     Assets:  Desire for Improvement Housing Social Support Talents/Skills  Sleep:  Number of Hours: 6.5  Cognition: WNL  ADL's:  Intact   Have you used any form of tobacco in the last 30 days? (Cigarettes, Smokeless Tobacco, Cigars, and/or Pipes): No  Has this patient used any form of tobacco in the last 30 days? (Cigarettes, Smokeless Tobacco, Cigars, and/or Pipes) No  Mental Status Per Nursing Assessment::   On Admission:   NA  Current Mental Status by Physician: In full contact with reality. There are no active S/S of withdrawal. There are no active SI plans or intent. He is going to continue to work in the CD IOP on long term abstinence. His QT interval went back to normal off the Saphris. The other medications were resumed   Loss Factors: NA  Historical Factors: NA  Risk Reduction Factors:   Sense of responsibility to family and Positive social support  Continued Clinical Symptoms:  Alcohol/Substance Abuse/Dependencies  Cognitive Features That Contribute To Risk:  Closed-mindedness, Polarized thinking and Thought constriction (tunnel vision)    Suicide Risk:  Minimal: No identifiable suicidal ideation.  Patients presenting with no risk factors but with morbid ruminations; may be classified as minimal risk based on the severity of the depressive symptoms  Principal Problem: Major depressive disorder, recurrent episode, moderate (HCC) Discharge Diagnoses:  Patient Active Problem List   Diagnosis Date Noted  . Hx of pancreatitis [Z87.19] 01/19/2015  . Prolonged Q-T interval on ECG [I45.81] 01/19/2015  . Alcohol use disorder, severe, dependence (HCC) [F10.20] 08/23/2014  . GAD (generalized anxiety disorder) [F41.1] 08/14/2013  . Major depressive disorder, recurrent episode, moderate (HCC) [F33.1] 08/14/2013  . ASTHMA [J45.909] 07/31/2008    Follow-up Information    Follow up with [facility] On 01/21/2015.   Why:  Please attend today's CDIOP group from 1pm-4pm after discharging from the hospital. Waynetta Sandy is aware that you are planning to attend. Thank you.    Contact information:   [address]  [address] [place], [place] [address] Phone: [phone] Beth phone: [phone] Fax: [phone]      [facility].   Why:  Please resume counseling and medication management with this provider after [facility]. Thank you.    Contact information:   [address] [place], [place] [address] Phone: [phone] Fax: [phone]      Plan Of Care/Follow-up recommendations:  Activity:  as tolerated Diet:  regular Follow up as above Is patient on multiple antipsychotic therapies at discharge:  No   Has Patient had three or more failed trials of antipsychotic monotherapy by history:  No  Recommended Plan for Multiple Antipsychotic Therapies: NA    Ibraheem Voris A 01/21/2015, 12:50 PM

## 2015-01-21 NOTE — Progress Notes (Signed)
Pt has been observed in the dayroom most of the evening.  He reports he had a fairly good day.  He denies SI/HI/AVH at this time.  He denies any withdrawal symptoms.  He states that he will probably be discharge tomorrow to home.  He reports that he attended groups today and intends to stay sober.  He wants to return to the same counseling facility [facility].  He states that he had some problem with his EKG and the MD made a change to his sleep aid, so he hopes that it will not affect his discharge plans.  Pt makes his needs and concerns known to staff.  Support and encouragement offered.  Safety maintained with q15 minute checks.

## 2015-01-21 NOTE — Discharge Summary (Signed)
Physician Discharge Summary Note  Patient:  Ian [name] is an 35 y.o., male MRN:  [id number] DOB:  10/10/1980 Patient phone:  [phone] (home)  Patient address:   [address] [place] [address],  Total Time spent with patient: 30 minutes  Date of Admission:  01/18/2015 Date of Discharge: 01/21/2015  Reason for Admission:  Depression  Principal Problem: Major depressive disorder, recurrent episode, moderate (HCC) Discharge Diagnoses: Patient Active Problem List   Diagnosis Date Noted  . Hx of pancreatitis [Z87.19] 01/19/2015  . Prolonged Q-T interval on ECG [I45.81] 01/19/2015  . Alcohol use disorder, severe, dependence (HCC) [F10.20] 08/23/2014  . GAD (generalized anxiety disorder) [F41.1] 08/14/2013  . Major depressive disorder, recurrent episode, moderate (HCC) [F33.1] 08/14/2013  . ASTHMA [J45.909] 07/31/2008    Past Psychiatric History:  See above  Past Medical History:  Past Medical History  Diagnosis Date  . Anxiety   . Depression   . Hypertension   . Asthma   . Alcohol dependence (HCC)   . Pancreatitis     Past Surgical History  Procedure Laterality Date  . No past surgeries     Family History:  Family History  Problem Relation Age of Onset  . OCD Mother   . Heart disease Mother   . Suicidality Neg Hx    Family Psychiatric  History:  Denies Social History:  History  Alcohol Use  . Yes    Comment: varies how much     History  Drug Use No    Social History   Social History  . Marital Status: Single    Spouse Name: N/A  . Number of Children: N/A  . Years of Education: N/A   Social History Main Topics  . Smoking status: Former Smoker    Types: Cigarettes    Quit date: 01/31/1999  . Smokeless tobacco: Never Used  . Alcohol Use: Yes     Comment: varies how much  . Drug Use: No  . Sexual Activity: Yes    Birth Control/ Protection: Condom   Other Topics Concern  . None   Social History Narrative    Hospital  Course:  Ian [name] was admitted for Major depressive disorder, recurrent episode, moderate (HCC) and crisis management.  He was treated with the following medications as listed below.  Ian [name] was discharged with current medication and was instructed on how to take medications as prescribed; (details listed below under Medication List).  Medical problems were identified and treated as needed.  Home medications were restarted as appropriate.  Improvement was monitored by observation and Ian [name] daily report of symptom reduction.  Emotional and mental status was monitored by daily self-inventory reports completed by Ian [name] and clinical staff.         Ian [name] was evaluated by the treatment team for stability and plans for continued recovery upon discharge.  Ian [name] motivation was an integral factor for scheduling further treatment.  Employment, transportation, bed availability, health status, family support, and any pending legal issues were also considered during his hospital stay.  He was offered further treatment options upon discharge including but not limited to Residential, Intensive Outpatient, and Outpatient treatment.  Ian [name] will follow up with the services as listed below under Follow Up Information.     Upon completion of this admission the Ian [name] was both mentally and medically stable for discharge denying suicidal/homicidal ideation, auditory/visual/tactile hallucinations, delusional thoughts and  paranoia.     Physical Findings: AIMS: Facial and Oral Movements Muscles of Facial Expression: None, normal Lips and Perioral Area: None, normal Jaw: None, normal Tongue: None, normal,Extremity Movements Upper (arms, wrists, hands, fingers): None, normal Lower (legs, knees, ankles, toes): None, normal, Trunk Movements Neck, shoulders, hips: None, normal, Overall Severity Severity of abnormal movements  (highest score from questions above): None, normal Incapacitation due to abnormal movements: None, normal Patient's awareness of abnormal movements (rate only patient's report): No Awareness, Dental Status Current problems with teeth and/or dentures?: No Does patient usually wear dentures?: No  CIWA:  CIWA-Ar Total: 1 COWS:  COWS Total Score: 2  Musculoskeletal: Strength & Muscle Tone: within normal limits Gait & Station: normal Patient leans: N/A  Psychiatric Specialty Exam:  SEE MD SRA Review of Systems  All other systems reviewed and are negative.   Blood pressure 115/82, pulse 92, temperature 97.9 F (36.6 C), temperature source Oral, resp. rate 18, height 6' (1.829 m), weight 94.348 kg (208 lb), SpO2 98 %.Body mass index is 28.2 kg/(m^2).  Have you used any form of tobacco in the last 30 days? (Cigarettes, Smokeless Tobacco, Cigars, and/or Pipes): No  Has this patient used any form of tobacco in the last 30 days? (Cigarettes, Smokeless Tobacco, Cigars, and/or Pipes) Yes, na  Metabolic Disorder Labs:  Lab Results  Component Value Date   HGBA1C 5.9* 12/24/2014   MPG 123 12/24/2014   MPG 111 08/14/2013   Lab Results  Component Value Date   PROLACTIN 14.2 01/20/2015   Lab Results  Component Value Date   CHOL 141 01/20/2015   TRIG 106 01/20/2015   HDL 40* 01/20/2015   CHOLHDL 3.5 01/20/2015   VLDL 21 01/20/2015   LDLCALC 80 01/20/2015   LDLCALC 70 08/14/2013    See Psychiatric Specialty Exam and Suicide Risk Assessment completed by Attending Physician prior to discharge.  Discharge destination:  Home  Is patient on multiple antipsychotic therapies at discharge:  No   Has Patient had three or more failed trials of antipsychotic monotherapy by history:  No  Recommended Plan for Multiple Antipsychotic Therapies: NA  Discharge Instructions    Diet - low sodium heart healthy    Complete by:  As directed      Increase activity slowly    Complete by:  As directed              Medication List    STOP taking these medications        albuterol 108 (90 Base) MCG/ACT inhaler  Commonly known as:  PROVENTIL HFA;VENTOLIN HFA     FLUoxetine 10 MG capsule  Commonly known as:  PROZAC     FLUoxetine 20 MG capsule  Commonly known as:  PROZAC     hydrOXYzine 50 MG tablet  Commonly known as:  ATARAX/VISTARIL     lisinopril 5 MG tablet  Commonly known as:  PRINIVIL,ZESTRIL     PROAIR HFA 108 (90 Base) MCG/ACT inhaler  Generic drug:  albuterol     traZODone 100 MG tablet  Commonly known as:  DESYREL     traZODone 150 MG tablet  Commonly known as:  DESYREL      TAKE these medications      Indication   acamprosate 333 MG tablet  Commonly known as:  CAMPRAL  Take 2 tablets (666 mg total) by mouth 3 (three) times daily with meals.   Indication:  Excessive Use of Alcohol     doxepin 50 MG capsule  Commonly known as:  SINEQUAN  Take 1 capsule (50 mg total) by mouth at bedtime.   Indication:  Major Depressive Disorder     gabapentin 300 MG capsule  Commonly known as:  NEURONTIN  Take 2 capsules (600 mg total) by mouth at bedtime.   Indication:  Agitation, Alcohol Withdrawal Syndrome, Trouble Sleeping     mometasone-formoterol 100-5 MCG/ACT Aero  Commonly known as:  DULERA  Inhale 2 puffs into the lungs 2 (two) times daily.      naltrexone 50 MG tablet  Commonly known as:  DEPADE  Take 0.5 tablets (25 mg total) by mouth daily.   Indication:  Excessive Use of Alcohol     zolpidem 10 MG tablet  Commonly known as:  AMBIEN  Take 1 tablet (10 mg total) by mouth at bedtime and may repeat dose one time if needed.   Indication:  Trouble Sleeping           Follow-up Information    Follow up with [facility] On 01/21/2015.   Why:  Please attend today's CDIOP group from 1pm-4pm after discharging from the hospital. Waynetta SandyBeth is aware that you are planning to attend. Thank you.    Contact information:   [address] [place], [place]  [address] Phone: [phone] Beth phone: [phone] Fax: [phone]      [facility] with [facility].   Why:  Please resume counseling and medication management with this provider after [facility]. Thank you.    Contact information:   [address] [place], [place] [address] Phone: [phone] Fax: [phone]      Follow-up recommendations:  Activity:  as tol Diet:  as tol  Comments:  1.  Take all your medications as prescribed.              2.  Report any adverse side effects to outpatient provider.                       3.  Patient instructed to not use alcohol or illegal drugs while on prescription medicines.            4.  In the event of worsening symptoms, instructed patient to call 911, the crisis hotline or go to nearest emergency room for evaluation of symptoms.  Signed: Velna HatchetSheila May Agustin AGNP-BC 01/21/2015, 9:45 AM  I personally assessed the patient and formulated the plan Madie RenoIrving A. Dub MikesLugo, M.D.

## 2015-01-21 NOTE — Progress Notes (Signed)
Discharge Note:  Patient discharged to outpatient therapy.  Patient denied SI and HI.  Denied A/V hallucinations.  Patient received all his belongings, clothing, toiletries, misc items, prescriptions, medications, wallet with cards, tennis shoes, phone charger cord, keys, money, sweat shirt, clock black phone, phone protector cover, etc.  Suicide prevention information given and discussed with patient who stated he understood and had no questions.  Patient stated he [facility] all assistance [facility].

## 2015-01-21 NOTE — [facility] Group Notes (Signed)

## 2015-01-21 NOTE — Progress Notes (Signed)
D:  Patient's self inventory sheet, patient sleeps good, sleep medication is helpful.  Good appetite, normal energy level, good concentration.  Rated depression, hopeless and anxiety #4.  Denied withdrawals.  Denied SI.  Denied physical problems. Denied pain.  Goal is QTC and meds.  Plans to talk to Dr. Dub MikesLugo.  "Make sure I am on right meds and sleeping.  Sleeping was my problem and got me here."  Does have discharge plans. A:  Medications administered per MD orders.  Emotional support and encouragement given patient. R:  Denied SI and HI, contracts for safety.  Denied A/V hallucinations.  Safety maintained with 15 minute checks.

## 2015-01-21 NOTE — Tx Team (Signed)
Interdisciplinary Treatment Plan Update (Adult)  Date:  01/21/2015   Time Reviewed:  9:42 AM   Progress in Treatment: Attending groups: Yes. Participating in groups:  Yes. Taking medication as prescribed:  Yes. Tolerating medication:  Yes. Family/Significant other contact made:  Pt refused to consent to family contact. SPE completed with pt.  Patient understands diagnosis:  Yes  As evidenced by seeking help with psychosis, depression and anxiety Discussing patient identified problems/goals with staff:  Yes, see initial care plan. Medical problems stabilized or resolved:  Yes. Denies suicidal/homicidal ideation: Yes. Issues/concerns per patient self-inventory:  No. Other:  Discharge Plan or Barriers: CDIOP with [facility] outpatient. Pt also wants records sent to [facility] counseling and plans to continue followup with them after completing CDIOP.   Reason for Continuation of Hospitalization: none  Comments:  Patient with chronic alcohol use do as well as depression and anxiety, presented with hallucinations after he briefly relapsed on alcohol over the holidays. Pt at this time appears calm , cooperative and is not observed as responding to internal stimuli.  Patient will benefit from inpatient treatment and stabilization.  Estimated length of stay is 2-3 days. Will continue Prozac, increase to 20 mg for affective sx. Will d/c doxapine and discuss another sleep med with pt as QTC is increased. Will continue Gabapentin 600 mg po qhs for anxiety sx. Will continue Naltrexone 25 mg po daily .  Estimated length of stay: d/c today   Review of initial/current patient goals per problem list:   Review of initial/current patient goals per problem list:  1. Goal(s): Patient will participate in aftercare plan   Met: Yes   Target date: 3-5 days post admission date   As evidenced by: Patient will participate within aftercare plan AEB aftercare provider and housing plan at  discharge being identified.  01/21/2015: Plans to return home, follow up with [facility] CDIOP program  [Beth alerted]   2. Goal (s): Patient will exhibit decreased depressive symptoms and suicidal ideations.   Met: Yes   Target date: 3-5 days post admission date   As evidenced by: Patient will utilize self rating of depression at 3 or below and demonstrate decreased signs of depression or be deemed stable for discharge by MD. 01/19/15:  Rates depression a 5 today 01/21/15: Pt rates depression as 0/10 and present with pleasant mood and calm affect.   3. Goal(s): Patient will demonstrate decreased signs and symptoms of anxiety.   Met: Yes   Target date: 3-5 days post admission date   As evidenced by: Patient will utilize self rating of anxiety at 3 or below and demonstrated decreased signs of anxiety, or be deemed stable for discharge by MD 01/19/15:  Rates his anxiety a 7; is requiring PRN vistaril for stabilization 01/21/15: Rates anxiety as 3/10 and presents with pleasant mood/calm affect.   4. Goal(s): Patient will demonstrate decreased signs of withdrawal due to substance abuse   Met: Yes   Target date: 3-5 days post admission date   As evidenced by: Patient will produce a CIWA/COWS score of 0, have stable vitals signs, and no symptoms of withdrawal 01/19/15: CIWA score of 1; vitals are stable; is not requiring librium taper.   6. Goal (s): Patient will demonstrate decreased signs of mania  * Met: Yes  * Target date: 3-5 days post admission date  * As evidenced by: Patient demonstrate decreased signs of mania AEB decreased mood instability and return to baseline functioning 01/18/14:  Psychosis was related to alcohol  withdrawal.  No signs nor symptoms today.      Attendees: Patient:  01/21/2015 9:42 AM   Family:   01/21/2015 9:42 AM   Physician:  Carlton Adam MD  01/21/2015 9:42 AM   Nursing:   Leisa Lenz RN  01/21/2015 9:42 AM   CSW:  Maxie Better, LCSW    01/21/2015 9:42 AM   Other: Erasmo Downer Drinkard, LCSWA; Peri Maris LCSWA 01/21/2015 9:42 AM   Other:   01/21/2015 9:42 AM   Other:  Lars Pinks, Nurse CM 01/21/2015 9:42 AM   Other:   01/21/2015 9:42 AM   Other:  Agustina Caroli NP  01/21/2015 9:42 AM   Other:  01/21/2015 9:42 AM   Other:  01/21/2015 9:42 AM   Other:  01/21/2015 9:42 AM   Other:  01/21/2015 9:42 AM   Other:  01/21/2015 9:42 AM   Other:   01/21/2015 9:42 AM    Scribe for Treatment Team:   Maxie Better, MSW, LCSW Clinical Social Worker 01/21/2015 9:45 AM

## 2015-01-22 ENCOUNTER — Encounter (HOSPITAL_COMMUNITY): Payer: Self-pay | Admitting: Licensed Clinical Social Worker

## 2015-01-22 NOTE — Progress Notes (Signed)
    Daily Group Progress Note  Program: CD-IOP   Group Time: 1-2:30   Participation Level: Active  Behavioral Response: Appropriate and Sharing  Type of Therapy: Process Group  Topic: The first part of group was spent in process where group members shared about obstacles, challenges and recovery-related activities since the last group. There was good disclosure and feedback among group members. Group members also shared any urges or cravings they had experienced. There were 2 new patients who began the program today. They both introduced themselves and everyone welcomed them.     Group Time: 2:45-4  Participation Level: ActiveBehavioral Response: Appropriate and Sharing  Type of Therapy: Psycho-education Group  Topic:The second part of group focused on "Goal setting for recovery." The discussion began with group members identifying values which are important in their lives.  Next group members worked on goal setting which clarify the values they have chosen. Each group member set SMART goals (specific, meaningful, adaptive, realistic and time-bound).  There was good discussion and revelations from all group members. Group members were open and honest during the intervention.       Summary: Pt began the CD-IOP program today. [facility] He had been without sleep for approximately 6 days and was delusional, presenting at the ED then [facility]. He appeared open and honest in sharing his history of drinking and the many consequences he had experienced due to his continued drinking. [facility] hour before group began. He quickly identified his top 5 values with the # 1 value: to maintain sobriety. The patient identified immediate, short-term, medium and long-term goals which were in keeping with what he values most in his life: call his son and son's mother to let them know [facility], go to a AA meeting tonight, call fellow sober  men, have more interaction with his son and become a sponsor advocate to "[facility]" to [facility].   Pt was open and honest while participating in the intervention. His sobriety date is 1/1.    Family Program: Family present? No   Name of family member(s):   UDS collected: No Results:   AA/NA attended?: YesMonday, Tuesday and Wednesday  Sponsor?: No   Carl Butner S, Licensed Cli

## 2015-01-25 ENCOUNTER — Other Ambulatory Visit (HOSPITAL_COMMUNITY): Payer: BLUE CROSS/BLUE SHIELD | Admitting: Medical

## 2015-01-25 NOTE — Progress Notes (Signed)
Ian [name] is a 36 y.o. male patient. CD-IOP Orientation: The patient is a 35 yo single, Caucasian male seeking entry into the CD-IOP program. Patient was referred from [facility] detox. Pt was released 12/21/14 and came in for orientation on 01/15/15. Pt was to begin CD-IOP on Monday 1/9 but Saturday 1/7 pt presented at [facility] for auditory and visual hallucinations, and severe anxiety, but no alcohol use. He was admitted to [facility] and was released Thursday 1/12 in time to walk downstairs for CD-IOP. Pt began drinking at the age of 28. He experimented with cocaine and marijuana in college but no use since that time. Pt reports he drinks a fifth of vodka 5-6 times per week. He lives alone in [place] but has visitation rights with his31 yo son. Due to previous allegations of alcohol abuse around his son pt carries a breathalyzer that he must blow into every 6 hours, one day prior to seeing son and during visitation with son. The results of the breathalyzer are sent to a monitoring agency. The pt had a DWI in2007 and 2013. His son was in the car during the 2013 DWI arrest, hence the breathalyzer. Pt went inpatient at [facility] August 2011, then completed IOP and sober housing. He stayed sober 2-3 months afterwards. He went to [facility] in [place] October 2013 and was sober for 14 months. He has been to [facility] approximately 6 times. The pt has an irregular employment history but had a job at [employer] but lost it due to not showing up for work. Pt went to [employer] [facility]. His girlfriend got pregnant. 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 He and the baby's mother have been in/out of family court, each trying to gain custody.  Pt reports no family hx of addiction on either side of his family, but his mother has a dx of OCD and anxiety. Pt reports his family is tired of him and wants nothing to do with him. His mother and stepfather live in town. He has 1 sister Juliette AlcideMelinda who is 89 but she is angry with him due to what he has put his family through due to his alcoholism. He has limited contact with his biological father. Pt goes to [facility] and sees therapist Rudi CocoBeth Pascal and NP Hazel SamsValerie Lavoie. Pt gets a monthly Vivatrol shot with next injection due end of the month.  Pt is also prescribed Campral.  Pt has high liver enzymes and pancreatitis January 2016. Pt has few friends due to all his friends drink. He has been in/out of AA previously and had a sponsor but at last relapse the sponsor dropped him. All paperwork was reviewed and pt began CD-IOP 1/12. His sobriety date is 12/31.          [name],[name], Licensed Cli

## 2015-01-26 ENCOUNTER — Telehealth (HOSPITAL_COMMUNITY): Payer: Self-pay | Admitting: Licensed Clinical Social Worker

## 2015-01-27 ENCOUNTER — Other Ambulatory Visit (HOSPITAL_COMMUNITY): Payer: BLUE CROSS/BLUE SHIELD | Admitting: Medical

## 2015-01-28 ENCOUNTER — Other Ambulatory Visit (HOSPITAL_COMMUNITY): Payer: BLUE CROSS/BLUE SHIELD

## 2015-02-01 ENCOUNTER — Encounter (HOSPITAL_COMMUNITY): Payer: Self-pay | Admitting: Psychology

## 2015-02-01 ENCOUNTER — Other Ambulatory Visit (HOSPITAL_COMMUNITY): Payer: BLUE CROSS/BLUE SHIELD

## 2015-02-01 NOTE — Progress Notes (Signed)
Ian [name] is a 35 y.o. male patient. CD-IOP: Unsuccessful Discharge. The patient is being discharged from the CD-IOP program today. He has missed 3 consecutive group sessions and has not responded to our repeated phone calls and voice mail messages. It seems likely he is not interested in pursuing additional treatment to address his chemical dependency. He was referred by the staff upstairs while he was in detox in [facility]. The patient only attended one group session and never returned. Discharged unsuccessful.        Jonessa Triplett, LCAS

## 2015-02-03 ENCOUNTER — Other Ambulatory Visit (HOSPITAL_COMMUNITY): Payer: BLUE CROSS/BLUE SHIELD | Attending: Medical | Admitting: Medical

## 2015-02-03 ENCOUNTER — Other Ambulatory Visit (HOSPITAL_COMMUNITY): Payer: BLUE CROSS/BLUE SHIELD | Admitting: Psychology

## 2015-02-03 ENCOUNTER — Other Ambulatory Visit (HOSPITAL_COMMUNITY): Payer: Self-pay | Admitting: Medical

## 2015-02-03 ENCOUNTER — Encounter (HOSPITAL_COMMUNITY): Payer: Self-pay | Admitting: Emergency Medicine

## 2015-02-03 ENCOUNTER — Emergency Department (HOSPITAL_COMMUNITY)
Admission: EM | Admit: 2015-02-03 | Discharge: 2015-02-04 | Disposition: A | Discharge status: Home / Self Care | Payer: BLUE CROSS/BLUE SHIELD | Attending: Emergency Medicine | Admitting: Emergency Medicine

## 2015-02-03 ENCOUNTER — Encounter (HOSPITAL_COMMUNITY): Payer: Self-pay | Admitting: Medical

## 2015-02-03 ENCOUNTER — Other Ambulatory Visit: Payer: Self-pay

## 2015-02-03 DIAGNOSIS — I10 Essential (primary) hypertension: Secondary | ICD-10-CM | POA: Diagnosis not present

## 2015-02-03 DIAGNOSIS — T43595A Adverse effect of other antipsychotics and neuroleptics, initial encounter: Secondary | ICD-10-CM | POA: Insufficient documentation

## 2015-02-03 DIAGNOSIS — Z87891 Personal history of nicotine dependence: Secondary | ICD-10-CM | POA: Insufficient documentation

## 2015-02-03 DIAGNOSIS — F329 Major depressive disorder, single episode, unspecified: Secondary | ICD-10-CM | POA: Insufficient documentation

## 2015-02-03 DIAGNOSIS — Z8719 Personal history of other diseases of the digestive system: Secondary | ICD-10-CM

## 2015-02-03 DIAGNOSIS — R002 Palpitations: Secondary | ICD-10-CM | POA: Diagnosis present

## 2015-02-03 DIAGNOSIS — Z7951 Long term (current) use of inhaled steroids: Secondary | ICD-10-CM | POA: Insufficient documentation

## 2015-02-03 DIAGNOSIS — T50905A Adverse effect of unspecified drugs, medicaments and biological substances, initial encounter: Secondary | ICD-10-CM

## 2015-02-03 DIAGNOSIS — F419 Anxiety disorder, unspecified: Secondary | ICD-10-CM | POA: Insufficient documentation

## 2015-02-03 DIAGNOSIS — R45851 Suicidal ideations: Secondary | ICD-10-CM | POA: Diagnosis not present

## 2015-02-03 DIAGNOSIS — J45909 Unspecified asthma, uncomplicated: Secondary | ICD-10-CM | POA: Insufficient documentation

## 2015-02-03 DIAGNOSIS — F331 Major depressive disorder, recurrent, moderate: Secondary | ICD-10-CM | POA: Diagnosis not present

## 2015-02-03 DIAGNOSIS — F411 Generalized anxiety disorder: Secondary | ICD-10-CM | POA: Diagnosis not present

## 2015-02-03 DIAGNOSIS — F10229 Alcohol dependence with intoxication, unspecified: Secondary | ICD-10-CM

## 2015-02-03 DIAGNOSIS — F1020 Alcohol dependence, uncomplicated: Secondary | ICD-10-CM | POA: Insufficient documentation

## 2015-02-03 DIAGNOSIS — Z79899 Other long term (current) drug therapy: Secondary | ICD-10-CM | POA: Diagnosis not present

## 2015-02-03 DIAGNOSIS — F10282 Alcohol dependence with alcohol-induced sleep disorder: Secondary | ICD-10-CM | POA: Insufficient documentation

## 2015-02-03 DIAGNOSIS — G4700 Insomnia, unspecified: Secondary | ICD-10-CM | POA: Insufficient documentation

## 2015-02-03 LAB — BASIC METABOLIC PANEL
ANION GAP: 11 (ref 5–15)
BUN: 16 mg/dL (ref 6–20)
CHLORIDE: 103 mmol/L (ref 101–111)
CO2: 20 mmol/L — AB (ref 22–32)
Calcium: 9.1 mg/dL (ref 8.9–10.3)
Creatinine, Ser: 0.84 mg/dL (ref 0.61–1.24)
GFR calc Af Amer: >60 mL/min (ref >60)
GFR calc non Af Amer: >60 mL/min (ref >60)
GLUCOSE: 164 mg/dL — AB (ref 65–99)
POTASSIUM: 3.6 mmol/L (ref 3.5–5.1)
Sodium: 134 mmol/L — ABNORMAL LOW (ref 135–145)

## 2015-02-03 LAB — CBC
HEMATOCRIT: 42.1 % (ref 39.0–52.0)
HEMOGLOBIN: 14.7 g/dL (ref 13.0–17.0)
MCH: 32.2 pg (ref 26.0–34.0)
MCHC: 34.9 g/dL (ref 30.0–36.0)
MCV: 92.1 fL (ref 78.0–100.0)
Platelets: 177 10*3/uL (ref 150–400)
RBC: 4.57 MIL/uL (ref 4.22–5.81)
RDW: 13.8 % (ref 11.5–15.5)
WBC: 8.6 10*3/uL (ref 4.0–10.5)

## 2015-02-03 LAB — I-STAT TROPONIN, ED: Troponin i, poc: 0 ng/mL (ref 0.00–0.08)

## 2015-02-03 MED ORDER — QUETIAPINE FUMARATE 25 MG PO TABS: ORAL_TABLET | ORAL | Status: DC

## 2015-02-03 NOTE — Progress Notes (Signed)
Psychiatric Initial Adult Assessment   Patient Identification: Ian [name] MRN:  [id number] Date of Evaluation:  02/03/2015 Referral Source: [facility] In[facility] Dr Sabra Heck Subjective:"Alcohol;insomnia;depression ;anxiety" (had anxiety and depression before started de rinking) Chief Complaint:   Chief Complaint    Establish Care; Alcohol Problem     Visit Diagnosis:    ICD-9-CM ICD-10-CM   1. Alcohol use disorder, severe, dependence ([place]) 303.90 F10.20   2. Alcohol dependence with alcohol-induced sleep disorder ([place]) 303.90 F10.282    291.82    3. Alcohol dependence, daily use ([place]) 303.91 F10.20   4. Alcohol intoxication in alcoholism with blood level over 0.3 with complication (HCC) 644.03 F10.229   5. Hx of pancreatitis V12.79 Z87.19   6. GAD (generalized anxiety disorder) 300.02 F41.1   7. Major depressive disorder, recurrent episode, moderate (HCC) 296.32 F33.1    Diagnosis:   Patient Active Problem List   Diagnosis Date Noted  . Alcohol dependence with alcohol-induced sleep disorder ([place]) [F10.282] 02/03/2015  . Alcohol dependence, daily use ([place]) [F10.20] 02/03/2015  . Alcohol intoxication in alcoholism with blood level over 0.3 with complication ([place]) [K74.259] 02/03/2015  . Hx of pancreatitis [Z87.19] 01/19/2015  . Prolonged Q-T interval on ECG [I45.81] 01/19/2015  . Alcohol use disorder, severe, dependence ([place]) [F10.20] 08/23/2014  . GAD (generalized anxiety disorder) [F41.1] 08/14/2013  . Major depressive disorder, recurrent episode, moderate ([place]) [F33.1] 08/14/2013  . ASTHMA [J45.909] 07/31/2008   History of Present Illness: 35 yo WM presents with CC quoteed above and ongoing issues with sleep-"basically I  havent slept in 3 days" despite meds from discharge for same Ambien (not  permitted in CDIOP) Neurontin and Doxepin seeking entry into the CD-IOP program. Patient was referred from [facility] detox. Pt has had 3 admissions to [facility]  8/17 and 12/25/2014 and January 8,2017 related to  his alcoholism and it complications with his mood (suicidally depressed) and sleep (6 days of insomnia with psychosis from lack of sleep.  Pt was released 12/21/14 and came in for orientation on 01/15/15. Pt was to begin CD-IOP on Monday 1/9 but Saturday 1/7 pt presented at [facility] for auditory and visual hallucinations, and severe anxiety, but no alcohol use. He was admitted to [facility] and was released Thursday 1/12 in time to walk downstairs for CD-IOP.On January 23 the patient was discharged from the CD-IOP program today. He has missed 3 consecutive group sessions and has not responded to our repeated phone calls and voice mail messages. It seemed likely he is not interested in pursuing additional treatment to address his chemical dependency. He was referred by the staff upstairs while he was in detox in [facility]. The patient only attended one group session and never returned. Discharged unsuccessful.Today the patient called apologizing for his slip and requesting treatment.  Pt began drinking at the age of 46. He experimented with cocaine and marijuana in college but no use since that time. Pt reports he drinks a fifth of vodka 5-6 times per week. He lives alone in [place] but has visitation rights with his70 yo son. Due to previous allegations of alcohol abuse around his son pt carries a breathalyzer that he must blow into every 6 hours, one day prior to seeing son and during visitation with son. The results of the breathalyzer are sent to a monitoring agency. The pt had a DWI in2007 and 2013. His son was in the car during the 2013 DWI arrest, hence the breathalyzer. Pt went inpatient at [facility] August 2011, then completed IOP  and sober housing. He stayed sober 2-3 months afterwards. He went to [facility] in [place] October 2013 and was sober for 14 months. He has been to [facility] approximately 6 times. The pt has an irregular employment history but had a job at [employer] but lost it due to not showing up  for work. Pt went to [employer]. His girlfriend got pregnant. 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 He and the baby's mother have been in/out of family court, each trying to gain custody.                                                                                                                                                                       Associated Signs/Symptoms: Patient Identification: Ian [name] Date of Admission:  01/18/2015 Date of Discharge: 01/21/2015 MRN:  [id number] Date of Evaluation:  01/19/2015 Chief Complaint:"  I was going through a lot. I relapsed briefly , but I drank a lot during that time.' Principal Diagnosis: Major depressive disorder, recurrent episode, moderate (HCC) Diagnosis:   Patient Active Problem List     Diagnosis  Date Noted   .  Alcohol use disorder, severe, dependence (HCC) [F10.20]  08/23/2014   .  Suicidal ideation [R45.851]  08/23/2014   .  Pancreatitis [K85.9]  01/30/2014   .  Acute pancreatitis [K85.90]  01/30/2014   .  Abdominal pain [R10.9]  01/30/2014   .  Nausea and vomiting [R11.2]  01/30/2014   .  Hyponatremia [E87.1]  01/30/2014   .  Leukocytosis [D72.829]     .  Nicotine abuse [F19.10]  08/14/2013   .  GAD (generalized anxiety disorder) [F41.1]  08/14/2013   .  Major depressive disorder, recurrent episode, moderate (HCC) [F33.1]  08/14/2013   .  ABDOMINAL PAIN OTHER SPECIFIED SITE [R10.9]  08/05/2009   .  CHEST PAIN [R07.9]  01/19/2009   .  CERUMEN IMPACTION [H61.20]  07/31/2008   .  ASTHMA [J45.909]  07/31/2008   History of Present Illness:: Ian [name] is a 35 y.o. Caucasian male, single , unemployed , lives by self in [place] , who has a hx of anxiety, depression and alcohol abuse , presented to [facility] after he relapsed on alcohol and started having hallucinations.  Results for GIOVANY, COSBY (MRN [id number]) as of 02/03/2015 17:09  Ref. 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   Family Relationships: Strained at best due to pt's drinking Financial / Lack of resources (include bankruptcy): limited income.pays  out of pocket for [employer]. Housing / Lack of housing:: lives in apt in [place] with roommate.   Physical health (include injuries & life threatening diseases): HTN, Anxiety and Depression Social relationships: Isolates Substance abuse: relapse 1 gallon of liquor over a two day period last week Bereavement / Loss: n/a  DSM 5 + 11/11 criteria  Severe dependence  AUDIT score 29 Consumption score 11/12 Dependenct y score 9/12 Alcohol related problem score 4/8 + = almost certainlly dependent  CAGE + 4/4  Depression Symptoms:  depressed mood, anhedonia, insomnia, psychomotor retardation, fatigue, feelings of worthlessness/guilt, difficulty concentrating, suicidal thoughts without plan, disturbed sleep, decreased appetite,  PHQ 9 score 19 Moderately severe depression (Hypo) Manic Symptoms:  denies Anxiety Symptoms:  Excessive Worry,"about everything;everyday things" Panic Symptoms,? Past few nights startle not sleeping Obsessive Compulsive Symptoms:   Checking,, Psychotic Symptoms:  Delusions, Hallucinations: Auditory during 6 day cycle of insomnia  Ideas of Reference,same Paranoia,same PTSD Symptoms: Negative denies  Past Medical History:  Past Medical History  Diagnosis Date  . Anxiety   . Depression   . Hypertension   . Asthma   . Alcohol dependence (HCC)   . Pancreatitis     Past Surgical History  Procedure Laterality Date  . No past surgeries     Family History:  Family History  Problem Relation Age of Onset  . OCD Mother   . Heart disease Mother   . Suicidality Neg Hx    Social History:   Social History   Social  History  . Marital Status: Single    Spouse Name: N/A  . Number of Children: N/A  . Years of Education: N/A   Social History Main Topics  . Smoking status: Former Smoker    Types: Cigarettes    Quit date: 01/31/1999  . Smokeless tobacco: Never Used  . Alcohol Use: Yes     Comment: varies how much  . Drug Use: No  . Sexual Activity: Yes    Birth Control/ Protection: Condom   Other Topics Concern  . Not on file   Social History Narrative   Additional Social History: Living/Environment/Situation:  Living Arrangements: roommate. Apt in [place] Living conditions (as described by patient or guardian): fair; lease expires at end of Aug-may renew.   How long has patient lived in current situation?: 11 months What is atmosphere in current home: Other (Comment) safe; clean.  Childhood History:   By whom was/is the patient raised?: Both parents Additional childhood history information: Parents divorced at pt's age 59 Description of patient's relationship with caregiver when they were a child: "fine" Patient's description of current relationship with people who raised him/her: Srained with mother, father and step father Does patient have siblings?: Yes Number of Siblings: 1   Description of patient's current relationship with siblings: "Not great with older sister" Did patient suffer any verbal/emotional/physical/sexual abuse as a child?: No Did patient suffer from severe childhood neglect?: No Has patient ever been sexually abused/assaulted/raped as an adolescent or adult?: No Was the patient ever a victim of a crime or a disaster?: No Witnessed domestic violence?: No Has patient been effected by domestic violence as an adult?: No Education:   Highest grade of school patient has completed: 16 Currently a student?: No Contact person: [name]-STEP FATHER [phone] Learning disability?: No Employment/Work Situation:   Employment situation: has been working for  the past week as a Surveyor, minerals.   Patient's job has been impacted by current illness: Yes-missing work due  to being hospitalized.   What is the longest time patient has a held a job?: 2 1/2 years   Where was the patient employed at that time?: Health and safety inspector at [employer].   Has patient ever been in the [employer]?: No Has patient ever served in combat?: No   Musculoskeletal: Strength & Muscle Tone: within normal limits Gait & Station: normal Patient leans: N/A  Psychiatric Specialty Exam: HPI  Review of Systems  Constitutional: Negative for fever, chills, weight loss, malaise/fatigue and diaphoresis.  Eyes: Negative for blurred vision, double vision, photophobia, pain, discharge and redness.  Respiratory: Negative for cough, hemoptysis, sputum production, shortness of breath and wheezing.   Cardiovascular: Negative for chest pain, palpitations, orthopnea, claudication and leg swelling.  Gastrointestinal: Negative for heartburn, nausea, vomiting, abdominal pain, diarrhea, constipation, blood in stool and melena.  Neurological: Negative for dizziness, tingling, tremors, sensory change, speech change, focal weakness, seizures, loss of consciousness and weakness.  Psychiatric/Behavioral: Positive for depression, memory loss and substance abuse. Negative for suicidal ideas and hallucinations. The patient is nervous/anxious and has insomnia.   All other systems reviewed and are negative.   There were no vitals taken for this visit.There is no weight on file to calculate BMI.  General Appearance: Fairly Groomed and Pale;fatigued  Eye Contact:  Fair  Speech:  Clear and Coherent  Volume:  Normal  Mood:  Dysphoric  Affect:  Congruent  Thought Process:  Circumstantial and Coherent  Orientation:  Full (Time, Place, and Person)  Thought Content:  WDL and Rumination  Suicidal Thoughts:  No  Homicidal Thoughts:  No  Memory:  Negative  Judgement:  Impaired  Insight:  Lacking  Psychomotor  Activity:  Negative  Concentration:  Negative  Recall:  Good  Fund of Knowledge:Fair  Language: Fair  Akathisia:  NA  Handed:  Right  AIMS (if indicated):  NA  Assets:  Desire for Improvement Financial Resources/Insurance Housing Resilience Social Support Transportation  ADL's:  Intact  Cognition: Impaired,  Moderate alcohol/sleep related  Sleep:  Insomniac   Is the patient at risk to self?  No. Has the patient been a risk to self in the past 6 months?  Yes.   Has the patient been a risk to self within the distant past?  No. Is the patient a risk to others?  No. Has the patient been a risk to others in the past 6 months?  No. Has the patient been a risk to others within the distant past?  No.  Allergies:  No Known Allergies Current Medications: Current Outpatient Prescriptions  Medication Sig Dispense Refill  . albuterol (PROAIR HFA) 108 (90 Base) MCG/ACT inhaler Inhale into the lungs.    Marland Kitchen acamprosate (CAMPRAL) 333 MG tablet Take 2 tablets (666 mg total) by mouth 3 (three) times daily with meals. 180 tablet 0  . FLUoxetine (PROZAC) 20 MG capsule TAKE 1 CAPSULE BY MOUTH IN THE MORNING FOR MOOD  1  . hydrOXYzine (ATARAX/VISTARIL) 50 MG tablet Take 50 mg by mouth every 6 (six) hours as needed.  1  . lisinopril (PRINIVIL,ZESTRIL) 5 MG tablet Take 5 mg by mouth daily.  1  . mometasone-formoterol (DULERA) 100-5 MCG/ACT AERO Inhale 2 puffs into the lungs 2 (two) times daily. 1 Inhaler 0  . naltrexone (DEPADE) 50 MG tablet Take 0.5 tablets (25 mg total) by mouth daily. 30 tablet 0  . QUEtiapine (SEROQUEL) 25 MG tablet Take 1-2 tablets at nbedtime 60 tablet 2  . traZODone (DESYREL) 100 MG tablet TAKE  1 to 2 TABLETS BY MOUTH AT BEDTIME FOR INSOMNIA  1   No current facility-administered medications for this visit.   Labs: Results for DAREL, RICKETTS (MRN [id number]) as of 02/03/2015 17:09  Ref. Range 12/22/2014 21:48 12/24/2014 06:35 01/17/2015 07:56  Sodium Latest Ref Range:  135-145 mmol/L 145  139  Potassium Latest Ref Range: 3.5-5.1 mmol/L 3.6  3.6  Chloride Latest Ref Range: 101-111 mmol/L 109  106  CO2 Latest Ref Range: 22-32 mmol/L 26  24  Mean Plasma Glucose Latest Units: mg/dL  123   BUN Latest Ref Range: 6-20 mg/dL 9  11  Creatinine Latest Ref Range: 0.61-1.24 mg/dL 0.87  0.86  Calcium Latest Ref Range: 8.9-10.3 mg/dL 8.8 (L)  9.0  EGFR (Non-African Amer.) Latest Ref Range: >60 mL/min >60  >60  EGFR (African American) Latest Ref Range: >60 mL/min >60  >60  Glucose Latest Ref Range: 65-99 mg/dL 145 (H)  107 (H)  Anion gap Latest Ref Range: 5-'15  10  9  '$ Alkaline Phosphatase Latest Ref Range: 38-126 U/L 82  72  Albumin Latest Ref Range: 3.5-5.0 g/dL 3.6  4.3  Lipase Latest Ref Range: 11-51 U/L  129 (H)   AST Latest Ref Range: 15-41 U/L 130 (H)  33  ALT Latest Ref Range: 17-63 U/L 253 (H)  42  Total Protein Latest Ref Range: 6.5-8.1 g/dL 6.6  7.6  Total Bilirubin Latest Ref Range: 0.3-1.2 mg/dL 0.8  1.2  WBC Latest Ref Range: 4.0-10.5 K/uL 6.4  8.3  RBC Latest Ref Range: 4.22-5.81 MIL/uL 4.27  4.21 (L)  Hemoglobin Latest Ref Range: 13.0-17.0 g/dL 13.8  13.6  HCT Latest Ref Range: 39.0-52.0 % 40.6  38.7 (L)  MCV Latest Ref Range: 78.0-100.0 fL 95.1  91.9  MCH Latest Ref Range: 26.0-34.0 pg 32.3  32.3  MCHC Latest Ref Range: 30.0-36.0 g/dL 34.0  35.1  RDW Latest Ref Range: 11.5-15.5 % 16.1 (H)  14.0  Platelets Latest Ref Range: 150-400 K/uL 173  139 (L)  Neutrophils Latest Units: %   46  Lymphocytes Latest Units: %   38  Monocytes Relative Latest Units: %   8  Eosinophil Latest Units: %   7  Basophil Latest Units: %   1  NEUT# Latest Ref Range: 1.7-7.7 K/uL   3.9  Lymphocyte # Latest Ref Range: 0.7-4.0 K/uL   3.1  Monocyte # Latest Ref Range: 0.1-1.0 K/uL   0.6  Eosinophils Absolute Latest Ref Range: 0.0-0.7 K/uL   0.6  Basophils Absolute Latest Ref Range: 8.5-9.2 K/uL   0.1  Salicylate Lvl Latest Ref Range: 2.8-30.0 mg/dL <4.0    Acetaminophen  Latest Ref Range: 10-30 ug/mL <10 (L)    Hemoglobin A1C Latest Ref Range: 4.8-5.6 %  5.9 (H)    Previous Psychotropic Medications: Yes Trazodone;benzodiazepenes.  Substance Abuse History in the last 12 months:  Substance Age of 1st Use Last Use Amount Specific Type  Nicotine 16 2001    Alcohol 16 1/20 ? 1/5 Vodka  Cannabis 19 2003 smoked MJ in college  Opiates na     Cocaine na     Methamphetamines na     LSD na     Ecstasy na     Benzodiazepines na     Caffeine na     Inhalants na     Others: na                          Consequences of Substance  Abuse: Medical Consequences:  Pancreatitis;hepatitis;insomnia with psychosis Legal Consequences:  DUI 2013;2007 Family Consequences:  Divorced and in custody battle-must use breathalyzer when with son Blackouts:   DT's: Withdrawal Symptoms:   Nausea Tremors insomnia with psychosis last episode  Medical Decision Making:  Review of Psycho-Social Stressors (1), Review and summation of old records (2), Established Problem, Worsening (2) and Review or order medicine tests (1)  Treatment Plan Summary: Treatment Plan/Recommendations:  Plan of Care: [place] [facility] IOP  Laboratory:  UDS per protocol  Psychotherapy: Individual and group  Medications: see list Rx Seroquel for mood and sleep D/C Ambien Neurontin and Doxepin  Routine PRN Medications:  Negative  Consultations: None now-consider sleep study later  Safety Concerns: Relapse/uncontrolled insomnia  Other:  NA      Darlyne Russian 1/25/20175:15 PM  02/04/2015 Received call from [place] at CD IOP in [place] in ED last c/o palpitations after taking Seroquel Labs EKG O2 ALL NORMAL. Pt has anxies ety neurosis and this appears to be a situational anxiety reaction.Will try rx propranolol 30 minutes prior to HS meds. FU Monday

## 2015-02-03 NOTE — ED Notes (Signed)
Pt states that he started taking seroquel tonight and laid down and started having palpitations and tachycardia. Alert and oriented.

## 2015-02-04 ENCOUNTER — Other Ambulatory Visit (HOSPITAL_COMMUNITY): Payer: BLUE CROSS/BLUE SHIELD | Admitting: Psychology

## 2015-02-04 ENCOUNTER — Other Ambulatory Visit (HOSPITAL_COMMUNITY): Payer: BLUE CROSS/BLUE SHIELD

## 2015-02-04 ENCOUNTER — Encounter (HOSPITAL_COMMUNITY): Payer: Self-pay | Admitting: Emergency Medicine

## 2015-02-04 DIAGNOSIS — F411 Generalized anxiety disorder: Secondary | ICD-10-CM

## 2015-02-04 DIAGNOSIS — F1020 Alcohol dependence, uncomplicated: Secondary | ICD-10-CM | POA: Diagnosis not present

## 2015-02-04 DIAGNOSIS — F331 Major depressive disorder, recurrent, moderate: Secondary | ICD-10-CM

## 2015-02-04 DIAGNOSIS — Z8719 Personal history of other diseases of the digestive system: Secondary | ICD-10-CM

## 2015-02-04 MED ORDER — PROPRANOLOL HCL 20 MG PO TABS: ORAL_TABLET | ORAL | Status: DC

## 2015-02-04 NOTE — ED Provider Notes (Signed)
CSN: [id number]     Arrival date & time 02/03/15  2307 History  By signing my name below, I, Ian [name], attest that this documentation has been prepared under the direction and in the presence of Ian Behrle, MD . Electronically Signed: Freida [name], Scribe. 02/04/2015. 2:27 AM.    Chief Complaint  Patient presents with  . Tachycardia  . Medication Reaction   Patient is a 35 y.o. male presenting with palpitations. The history is provided by the patient. No language interpreter was used.  Palpitations Palpitations quality:  Fast Timing:  Constant Progression:  Resolved Chronicity:  New Context comment:  Seroquel Worsened by:  Nothing Ineffective treatments:  None tried Associated symptoms: no diaphoresis, no dizziness, no nausea, no vomiting and no weakness      HPI Comments:  Ian [name] is a 35 y.o. male who presents to the Emergency Department complaining of palpitations which began shortly after taking Seroquel last night; notes the episode lasted ~ 2 hrs. He described symptom as his heart racing.  Pt states he recently started taking the Seroquel due to insomina. He notes symptom has resolved at this time. He denies swelling in his LE, recent long periods of immobilization or recent surgery. No alleviating factors noted. Pt has no other complaints or symptoms at this time.    Past Medical History  Diagnosis Date  . Anxiety   . Depression   . Hypertension   . Asthma   . Alcohol dependence (HCC)   . Pancreatitis    Past Surgical History  Procedure Laterality Date  . No past surgeries     Family History  Problem Relation Age of Onset  . OCD Mother   . Heart disease Mother   . Suicidality Neg Hx    Social History  Substance Use Topics  . Smoking status: Former Smoker    Types: Cigarettes    Quit date: 01/31/1999  . Smokeless tobacco: Never Used  . Alcohol Use: Yes     Comment: varies how much    Review of Systems  Constitutional: Negative for  diaphoresis.  Cardiovascular: Positive for palpitations.  Gastrointestinal: Negative for nausea and vomiting.  Neurological: Negative for dizziness and weakness.  All other systems reviewed and are negative.   Allergies  Review of patient's allergies indicates no known allergies.  Home Medications   Prior to Admission medications   Medication Sig Start Date End Date Taking? Authorizing Provider  acamprosate (CAMPRAL) 333 MG tablet Take 2 tablets (666 mg total) by mouth 3 (three) times daily with meals. 01/21/15  Yes Ian Brook, NP  albuterol (PROAIR HFA) 108 (90 Base) MCG/ACT inhaler Inhale 1-2 puffs into the lungs every 4 (four) hours as needed for wheezing.  01/04/15  Yes Historical Provider, MD  hydrOXYzine (ATARAX/VISTARIL) 50 MG tablet Take 50 mg by mouth every 6 (six) hours as needed for anxiety.  01/14/15  Yes Historical Provider, MD  lisinopril (PRINIVIL,ZESTRIL) 5 MG tablet Take 5 mg by mouth daily. 01/14/15  Yes Historical Provider, MD  mometasone-formoterol (DULERA) 100-5 MCG/ACT AERO Inhale 2 puffs into the lungs 2 (two) times daily. 01/21/15  Yes Ian Brook, NP  naltrexone (DEPADE) 50 MG tablet Take 0.5 tablets (25 mg total) by mouth daily. 01/21/15  Yes Ian Brook, NP  QUEtiapine (SEROQUEL) 25 MG tablet Take 1-2 tablets at nbedtime Patient taking differently: Take 25-50 mg by mouth at bedtime.  02/03/15  Yes Ian Joy, PA-C  traZODone (DESYREL) 100 MG tablet TAKE 1 to 2  TABLETS BY MOUTH AT BEDTIME FOR INSOMNIA 01/14/15  Yes Historical Provider, MD   BP 138/112 mmHg  Pulse 89  Temp(Src) 97.9 F (36.6 C) (Oral)  Resp 16  SpO2 97% Physical Exam  Constitutional: He is oriented to person, place, and time. He appears well-developed and well-nourished. No distress.  HENT:  Head: Normocephalic and atraumatic.  Mouth/Throat: Oropharynx is clear and moist. No oropharyngeal exudate.  Moist mucous membranes   Eyes: Conjunctivae are normal. Pupils are equal, round, and  reactive to light.  Neck: Normal range of motion. Neck supple. No JVD present.  Trachea midline  Cardiovascular: Normal rate, regular rhythm and normal heart sounds.   Pulmonary/Chest: Effort normal and breath sounds normal. No respiratory distress. He has no wheezes. He has no rales.  Abdominal: Soft. Bowel sounds are normal. He exhibits no distension. There is no tenderness. There is no rebound.  Musculoskeletal: Normal range of motion. He exhibits no edema or tenderness.       Right lower leg: Normal.       Left lower leg: Normal.  Compartments soft,  Negative homans sign  Neurological: He is alert and oriented to person, place, and time. He has normal reflexes.  Skin: Skin is warm and dry.  Psychiatric: He has a normal mood and affect. His behavior is normal.  Nursing note and vitals reviewed.   ED Course  Procedures   DIAGNOSTIC STUDIES:  Oxygen Saturation is 98% on RA, normal by my interpretation.    COORDINATION OF CARE:  2:19 AM Discussed treatment plan with pt at bedside and pt agreed to plan.  Labs Review Labs Reviewed  BASIC METABOLIC PANEL - Abnormal; Notable for the following:    Sodium 134 (*)    CO2 20 (*)    Glucose, Bld 164 (*)    All other components within normal limits  CBC  I-STAT TROPOININ, ED    Imaging Review No results found. I have personally reviewed and evaluated these images and lab results as part of my medical decision-making.   EKG Interpretation   Date/Time:  Wednesday February 03 2015 23:12:23 EST Ventricular Rate:  113 PR Interval:  155 QRS Duration: 114 QT Interval:  370 QTC Calculation: 507 R Axis:   28 Text Interpretation:  Sinus tachycardia Confirmed by [facility]  MD,  Ian [name] ([id number]) on 02/04/2015 2:25:21 AM      MDM   Final diagnoses:  Medication reaction, initial encounter    Pt is PERC negative, and [place] 0. Highly doubt PE. Symptoms consistent with medication reaction as he did not have the symptom before  taking Seroquel. Palpitations resolved without intervention in the ED. Pt is stable for discharge; advised pt to follow up with the psychiatrist that prescribed the Seroquel. Strict return precautions given.  Patient will call his doctor in the am  I personally performed the services described in this documentation, which was scribed in my presence. The recorded information has been reviewed and is accurate.      Cy Blamer, MD 02/04/15 [phone]

## 2015-02-04 NOTE — ED Notes (Signed)
MD at bedside. 

## 2015-02-05 ENCOUNTER — Encounter (HOSPITAL_COMMUNITY): Payer: Self-pay | Admitting: Psychology

## 2015-02-05 ENCOUNTER — Other Ambulatory Visit (HOSPITAL_COMMUNITY): Payer: BLUE CROSS/BLUE SHIELD

## 2015-02-05 NOTE — Progress Notes (Signed)
    Daily Group Progress Note  Program: CD-IOP   Group Time: 1-2:30 pm  Participation Level: Active  Behavioral Response: Appropriate and Sharing  Type of Therapy: Process Group  Topic: CD-IOP Group Process Session- Patients met with counselors for group process session. Patients checked in about their recovery process since the last group and shared about challenges, triggers, and recovery-related behaviors. Counselor used a mixture of open questions, reflection, Mindfulness CBT, and linking to help clients understand each other's experience and foster open sharing in the group.  Group Time: 2:45- 4pm  Participation Level: Active  Behavioral Response: Appropriate and Sharing  Type of Therapy: Psycho-education Group  Topic: CD-IOP Group Psycho-Education Session: Patients viewed a TED talk from [employer] on the power of vulnerability. Counselors then taught about vulnerability in recovery, healthy boundaries, and how to have courage in the midst of struggle. Patients compared their experience of vulnerability outside of group with the experience of vulnerability in the group.   Summary: Patient presented as open with reddened eyes, and somewhat tired since he revealed that he was unable to sleep last night due to his new medicine, Seroquel. He reports that after taking the Seroquel, his heart rate rose to a resting 120bpm and he eventually drove himself to the hospital. They monitored his condition and allowed him to eventually sleep a few hours before returning home early this morning. He reports that he was unable to get significant sleep but that he went to a new AA meeting this morning that he enjoyed since there were more young people in attendance. Despite feeling exhausted, patient was engaged in group. Counselor informed patient that she would Development worker, international aid to make medication recommendations. Patient is anticipating a good weekend with his son and will attend AA meetings  throughout. Patient's sobriety date is 1/23.   Family Program: Family present? No   Name of family member(s):   UDS collected: No Results:  AA/NA attended?: YesTuesday and Wednesday  Sponsor?: No   Sami Roes, LCAS

## 2015-02-05 NOTE — Progress Notes (Signed)
    Daily Group Progress Note  Program: CD-IOP   Group Time: 1-2:30 pm  Participation Level: Active  Behavioral Response: Sharing  Type of Therapy: Process Group  Topic: Process: The first half of group was spent in process. Members shared about their lives in early recovery. They were asked to identify what they have done since we last met to remain drug-free. Group members were asked to share about any challenges or difficulties that they may have experienced and what they did to negate them. During group, today, the Investment banker, operational met with a new member and completed a med-check update with another.  Group Time: 2:45-4 pm  Participation Level: Minimal  Behavioral Response: Appropriate  Type of Therapy: Psycho-education Group  Topic: Psycho-Ed: the second half of group was spent in psycho-ed with a visiting chaplain. The topic for the session today was "vulnerability". The discussion included identifying what vulnerability means and followed with their fears about being vulnerable. Members were invited to engage with the chaplain and one member agreed to join him, "but you have to lead me". There was disclosure that some found surprising and admitted they might never have left themselves so open. It proved to be an intense session for all present today.   Summary: The patient appeared in group today for the first time after missing over a week of sessions. He admitted that he 'fell off the wagon", but reported he had stopped drinking and attended an [place] meeting last night and today at 8 am. The patient admitted he had gotten stressed out and described his manner of handling problems as 'irresponsible'. The group welcomed him back and seemed to be reassured. When asked about what he might have done to avoid falling off the wagon, the patient admitted he had not told anyone he was struggling and "I didn't reach out". In the second half of group, the patient identified how he used  'isolation' as a boundary to avoid letting others know him. He explained how he would text his former sponsor to avoid having to speak with him. Group members admitted that texting is a way of distancing one's self from another. The patient was called out for his session with the program director and missed most of the remainder of this session. He return was well-received, though, and he displayed good motivation to remain in this program and address his daily recovery needs with his counselor. The patient's sobriety date is 1/23.   Family Program: Family present? No   Name of family member(s):   UDS collected: Yes Results: pending  AA/NA attended?: YesTuesday and Wednesday  Sponsor?: No   Nassir Neidert, LCAS

## 2015-02-07 ENCOUNTER — Encounter (HOSPITAL_COMMUNITY): Payer: Self-pay | Admitting: Psychology

## 2015-02-08 ENCOUNTER — Other Ambulatory Visit (HOSPITAL_COMMUNITY): Payer: BLUE CROSS/BLUE SHIELD | Admitting: Psychology

## 2015-02-08 ENCOUNTER — Other Ambulatory Visit (HOSPITAL_COMMUNITY): Payer: BLUE CROSS/BLUE SHIELD

## 2015-02-08 VITALS — BP 122/78 | HR 83 | Ht 72.0 in | Wt 209.6 lb

## 2015-02-08 DIAGNOSIS — F10282 Alcohol dependence with alcohol-induced sleep disorder: Secondary | ICD-10-CM

## 2015-02-08 DIAGNOSIS — F1020 Alcohol dependence, uncomplicated: Secondary | ICD-10-CM

## 2015-02-08 DIAGNOSIS — F1994 Other psychoactive substance use, unspecified with psychoactive substance-induced mood disorder: Secondary | ICD-10-CM

## 2015-02-08 DIAGNOSIS — Z8719 Personal history of other diseases of the digestive system: Secondary | ICD-10-CM

## 2015-02-08 DIAGNOSIS — K860 Alcohol-induced chronic pancreatitis: Secondary | ICD-10-CM

## 2015-02-08 DIAGNOSIS — F411 Generalized anxiety disorder: Secondary | ICD-10-CM

## 2015-02-08 DIAGNOSIS — K852 Alcohol induced acute pancreatitis without necrosis or infection: Secondary | ICD-10-CM | POA: Insufficient documentation

## 2015-02-08 NOTE — Progress Notes (Signed)
BH MD/PA/NP OP Progress Note  02/08/2015 2:46 PM YI HAUGAN  MRN:  [id number]  Subjective:  FU for medications and sleep per Counselor. Pt states "I'm sleeping now .I just have trouble falling asleep.I dont know why but  I DONT LIKE GOING TO SLEEP" Chief Complaint:  Chief Complaint    Follow-up; Alcohol Problem; Anxiety     Visit Diagnosis:     ICD-9-CM ICD-10-CM   1. Substance induced mood disorder (HCC) 292.84 F19.94   2. 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 Alcohol use disorder, severe, dependence (HCC) 303.90 F10.20   4. 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 Anxiety   . Depression   . Hypertension   . Asthma   . Alcohol dependence (HCC)   . Pancreatitis     Past Surgical History  Procedure Laterality Date  . No past surgeries     Family History:  Family History  Problem Relation Age of Onset  . OCD Mother   . Heart disease Mother   . Suicidality Neg Hx    Social History:  Social History   Social History  . Marital Status: Single    Spouse Name: N/A  . Number of Children: N/A  . Years of Education: N/A   Social History Main Topics  . Smoking status: Former Smoker    Types: Cigarettes    Quit date: 01/31/1999  . Smokeless tobacco: Never Used  . Alcohol Use: 36.0 oz/week    60 Shots of liquor per week     Comment: varies how much  . Drug Use: No  . Sexual Activity: Yes    Birth Control/ Protection: Condom   Other Topics Concern  . Not on file   Social History Narrative   Additional History: 1. CSN: [id number]     Arrival date & time 02/03/15  2307 History   By signing my name below, I, [name], attest that this documentation has been prepared under the direction and in the presence of April Palumbo, MD . Electronically Signed: [name], Scribe. 02/04/2015. 2:27 AM. Chief Complaint   Patient presents with   .   Tachycardia   .  Medication Reaction    Patient is a 35 y.o. male presenting with palpitations. The history is provided by the patient. No language interpreter was used.  Palpitations Palpitations quality:  Fast Timing:  Constant Progression:  Resolved Chronicity:  New Context comment:  Seroquel Worsened by:  Nothing Ineffective treatments:  None tried Associated symptoms: no diaphoresis, no dizziness, no nausea, no vomiting and no weakness   HPI Comments: MACE WEINBERG is a 35 y.o. male who presents to the Emergency Department complaining of palpitations which began shortly after taking Seroquel last night; notes the episode lasted ~ 2 hrs. He described symptom as his heart racing.  Pt states he recently started taking the Seroquel due to insomina. He notes symptom has resolved at this time. He denies swelling in his LE, recent long periods of immobilization or recent surgery. No alleviating factors noted. Pt has no other complaints or symptoms at this tim  2. C/O increased abdominal girth after hx of 20 lb wgt loss (fluid) in hospital 2017 Hx of alcoholic hepatitis-No recent CT EXAM:Last exam 2016:  CT ABDOMEN AND PELVIS WITH CONTRAST TECHNIQUE: Multidetector CT imaging of the abdomen and pelvis was performed using the standard  protocol following bolus administration of intravenous contrast. CONTRAST:  OMNIPAQUE IOHEXOL 300 MG/ML  SOLN COMPARISON:  Prior abdominal ultrasound 08/06/2009 FINDINGS: Lower Chest: The lung bases are clear. Visualized cardiac structures are within normal limits for size. No pericardial effusion. Unremarkable visualized distal thoracic esophagus.   Abdomen: Unremarkable CT appearance of the stomach, spleen and adrenal glands. The pancreas appears edematous, particularly in the pancreatic head and uncinate process, and also at the junction of the pancreatic body and tail. There is extensive inflammatory stranding throughout the peripancreatic  fat as well as peripancreatic fluid which tracks up into the left subdiaphragmatic space and down the the bilateral pericolic gutters. Edema extends into the gastrosplenic ligament and root of the mesentery. No discrete fluid collection to suggest pseudocyst or abscess.   Normal hepatic contour and morphology. Gallbladder is unremarkable. No intra or extrahepatic biliary ductal dilatation. Unremarkable appearance of the bilateral kidneys. No focal solid lesion, hydronephrosis or nephrolithiasis.  Mild circumferential thickening of the descending duodenum is likely reactive. No evidence of bowel obstruction. Normal appendix in the right hemi abdomen. No evidence of free air.  Pelvis: Unremarkable bladder, prostate gland and seminal vesicles. No free fluid or suspicious adenopathy. Bones/Soft Tissues: No acute fracture or aggressive appearing lytic or blastic osseous lesion. Vascular: Normal patency of the splenic and portal veins. No evidence of significant atherosclerotic plaque or aneurysm. Incidental note is made of a circumaortic left renal vein.  IMPRESSION: 1. CT findings are most consistent with acute uncomplicated pancreatitis. There is a moderate amount of peripancreatic stranding and fluid extending into the root of the mesentery, the porta hepatis, the left greater than right pericolic gutters and the gastrosplenic ligament. No evidence of pancreatic necrosis, pseudocyst or vascular complication. Electronically Signed   By: Malachy Moan M.D.   On: 01/30/2014 17:13  Assessment: Just starting CD IOP .Stabilized with sleep now on propranolol and 50 mg seroquel;suspect increased girth is fat based on hx of 20 lb wgt loss with treatment consistent with fluid.Hypertension hx consistent with alcohol.Now using propranolol for anxiety.   Musculoskeletal: Strength & Muscle Tone: within normal limits Gait & Station: normal Patient leans: N/A  Psychiatric Specialty Exam: HPI  Pt seen for FU on medications per Subjective and Additional Hx.   Review of Systems  Constitutional: Positive for weight loss. Negative for fever, chills, malaise/fatigue and diaphoresis.  Cardiovascular: Positive for palpitations (anxiety related). Negative for chest pain, orthopnea, claudication, leg swelling and PND.  Gastrointestinal: Negative for heartburn, nausea, vomiting, abdominal pain, diarrhea, constipation, blood in stool and melena.       C/o ^ girth  Neurological: Negative for dizziness, tingling, tremors, sensory change, speech change, focal weakness, seizures, loss of consciousness and weakness.  Psychiatric/Behavioral: Positive for depression and substance abuse. Negative for suicidal ideas, hallucinations and memory loss. The patient is nervous/anxious and has insomnia.     Blood pressure 122/78, pulse 83, height 6' (1.829 m), weight 209 lb 9.6 oz (95.074 kg).Body mass index is 28.42 kg/(m^2).  General Appearance: Well Groomed  Eye Contact:  Good  Speech:  Clear and Coherent  Volume:  Normal  Mood:  Euthymic  Affect:  Congruent  Thought Process:  Coherent  Orientation:  Full (Time, Place, and Person)  Thought Content:  WDL  Suicidal Thoughts:  No  Homicidal Thoughts:  No  Memory:  Negative  Judgement:  Fair  Insight:  Lacking  Psychomotor Activity:  Normal  Concentration:  Good  Recall:  Good  Fund of Knowledge: Fair  Language: Fair  Akathisia:  NA  Handed:  Right  AIMS (if indicated):  NA  Assets:  Desire for Improvement Housing Resilience Social Support Transportation  ADL's:  Intact  Cognition: WNL  Sleep:  Improved   Is the patient at risk to self?  No. Has the patient been a risk to self in the past 6 months?  Yes.   Has the patient been a risk to self within the distant past?  Yes.   Is the patient a risk to others?  No. Has the patient been a risk to others in the past 6 months?  No. Has the patient been a risk to others within the distant past?   No.  Current Medications: Current Outpatient Prescriptions  Medication Sig Dispense Refill  . acamprosate (CAMPRAL) 333 MG tablet Take 2 tablets (666 mg total) by mouth 3 (three) times daily with meals. 180 tablet 0  . albuterol (PROAIR HFA) 108 (90 Base) MCG/ACT inhaler Inhale 1-2 puffs into the lungs every 4 (four) hours as needed for wheezing.     . hydrOXYzine (ATARAX/VISTARIL) 50 MG tablet Take 50 mg by mouth every 6 (six) hours as needed for anxiety.   1  . lisinopril (PRINIVIL,ZESTRIL) 5 MG tablet Take 5 mg by mouth daily.  1  . mometasone-formoterol (DULERA) 100-5 MCG/ACT AERO Inhale 2 puffs into the lungs 2 (two) times daily. 1 Inhaler 0  . naltrexone (DEPADE) 50 MG tablet Take 0.5 tablets (25 mg total) by mouth daily. 30 tablet 0  . propranolol (INDERAL) 20 MG tablet Take 1 tablet 1/2 hour before bedtime meds 30 tablet 2  . QUEtiapine (SEROQUEL) 25 MG tablet Take 1-2 tablets at nbedtime (Patient taking differently: Take 25-50 mg by mouth at bedtime. ) 60 tablet 2  . traZODone (DESYREL) 100 MG tablet Reported on 02/07/2015  1   No current facility-administered medications for this visit.    Medical Decision Making:  Established Problem, Stable/Improving (1) and Review of Medication Regimen & Side Effects (2)  Treatment Plan Summary:Continue CDIOP.Continue current meds. Lisinopril rx to expire-continue Propranolol HS and 1 x daily-monitor BP GI FU on completion of CD IOP .FU PRN     Maryjean Morn 02/08/2015, 2:46 PM

## 2015-02-09 ENCOUNTER — Other Ambulatory Visit (HOSPITAL_COMMUNITY): Payer: BLUE CROSS/BLUE SHIELD

## 2015-02-10 ENCOUNTER — Other Ambulatory Visit (HOSPITAL_COMMUNITY): Payer: BLUE CROSS/BLUE SHIELD | Attending: Psychiatry | Admitting: Psychology

## 2015-02-10 ENCOUNTER — Encounter (HOSPITAL_COMMUNITY): Payer: Self-pay | Admitting: Psychology

## 2015-02-10 ENCOUNTER — Other Ambulatory Visit (HOSPITAL_COMMUNITY): Payer: BLUE CROSS/BLUE SHIELD

## 2015-02-10 DIAGNOSIS — F411 Generalized anxiety disorder: Secondary | ICD-10-CM | POA: Insufficient documentation

## 2015-02-10 DIAGNOSIS — F1020 Alcohol dependence, uncomplicated: Secondary | ICD-10-CM | POA: Diagnosis not present

## 2015-02-10 DIAGNOSIS — F331 Major depressive disorder, recurrent, moderate: Secondary | ICD-10-CM

## 2015-02-10 NOTE — Progress Notes (Signed)
    Daily Group Progress Note  Program: CD-IOP   Group Time: 1-2:30 pm  Participation Level: Active  Behavioral Response: Sharing  Type of Therapy: Process Group  Topic: Process: the first half of group was spent in process. Members shared about the past weekend and identified things they had done to support their sobriety. These include doing things that contribute or enhance good self-care. One member checked-in with a new sobriety date. She admitted she had felt as if she had let the group down and that was why she had failed to disclose a return to use. The patient admitted she had felt so guilty since she withheld her use. This thinking was processed by her fellow group members. During this session, the program director met with 3 members to review medications and any complications.   Group Time: 2:45-4pm  Participation Level: Active  Behavioral Response: Sharing  Type of Therapy: Psycho-education Group  Topic: Psycho-ed: "Building a healthy Self-Esteem". The second half of group was spent in a psycho-ed on self-esteem. Those in active addiction invariably feel badly about their actions. In early recovery, these feelings can often grow in intensity because of guilt and shame. A handout was provided and a discussion followed on how one begins to repair or, in some cases, build up a healthy self-esteem.    Summary: The patient reported he had attended 2 AA meetings over the weekend. He had spent most of his time at his parent's home with his 55 yo son. He has some custody rights, but they do not allow him to drive with his son in the car. Thus, the teen-age son visits his dad at his grandparent's home so they can drive them if they want to go somewhere. When asked about his relationship with his parents', the patient admitted, "it sucks". He described how they speak with him as being critical, but they are hoping it will motivate him. Instead, he admitted that it just shames him  further. The patient also admitted that he lives by himself in a 2-bedroom apartment and he is 'lonely'. The group brainstormed about how this fellow might spend his day since he isn't working right now. There were some good suggestions, including specific volunteer opportunities. When asked about sleep, the patient reported he slept better over the weekend, but it is still taking him a long time to fall asleep. Later in the session, the patient met with the program director to address his sleep issues. In the psycho-ed, the patient agreed that his self-talk is very negative and harsh and he doesn't applaud his successes with the same intensity as his mistakes. The patient will meet with his counselor for his weekly individual session to discuss how he might better approach his day and begin to build a more positive sense of self and confidence. He made some good comments and was very open about his struggles in group today. The patient's sobriety date remains 1/23.   Family Program: Family present? No   Name of family member(s):   UDS collected: No Results:   AA/NA attended?: [place] and Thursday  Sponsor?: No   Sumiya Mamaril, LCAS

## 2015-02-11 ENCOUNTER — Other Ambulatory Visit (HOSPITAL_COMMUNITY): Payer: BLUE CROSS/BLUE SHIELD

## 2015-02-11 ENCOUNTER — Encounter (HOSPITAL_COMMUNITY): Payer: Self-pay | Admitting: Psychology

## 2015-02-11 ENCOUNTER — Other Ambulatory Visit (HOSPITAL_COMMUNITY): Payer: BLUE CROSS/BLUE SHIELD | Admitting: Psychology

## 2015-02-11 DIAGNOSIS — F1020 Alcohol dependence, uncomplicated: Secondary | ICD-10-CM | POA: Diagnosis not present

## 2015-02-11 DIAGNOSIS — F331 Major depressive disorder, recurrent, moderate: Secondary | ICD-10-CM

## 2015-02-11 LAB — ETHYL GLUCURONIDE LC/MS/MS
Ethyl Sulfate LC/MS/MS: 117 ng/mL
Ethyl Sulfate: POSITIVE — AB

## 2015-02-11 LAB — PRESCRIPTION ABUSE MONITORING 17P, URINE
6-Acetylmorphine, Urine: NEGATIVE ng/mL
Amphetamine Scrn, Ur: NEGATIVE ng/mL
BARBITURATE SCREEN URINE: NEGATIVE ng/mL
BENZODIAZEPINE SCREEN, URINE: NEGATIVE ng/mL
Buprenorphine, Urine: NEGATIVE ng/mL
CANNABINOIDS UR QL SCN: NEGATIVE ng/mL
Carisoprodol/Meprobamate, Ur: NEGATIVE ng/mL
Cocaine (Metab) Scrn, Ur: NEGATIVE ng/mL
Creatinine(Crt), U: 60.4 mg/dL (ref 20.0–300.0)
EDDP, Urine: NEGATIVE ng/mL
FENTANYL, URINE: NEGATIVE pg/mL
MDMA Screen, Urine: NEGATIVE ng/mL
MEPERIDINE SCREEN, URINE: NEGATIVE ng/mL
METHADONE SCREEN, URINE: NEGATIVE ng/mL
Nitrite Urine, Quantitative: NEGATIVE ug/mL
OXYCODONE+OXYMORPHONE UR QL SCN: NEGATIVE ng/mL
Opiate Scrn, Ur: NEGATIVE ng/mL
PH UR, DRUG SCRN: 5.9 (ref 4.5–8.9)
PROPOXYPHENE SCREEN URINE: NEGATIVE ng/mL
Phencyclidine Qn, Ur: NEGATIVE ng/mL
SPECIFIC GRAVITY: 1.01
Tapentadol, Urine: NEGATIVE ng/mL
Tramadol Screen, Urine: NEGATIVE ng/mL

## 2015-02-11 LAB — ETHYL GLUCURONIDE, URINE

## 2015-02-11 NOTE — Progress Notes (Signed)
Ian [name] is a 35 y.o. male patient. CD-IOP Treatment Planning Session: Counselor met with patient for 50 min individual treatment planning session. Patient presented as active and engaged in session. Counselor and patient discussed treatment plan and discussed the goals of (1) complete sobriety and (2) building sober social support through [employer]. Patient agreed and signed treatment plan. During session his hands were trembling mildly, but consistently throughout. Counselor asked about patient's sleep, medications, and diet which the client reported were all improving. Patient is sleeping through the night and eating well. Counselor used open questions and reflections to inquire about patient's family of origin story. Patient disclosed that his childhood was mostly positive. He reports that he did not witness any negative consequences with his dad's heavy drinking. Around age 36, patient was "arrested" for underage drinking. His uncle was the state trooper who handled the situation which led to no formal charges. On patient's 16th birthday his mom admitted to having an affair and left to live with her new male partner. Patient continued living with his dad until undergraduate school. Patient spoke fondly of spending summers "at the beach with really wealthy friends". He insinuated a pattern of seeking attachment from wealthy friend's parent's and getting his needs met outside his family. Patient admitted he was very bitter towards his mother initially, but while he was in college she suffered a heart attack which eventually strengthened the mother-son relationship. Patient has a 13 yo son with a woman he met in undergraduate. He shared 50/50 custody until a few years ago when it was discovered that he was drunk while taking care of his son. He currently has visitation with his son every other weekend pending his passing a wirelessly-connected breathalyzer test before picking his son up. Patient has very low  self-esteem and has struggled with recovery in the past. His longest period of sobriety is just over 1 year. Patient's sobriety date is 1/23.        Loreto Loescher, LCAS

## 2015-02-11 NOTE — Progress Notes (Signed)
    Daily Group Progress Note  Program: CD-IOP   Group Time: 1-2:30pm  Participation Level: Active  Behavioral Response: Appropriate and Sharing  Type of Therapy: Process Group  Topic: CD-IOP Group Process Session: Counselor met with patients for group process session. Patients discussed challenges and successes related to their recovery. One new patient was present today who met with the program director.   Group Time: 2:45-4pm  Participation Level: Active  Behavioral Response: Appropriate and Sharing  Type of Therapy: Psycho-education Group  Topic: CD-IOP Group Psycho-Education Session: Counselors met with patients for group psycho-ed session. Counselor discussed the topic of "seeking body, spirit, and mind balance". Counselor and patients also engaged in a 5 min body scan from [employer] and discussed mindfulness resources to aid in recovery.  Summary: Patient presented as active and engaged in session. He reported that he tried going to "old meetings" where he already knew people in order to build his social support network. Patient also revealed that he is interested in finding a job, but he's anxious to ask for help from friends and is nervous he does not qualify for a good position. Patient appears to be in a fragile state both psychologically and physically since he has a lot of free time, few interests, and little motivation right now. Counselor used encouragement to build his self-esteem and other group members encouraged him to look at job websites and reframe his current situation as a "fresh start". Patient's sobriety date is 1/23.  Family Program: Family present? No   Name of family member(s):   UDS collected: No Results:  AA/NA attended?: YesTuesday and Wednesday  Sponsor?: No   Vonette Grosso, LCAS

## 2015-02-11 NOTE — Progress Notes (Signed)
Ian [name] is a 35 y.o. male patient. CD-IOP Individual Session: Counselor met with patient for 50 min individual therapy session. Patient presented as active and engaged in session with a reduction of hand trembling since last session 5 days ago. Counselor asked patient how he had been feeling since their last meeting, to which he responded in the positive. He was able to be with his son and have a good weekend. He reported that he attended some new AA meetings since he is nervous to see old friends at his usual meetings. He displays significant shame around revealing his addiction and repeated relapses. Counselor used CBT to confront patients cognitive distortions around [employer] attendance and judgment. Counselor then focused session on building patient's self-esteem and motivation to remain sober, since he does not have a job and spends significant time alone. Patient is very social but feels ashamed to reach out to people or nervous that he'll "get better and then want to drink again". He identified both positive and negative triggers that cause him to drink, like staying up too late, feeling "really good" and celebrating, or feeling depressed. Counselor encouraged patient to utilize [employer] for free resources and internet and discussed the possibility of volunteering.  Counselor reflected that patient appeared nervous towards the end of session with tentative language and short answers. Patient responded that he's worried about giving himself "another chance" at getting better. Patient's sobriety date remains 1/23.  (written by Youlanda Roys, Counseling Intern).       Ramaya Guile, LCAS

## 2015-02-12 ENCOUNTER — Other Ambulatory Visit (HOSPITAL_COMMUNITY): Payer: BLUE CROSS/BLUE SHIELD

## 2015-02-13 ENCOUNTER — Encounter (HOSPITAL_COMMUNITY): Payer: Self-pay | Admitting: Psychology

## 2015-02-13 NOTE — Progress Notes (Signed)
Daily Group Progress Note  Program: CD-IOP   Group Time: 1-2:30 pm  Participation Level: Active  Behavioral Response: Appropriate and Sharing  Type of Therapy: Process Group  Topic: Process: The first half of group was spent in process. After check-in, counselor reviewed the chronic, progressive, often times fatal nature of this disease of addiction. She contrasted this with cancer and questioned if group members were doing as much about this affliction compared to what they would do if they had received a diagnosis of cancer? The seriousness of their disease of addiction did not seem to register to some of the members present today and they have used excuses such as "I forgot to take my medication today" or "sleeping through the alarm" to any number of seemingly careless explanations. These were not good reasons for failing to follow a routine and structure that would allow for the transformations that must occur within these folks. A discussion followed with members sharing specific details of what they must do to remain abstinent.   Group Time: 2:45- 4pm  Participation Level: Active  Behavioral Response: Appropriate and Sharing  Type of Therapy: Psycho-education Group  Topic: Communication: Role-Playing Assertive Behavior. The second half of group was spent in a psycho-ed on assertive communication. A handout was provided and members took turns reading from parts of it. One member provided an example of what his wife calls "My passive-aggressive" behavior. He recounted some details and group members shared their feelings about what he described. Later members volunteered to role-play scenarios read by the counselor. Their performances in the assigned role plays were quite good and generated good discussion among the group. The ability to identify the different types of communication, including passive, passive-aggressive, aggressive and assertive was emphasized. The ability to express  themselves in an assertive manner will allow group members to more frequently get their needs met and less likely to need chemicals.    Summary: The patient was attentive and engaged in the session today. When asked how he would respond to the opening comments about people taking this illness seriously, he reported he had to be more aware of recognizing cravings and intentions to use before they are out of his control. It was pointed out that this fellow has very little accountability. He lives by himself, does not work, and has not secured another sponsor. He is also going to only 1 meeting per day. He has a lot of time that could be devoted to recovery. The patient explained that when he has relapsed in the past, there was not way he could have stopped himself from going to the [employer] [facility] store.  "I need to act before it is too late", he explained. The patient shared about his relationship with his paretnts and how he feels a tremendous amount of guilt and shame for what he has caused them in terms of financial costs for his treatment. Someone asked if he if thinks he will ever be able to stay sober for a long time? The patient seemed very reluctant to answer this and his lack of self-esteem and confidence was quite evident to everyone present. The patient volunteered for the role-playing and his role was to present in an assertive manner. Almost every one agreed it appeared that he was being aggressive and it demonstrated the misunderstandings many have about the difference between aggreesive and assertive. The patient made some good comments during the session, but he is really struggling with his addiction and remaining sober over this  long weekend may prove difficult. His sobriety date is 1/23.    Family Program: Family present? No   Name of family member(s):   UDS collected: No Results:   AA/NA attended?: YesWednesday and Thursday  Sponsor?: No   Shama Monfils, LCAS

## 2015-02-15 ENCOUNTER — Other Ambulatory Visit (HOSPITAL_COMMUNITY): Payer: Self-pay | Admitting: Medical

## 2015-02-15 ENCOUNTER — Other Ambulatory Visit (HOSPITAL_COMMUNITY): Payer: BLUE CROSS/BLUE SHIELD

## 2015-02-15 ENCOUNTER — Encounter (HOSPITAL_COMMUNITY): Payer: Self-pay | Admitting: Medical

## 2015-02-15 ENCOUNTER — Other Ambulatory Visit (HOSPITAL_COMMUNITY): Payer: BLUE CROSS/BLUE SHIELD | Admitting: Licensed Clinical Social Worker

## 2015-02-15 DIAGNOSIS — F1020 Alcohol dependence, uncomplicated: Secondary | ICD-10-CM

## 2015-02-15 DIAGNOSIS — L0291 Cutaneous abscess, unspecified: Secondary | ICD-10-CM | POA: Insufficient documentation

## 2015-02-15 DIAGNOSIS — K668 Other specified disorders of peritoneum: Secondary | ICD-10-CM | POA: Insufficient documentation

## 2015-02-15 DIAGNOSIS — L0390 Cellulitis, unspecified: Secondary | ICD-10-CM

## 2015-02-15 NOTE — Progress Notes (Signed)
Patient ID: Ian [name], male   DOB: Jul 26, 1980, 35 y.o.   MRN: [id number] S-Pt requesting to be seen for sore on lt nasal skin.Pt gives hx of previous infection treated with antibiotics.Now has erythemous painful skin nodule lt nasal skin with tender Lt cervical adenopathy.Neosporin not helping.No fever;chills;nausea;vomiting O-Lt side of nose is erythematous with a 2cm subcutaneous nodule exquisitely tender and associated Lt ant JD adenopathy A Cellulitis lt external nasal skin with abscess R/O MRSA P-Pt is informed that infection is in dangerous area and that he needs to go to ED or Urgent Care for treatment.Says he is unable to return to PCP Urgent Care as he went and they wouldnt see him because he owed money.Informed the serious location of his infection should take precedence and that HE NEEDS TO GO ED if he is unable to get care elsewhere due to current finances.Advised he has plenty of time to make amends/pay back in sobriety

## 2015-02-16 ENCOUNTER — Encounter (HOSPITAL_COMMUNITY): Payer: Self-pay | Admitting: Licensed Clinical Social Worker

## 2015-02-16 ENCOUNTER — Other Ambulatory Visit (HOSPITAL_COMMUNITY): Payer: BLUE CROSS/BLUE SHIELD

## 2015-02-16 NOTE — Progress Notes (Signed)
    Daily Group Progress Note  Program: CD-IOP   Group Time: 1-2:30  Participation Level: Active  Behavioral Response: Appropriate and Sharing  Type of Therapy: Process Group  Topic: The first part of group was spent in process where group members shared about obstacles, challenges and recovery-related activities since the last group. There was good disclosure and feedback among group members. One group member was absent. Two group members met with Deedra Ehrich, PA-C. Drug test were handout out. One group member relapsed and the relapse was discussed using through trigger - thought-craving use.    Group Time: 2:45-4  Participation Level: Active  Behavioral Response: Appropriate and Sharing  Type of Therapy: Process Group  Topic:The second part of group focused on accountability. Group members discussed the necessity of having a release of information for loved ones while in the CD-IOP program. This topic led to much discussion by group members. The topic was brought up by the group member who relapsed and revoked her release of information for her mother. As this point was discussed another patient interjected, "I don't know why I'm sober." Group members were many opinions about "why to stay sober."       Summary: Patient reported he went to a meeting Thursday, 2 Friday and 2 Saturday. He did not go to any meetings Sunday, however he went to church. Patient reported he went to the [place] super bowl party, but didn't stay long. Patient reports his parents are out of town and every time his parents go out of town, he starts drinking. He had many texts from his parents and he reported he was "aggravated" by the continuous texts checking on him. He was so aggravated he refused to text his mother back. Another group member gave patient a suggestion and of a more positive way to respond to his mother. He readily accepted the suggestion.  Patient was the one who brought up "I don't know why I'm  sober." He expressed to the group that he doesn't know what is different this time. He was able to identify reasons why he is choosing sobriety: son, continuous health issues and desire to have a better relationship with his parents. Patient saw Deedra Ehrich, PA program director during group. Patient reported he grudgingly filled out the release of information for his parents because he felt it was a necessity to stay in the program. Pt reports his ambivalence of his parents knowing too much and the shame he feels when he relapses. Patient was engaged in the intervention. His sobriety date remains 1/23.   Family Program: Family present? No   Name of family member(s):   UDS collected: Yes Results:  AA/NA attended?: YesThursday, Friday and Saturday  Sponsor?: No   [name],[name], Licensed Cli

## 2015-02-17 ENCOUNTER — Other Ambulatory Visit (HOSPITAL_COMMUNITY): Payer: BLUE CROSS/BLUE SHIELD

## 2015-02-17 ENCOUNTER — Encounter (HOSPITAL_COMMUNITY): Payer: Self-pay | Admitting: Medical

## 2015-02-17 ENCOUNTER — Other Ambulatory Visit (HOSPITAL_COMMUNITY): Payer: BLUE CROSS/BLUE SHIELD | Admitting: Psychology

## 2015-02-17 DIAGNOSIS — F1994 Other psychoactive substance use, unspecified with psychoactive substance-induced mood disorder: Secondary | ICD-10-CM

## 2015-02-17 DIAGNOSIS — F1020 Alcohol dependence, uncomplicated: Secondary | ICD-10-CM

## 2015-02-17 DIAGNOSIS — L0390 Cellulitis, unspecified: Secondary | ICD-10-CM

## 2015-02-17 DIAGNOSIS — L0291 Cutaneous abscess, unspecified: Secondary | ICD-10-CM

## 2015-02-17 DIAGNOSIS — F411 Generalized anxiety disorder: Secondary | ICD-10-CM

## 2015-02-17 MED ORDER — MUPIROCIN CALCIUM 2 % EX CREA: 1.0000 "application " | TOPICAL_CREAM | Freq: Three times a day (TID) | CUTANEOUS | Status: DC

## 2015-02-17 NOTE — Progress Notes (Signed)
   [facility] Follow-up Outpatient Visit  MATIX HENSHAW 04-Jun-1980  Date: 02/17/2015   Subjective: Abscess much improved-"I went to [facility] -they gave me an antibiotic that starts with a "D" (Doxycycline) HPI-Pt having good response to Doxycycline with decrease in pain ;cellulitis;size of abscess. Soaked with warm compresses last nite.Had drainage.No topical RX but he used Hydrocortisone due to severe pruritis x 1 for relief.Had similar problem with leg last month. ROS-No fever ;chills;malaise;no other lesions There were no vitals filed for this visit.  Mental Status Examination  Appearance: Neat;well groomed;Nose improved -erythema decreased;abscess 1 cm with central 1mm pustule. Alert: Yes Attention: good  Cooperative: Yes Eye Contact: Good Speech: Normal Psychomotor Activity: Normal Memory/Concentration: Intact Oriented: person, place, time/date and situation Mood: Euthymic Affect: Congruent Thought Processes and Associations: Coherent and Logical Fund of Knowledge: Good Thought Content: WDL;Believes immune system  "is shot" Insight: Fair Judgement: Fair  Diagnosis:   1.   Alcohol use disorder, severe, dependence (HCC)  303.90 F10.20           2.   Substance induced mood disorder (HCC)  292.84 F19.94        3.   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   Cellulitis and abscess suspect community acquired  MRSA  682.9 L03.90 ...        Treatment Plan: Add Bactroban topically;Continue Doxycycline,warm compresses,FU per Urgent Care ;Continue CD IOP  Maryjean Morn, PA-C

## 2015-02-18 ENCOUNTER — Encounter (HOSPITAL_COMMUNITY): Payer: Self-pay | Admitting: Psychology

## 2015-02-18 ENCOUNTER — Other Ambulatory Visit (HOSPITAL_COMMUNITY): Payer: Self-pay | Admitting: Medical

## 2015-02-18 ENCOUNTER — Other Ambulatory Visit (HOSPITAL_COMMUNITY): Payer: BLUE CROSS/BLUE SHIELD | Admitting: Psychology

## 2015-02-18 ENCOUNTER — Other Ambulatory Visit (HOSPITAL_COMMUNITY): Payer: BLUE CROSS/BLUE SHIELD

## 2015-02-18 DIAGNOSIS — F1020 Alcohol dependence, uncomplicated: Secondary | ICD-10-CM | POA: Diagnosis not present

## 2015-02-18 DIAGNOSIS — L0390 Cellulitis, unspecified: Principal | ICD-10-CM

## 2015-02-18 DIAGNOSIS — F331 Major depressive disorder, recurrent, moderate: Secondary | ICD-10-CM

## 2015-02-18 DIAGNOSIS — L0291 Cutaneous abscess, unspecified: Secondary | ICD-10-CM

## 2015-02-18 MED ORDER — MUPIROCIN 2 % EX OINT: 1.0000 "application " | TOPICAL_OINTMENT | Freq: Three times a day (TID) | CUTANEOUS | Status: DC

## 2015-02-18 NOTE — Progress Notes (Signed)
Daily Group Progress Note  Program: CD-IOP   Group Time: 1-2:30 pm  Participation Level: Active  Behavioral Response: Appropriate and Sharing  Type of Therapy: Process Group  Topic: Process: the first half of group was spent in process. Members shared about their actions and activities spent to support or strengthen their early recovery. Also shared were any thoughts or even cravings that they may have experienced since this group last met. A member who had relapsed and been upstairs returned to group today and she received a warm welcome back. A PA student with [employer] was also present to observe the group as part of her rotation. The program director met with 3 group members to review medications or any other concerns he or they may have expressed. One drug test was collected from a group member who had missed group this past Monday.   Group Time: 2:45- 4pm  Participation Level: Active  Behavioral Response: Sharing  Type of Therapy: Psycho-education Group  Topic: Psycho-Ed: visit from the Chaplain and the topic of Anger. The second half of group was spent in a psycho-ed with the chaplain, BM, who is regularly scheduled to visit with this group twice per month. After introductions, the guest brought up the issue of anger and group members shared as he invited them to describe what they had been taught as children about anger. Most members had been taught it was bad, not to be expressed, or even disrespectful if anger was expressed. The session continued with a dialogue with a member and the chaplain as he led her into an intense conversation about her experiences of anger. The session brought some relief as she shared her anger and the subsequent release that came through her disclosure. There was a greater degree of intensity and presence displayed in group today than in previous sessions.    Summary: The patient was attentive and engaged in group today. He was more dressed  up than in previous sessions and his fellow group members commented on how good he looked. The patient had also gotten a haircut. 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 He had a good track record of previous employment with this same company and he left on good terms. The patient reported he had attended 3 AA meetings since the last group session. He had also reconnected with some old friends and returned to certain meetings he had been reluctant to attend - mostly out of shame for having relapsed. The patient had also gone to [employer] and inquired about volunteering and gathered information for the orientation sessions. He also reported he had seen a physician as instructed by our program director and received some anti-biotics.  In the psycho-ed, the patient reported he had witnessed very little yelling or anger displayed by his parents when he was younger. Despite the absence of problems, his mother left his father when he was 35 yo and it was a huge blow to everyone involved. The patient admitted he was very angry with his mother for a number of years. His counselor pointed out that in previous sessions this patient has displayed passive or passive-aggressive tendencies. He wondered if the patient never learned to express anger and has kept it in because of that lack of modeling in his childhood? The patient made some good comments during this psycho-ed and displayed more energy and presence than in previous sessions. He met briefly with the program  director during the process session of group. He responded well to this intervention and his sobriety date remains 1/23.    Family Program: Family present? No   Name of family member(s):   UDS collected: No Results:   AA/NA attended?: [place] and Tuesday  Sponsor?: No   Marina Desire, LCAS

## 2015-02-19 ENCOUNTER — Encounter (HOSPITAL_COMMUNITY): Payer: Self-pay | Admitting: Psychology

## 2015-02-19 ENCOUNTER — Other Ambulatory Visit (HOSPITAL_COMMUNITY): Payer: BLUE CROSS/BLUE SHIELD

## 2015-02-19 LAB — PRESCRIPTION ABUSE MONITORING 17P, URINE
6-ACETYLMORPHINE, URINE: NEGATIVE ng/mL
AMPHETAMINE SCREEN URINE: NEGATIVE ng/mL
BARBITURATE SCREEN URINE: NEGATIVE ng/mL
BENZODIAZEPINE SCREEN, URINE: NEGATIVE ng/mL
BUPRENORPHINE, URINE: NEGATIVE ng/mL
CANNABINOIDS UR QL SCN: NEGATIVE ng/mL
CARISOPRODOL/MEPROBAMATE, UR: NEGATIVE ng/mL
COCAINE(METAB.)SCREEN, URINE: NEGATIVE ng/mL
CREATININE(CRT), U: 119.9 mg/dL (ref 20.0–300.0)
EDDP, Urine: NEGATIVE ng/mL
Fentanyl, Urine: NEGATIVE pg/mL
MDMA SCREEN, URINE: NEGATIVE ng/mL
MEPERIDINE SCREEN, URINE: NEGATIVE ng/mL
METHADONE SCREEN, URINE: NEGATIVE ng/mL
Nitrite Urine, Quantitative: NEGATIVE ug/mL
OPIATE SCREEN URINE: NEGATIVE ng/mL
OXYCODONE+OXYMORPHONE UR QL SCN: NEGATIVE ng/mL
PHENCYCLIDINE QUANTITATIVE URINE: NEGATIVE ng/mL
Ph of Urine: 8.5 (ref 4.5–8.9)
Propoxyphene Scrn, Ur: NEGATIVE ng/mL
SPECIFIC GRAVITY: 1.022
TAPENTADOL, URINE: NEGATIVE ng/mL
Tramadol Screen, Urine: NEGATIVE ng/mL

## 2015-02-19 LAB — ETHYL GLUCURONIDE, URINE: ETHYL GLUCURONIDE SCREEN, UR: NEGATIVE ng/mL

## 2015-02-19 NOTE — Progress Notes (Signed)
    Daily Group Progress Note  Program: CD-IOP   Group Time: 1-2:30 pm  Participation Level: Active  Behavioral Response: Appropriate and Sharing  Type of Therapy: Process Group  Topic: CD-IOP Group Process Session: Counselors met with patients for group process session. One member was absent. Counselor began session with mindfulness meditation on "centering". Counselors guided the patients through a discussion on recovery, challenges, and successes for each person. Patients also brainstormed recovery-related behaviors that they could take part in for the weekend.  Group Time: 2:45- 4pm  Participation Level: Active  Behavioral Response: Appropriate and Sharing  Type of Therapy: Psycho-education Group  Topic: CD-IOP Group Psycho-Ed Session: Counselors met with patients for group psycho-ed session with the topic of "emotions". Counselor utilized a DBT skills work sheet to discuss the "continuum of feelings". Counselor discussed a non-judgmental attitude towards feelings. Counselor led a game of "Emotion Ines Bloomer" to help clients explore different feelings safely in the group.   Summary: Patient presented as upbeat, active, and engaged in group. He attended 2 AA meetings since our last group. He continues to present with a well-groomed and positive affect despite admitting he is still struggling with guilt and shame from his addiction. Patient is showing progress by attending AA meetings when he does not "feel like it". He is staying late and being social at the [employer]. Counselor encouraged him to trust his plans, not his feelings in early recovery. 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 Patient was somewhat reserved during the game but he did discuss both negative and positive emotions from his past. Patient's sobriety date remains 1/23.   Family Program: Family present? No   Name of family member(s):   UDS collected:  No Results:   AA/NA attended?: YesWednesday and Thursday  Sponsor?: No   Nyriah Coote, LCAS

## 2015-02-21 NOTE — Progress Notes (Signed)
Ian [name] is a 35 y.o. male patient. CD-IOP Individual Counseling Session: Counselor met with patient for weekly individual counseling session at the conclusion of the group session this afternoon.  Patient presented as upbeat and active in session with steady eye contact and significant reduction of twitching in his hands. Patient was well-groomed and dressed appropriately. Counselor opened session by having patient fill out an ORS and PHQ-9. Patient's PHQ-9 scores were significantly lower than when he entered program around 2 weeks ago. Counselor asked patient how he had been feeling to which patient replied "fine, but bored". Counselor asked if patient desired to drink, to which the patient responded "yes, in an ideal world". Counselor asked about his [employer] attendance which led the conversation to patient's significant feelings of guilt for borrowing money from his step dad. Counselor used emotion-focusing techniques to help client get in touch with his felt experience of guilt and shame around his addiction. Patient was tearful for parts of session, especially when discussing how his addiction has saddened his parents. Counselor asked patient to focus on his feelings of guilt around borrowing money and imagine what he would say to his stepfather (the loaner of the money). Patient said he felt a "pain in my chest" when discussing the guilt. Counselor helped direct patients experience of the here-and-now emotions and allowed him to speak freely as if his stepfather was present. Patient then took on the role of his stepfather and responded to patient's feelings of guilt. Counselor helped patient to see that his stepfather wants the best for him and believes he can change. Patient showed significant struggle in session to name his guilt and express his feelings in detail. Counselor will continue to utilize emotion-focusing techniques, CBT, and mindfulness to unpack client's guilt and work towards living in  recovery. Patient responded well to this intervention and his sobriety date remains 1/23.  Youlanda Roys, Counseling Intern, 02/18/15).        Tailyn Hantz, LCAS

## 2015-02-22 ENCOUNTER — Other Ambulatory Visit (HOSPITAL_COMMUNITY): Payer: BLUE CROSS/BLUE SHIELD

## 2015-02-22 ENCOUNTER — Other Ambulatory Visit (HOSPITAL_COMMUNITY): Payer: BLUE CROSS/BLUE SHIELD | Admitting: Psychology

## 2015-02-22 ENCOUNTER — Other Ambulatory Visit (HOSPITAL_COMMUNITY): Payer: Self-pay | Admitting: Medical

## 2015-02-23 ENCOUNTER — Other Ambulatory Visit (HOSPITAL_COMMUNITY): Payer: BLUE CROSS/BLUE SHIELD

## 2015-02-24 ENCOUNTER — Other Ambulatory Visit (HOSPITAL_COMMUNITY): Payer: BLUE CROSS/BLUE SHIELD | Admitting: Psychology

## 2015-02-24 ENCOUNTER — Other Ambulatory Visit (HOSPITAL_COMMUNITY): Payer: BLUE CROSS/BLUE SHIELD

## 2015-02-24 DIAGNOSIS — F1020 Alcohol dependence, uncomplicated: Secondary | ICD-10-CM

## 2015-02-24 LAB — PRESCRIPTION ABUSE MONITORING 17P, URINE
6-Acetylmorphine, Urine: NEGATIVE ng/mL
AMPHETAMINE SCREEN URINE: NEGATIVE ng/mL
BARBITURATE SCREEN URINE: NEGATIVE ng/mL
BENZODIAZEPINE SCREEN, URINE: NEGATIVE ng/mL
BUPRENORPHINE, URINE: NEGATIVE ng/mL
CANNABINOIDS UR QL SCN: NEGATIVE ng/mL
CARISOPRODOL/MEPROBAMATE, UR: NEGATIVE ng/mL
COCAINE(METAB.)SCREEN, URINE: NEGATIVE ng/mL
CREATININE(CRT), U: 273.5 mg/dL (ref 20.0–300.0)
EDDP, URINE: NEGATIVE ng/mL
Fentanyl, Urine: NEGATIVE pg/mL
MDMA Screen, Urine: NEGATIVE ng/mL
MEPERIDINE SCREEN, URINE: NEGATIVE ng/mL
Methadone Screen, Urine: NEGATIVE ng/mL
NITRITE URINE, QUANTITATIVE: NEGATIVE ug/mL
OXYCODONE+OXYMORPHONE UR QL SCN: NEGATIVE ng/mL
Opiate Scrn, Ur: NEGATIVE ng/mL
PHENCYCLIDINE QUANTITATIVE URINE: NEGATIVE ng/mL
Ph of Urine: 5.9 (ref 4.5–8.9)
Propoxyphene Scrn, Ur: NEGATIVE ng/mL
SPECIFIC GRAVITY: 1.027
TRAMADOL SCREEN, URINE: NEGATIVE ng/mL
Tapentadol, Urine: NEGATIVE ng/mL

## 2015-02-24 LAB — ETHYL GLUCURONIDE, URINE: Ethyl Glucuronide Screen, Ur: NEGATIVE ng/mL

## 2015-02-25 ENCOUNTER — Encounter (HOSPITAL_COMMUNITY): Payer: Self-pay | Admitting: Psychology

## 2015-02-25 ENCOUNTER — Other Ambulatory Visit (HOSPITAL_COMMUNITY): Payer: BLUE CROSS/BLUE SHIELD | Admitting: Licensed Clinical Social Worker

## 2015-02-25 DIAGNOSIS — F1020 Alcohol dependence, uncomplicated: Secondary | ICD-10-CM | POA: Diagnosis not present

## 2015-02-25 NOTE — Progress Notes (Signed)
    Daily Group Progress Note  Program: CD-IOP   Group Time: 1-2:30   Participation Level: Active  Behavioral Response: Appropriate and Sharing  Type of Therapy: Process Group  Topic: Counselor met with patients for a 1.5 hour group process time. Patients discussed recent challenges to their sobriety and updated the group on their goals and successes. Group members spent significant time processing member's recent returns to use. Counselor used reflection, open ended questions, and Motivational Interviewing to help resolve ambivalence.   Group Time: 2:45-4pm  Participation Level: Active  Behavioral Response: Appropriate and Sharing  Type of Therapy: Psycho-education Group  Topic: Counselors met with patients for 1.25 hour group psycho-ed time. Counselor taught on core beliefs using Group CBT and inviting members to challenge their core beliefs around alcohol and drugs. Patients were active and engaged in the discussion and reported positive feedback.   Summary: Patient reported that he attended 3 AA meetings since our last group. His presentation continues to change with more open communication, body posture, and laughing in group. He is engaging others in his [place] meetings and reports that he has gotten food socially with friends in recovery. He reported that his sleep has been restless the past few days. He has selected a few individuals in AA that could be a potential temporary sponsor. He is still unsure of "why he is not drinking" but counselor continued to encourage him to focus on the positives and keep "doing the next right thing". Patient is making good progress in his recovery and seems motivated and honest in group. Patient's sobriety date remains 1/23.   Family Program: Family present? No   Name of family member(s):   UDS collected: No Results: negative  AA/NA attended?: YesMonday, Tuesday and Wednesday  Sponsor?: No   Wes Rana Snare, LCAS

## 2015-02-26 ENCOUNTER — Encounter (HOSPITAL_COMMUNITY): Payer: Self-pay | Admitting: Licensed Clinical Social Worker

## 2015-02-26 ENCOUNTER — Other Ambulatory Visit (HOSPITAL_COMMUNITY): Payer: BLUE CROSS/BLUE SHIELD

## 2015-02-26 NOTE — Progress Notes (Signed)
    Daily Group Progress Note  Program: CD-IOP   Group Time: 1-2:30  Participation Level: Active  Behavioral Response: Appropriate and Sharing  Type of Therapy: Process Group  Topic: CD-IOP Group Process Session: Counselors met with patients for a 1.5 hour group process session in which patients discussed their challenges and successes with sobriety. Counselor encouraged patients' openness and vulnerability when sharing. Additionally, counselor discussed "group rules" and invited members' feedback for making the group safer and more helpful. One member was 30 mins late due to a pre-schedule psychiatric appointment. One new member was present for his first group.    Group Time: 2:45-4  Participation Level: Active  Behavioral Response: Appropriate and Sharing  Type of Therapy: Psycho-education Group  Topic: Counselors met with patients for 1.25 hour group psycho-education session in which counselor taught on the Fellowship of AA, meeting structure, and sponsorship. Patients were active and engaged in session and helped answer questions from the new member.      Summary: : Pt reported he went to a meeting last night. The meeting has a lot of young people in it and he told another group member he thought he would like due to the age of most attendees. Pt is still struggling with making a decision about a temporary sponsor. He has struggled in the past and only made previous sponsor decisions when someone pushes him to choose someone specific as his sponsor. Several other group members who go to the same meetings suggested several people who could be his temporary sponsor. Patient does not want to be held accountable for definitively making a decision about a temporary sponsor. Pt got hired at [employer] and began orientation today. He is stressed about his new hours interfering with the CD-IOP program. He was assured that we can work something out to help him continue the program. Pt is  working very hard on staying sober. His sobriety date remains 1/23.   Family Program: Family present? No   Name of family member(s):   UDS collected: No Results:   AA/NA attended?: YesWednesday  Sponsor?: No   Dashan Chizmar S, Licensed Cli

## 2015-03-01 ENCOUNTER — Other Ambulatory Visit (HOSPITAL_COMMUNITY): Payer: BLUE CROSS/BLUE SHIELD | Admitting: Licensed Clinical Social Worker

## 2015-03-01 DIAGNOSIS — F1020 Alcohol dependence, uncomplicated: Secondary | ICD-10-CM

## 2015-03-02 ENCOUNTER — Other Ambulatory Visit (HOSPITAL_COMMUNITY): Payer: BLUE CROSS/BLUE SHIELD

## 2015-03-03 ENCOUNTER — Other Ambulatory Visit (HOSPITAL_COMMUNITY): Payer: BLUE CROSS/BLUE SHIELD | Admitting: Psychology

## 2015-03-03 ENCOUNTER — Other Ambulatory Visit (HOSPITAL_COMMUNITY): Payer: Self-pay | Admitting: Medical

## 2015-03-03 ENCOUNTER — Encounter (HOSPITAL_COMMUNITY): Payer: Self-pay | Admitting: Licensed Clinical Social Worker

## 2015-03-03 DIAGNOSIS — F1020 Alcohol dependence, uncomplicated: Secondary | ICD-10-CM | POA: Diagnosis not present

## 2015-03-03 DIAGNOSIS — F1994 Other psychoactive substance use, unspecified with psychoactive substance-induced mood disorder: Secondary | ICD-10-CM

## 2015-03-03 DIAGNOSIS — F411 Generalized anxiety disorder: Secondary | ICD-10-CM

## 2015-03-03 MED ORDER — HYDROXYZINE HCL 50 MG PO TABS: 50.0000 mg | ORAL_TABLET | Freq: Four times a day (QID) | ORAL | Status: DC | PRN

## 2015-03-03 NOTE — Progress Notes (Signed)
    Daily Group Progress Note  Program: CD-IOP   Group Time: 1-2:30  Participation Level: Active  Behavioral Response: Appropriate and Sharing  Type of Therapy: Process Group  Topic: The first part of group was spent in process where group members shared about obstacles, challenges and recovery-related activities since the last group. There was good disclosure and feedback among group members. Group members also shared any urges or cravings they had experienced over the weekend. One group member was absent. There were two new group members today; they introduced themselves to the group sharing a little of what brought them into the program. Two group members met with C. Harold Hedge, PA-C. Drug tests were handed out. One group member graduated from the program today. All group members gave her a positive send-off and well-wishes.     Group Time: 2:45-4  Participation Level: Active  Behavioral Response: Appropriate and Sharing  Type of Therapy: Psycho-education Group  Topic: The second part of group focused on addiction and the brain, and then moved into using CBT challenging core beliefs around alcohol and drugs. Pts viewed a 7 minute clip on You Tube that depicted the limbic system and the effect of addiction on the dopamine levels in the brain. Patients also identified 4 main reasons people use AOD: relief, escape, pleasure-seeking, and problem-solving.        Summary: Pt reported he had his son over the weekend, which is the first time in a long time. He reported things went well. He went to his meetings Thursday, Friday and Sunday nights. He has identified someone that he may want to ask to be his sponsor. He reports he had no using thoughts or cravings. Pt has begun his new job a few hours a day.  Marland Kitchen Pt participated in the CBT challenge of core beliefs around his alcohol use. He participated well in the group today and benefitted from the intervention. His sobriety date remains  1/23.   Family Program: Family present? No   Name of family member(s):   UDS collected: Yes Results:   AA/NA attended?: YesThursday, Friday and Sunday  Sponsor?: No   [name],[name], Licensed Cli

## 2015-03-04 ENCOUNTER — Other Ambulatory Visit (HOSPITAL_COMMUNITY): Payer: BLUE CROSS/BLUE SHIELD | Admitting: Psychology

## 2015-03-04 ENCOUNTER — Encounter (HOSPITAL_COMMUNITY): Payer: Self-pay | Admitting: Psychology

## 2015-03-04 DIAGNOSIS — K852 Alcohol induced acute pancreatitis without necrosis or infection: Secondary | ICD-10-CM

## 2015-03-04 DIAGNOSIS — F331 Major depressive disorder, recurrent, moderate: Secondary | ICD-10-CM

## 2015-03-04 DIAGNOSIS — F1020 Alcohol dependence, uncomplicated: Secondary | ICD-10-CM | POA: Diagnosis not present

## 2015-03-05 ENCOUNTER — Encounter (HOSPITAL_COMMUNITY): Payer: Self-pay | Admitting: Psychology

## 2015-03-05 NOTE — Progress Notes (Signed)
    Daily Group Progress Note  Program: CD-IOP   Group Time: 1-2:30pm  Participation Level: Active  Behavioral Response: Appropriate and Sharing  Type of Therapy: Process Group  Topic:Counselors met with group members for a process-oriented session. Patients shared about their addictions, recovery life, challenges, and successes while in treatment. Counselor used CBT Socratic questioning to challenge irrational beliefs related to addiction, and Motivational Interviewing to resolve ambivalence. Program director met with some patients. Drug tests were administered for some patients.   Group Time: 2:45-4pm  Participation Level: Active  Behavioral Response: Appropriate and Sharing  Type of Therapy: Psycho-education Group  Topic:Counselors met with group members for a psycho-educational session. A [employer] led a discussion and lesson on a CBT "ABC" model. Group members shared their experiences with undesirable feelings such as anger and sadness. Counselor and Chaplain confronted beliefs/thinking about the activating event, which led to feelings. Patients learned new coping strategies for resolving undesirable feelings.    Summary: Patient presented as active and engaged in group today. He reported that he is enjoying his new employment at [employer], but that he does occasionally worry about returning to drinking. He seems to frequently bring his past experience and relapses into his present thinking, which contributes to his anxiety. He reported that he attended 2 AA meetings since our last group. He reported on his time with his son, which went well. Patient seems to be making significant progress in the program and is actively engaging in his recovery through meetings and reaching out to others for support. He reported on his parents which is a significant stressor in his life and contributes to feelings of extreme guilt for his addiction and borrowing money. Patient's sobriety  date remains 1/23. Youlanda Roys)   Family Program: Family present? No   Name of family member(s):   UDS collected: No Results: negative  AA/NA attended?: [place] and Wednesday  Sponsor?: No   Arria Naim, LCAS

## 2015-03-07 ENCOUNTER — Encounter (HOSPITAL_COMMUNITY): Payer: Self-pay | Admitting: Psychology

## 2015-03-07 NOTE — Progress Notes (Signed)
    Daily Group Progress Note  Program: CD-IOP   Group Time: 1-2:30 pm  Participation Level: Active  Behavioral Response: Appropriate and Sharing  Type of Therapy: Process Group  Topic: Process/CBT Review: the first half of group was spent in process. Members shared about current issues and challenges in early recovery. 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 A review of the presentation yesterday was provided. The basic CBT concepts were repeated and 2 members offered examples of recent events that had made them very upset. These specific examples were excellent and allowed group members to understand how we think about events is what generates our emotional response or feelings.   Group Time: 2:45- 4pm  Participation Level: Active  Behavioral Response: Appropriate  Type of Therapy: Psycho-education Group  Topic: Psycho-Ed/Activity: the second half of group was spent discussing the importance of building structure and routine in one's daily life. A handout was provided with a daily schedule and each hour blocked off in time. Members were instructed to complete these schedules over the weekend and they will be reviewed and discussed on Monday. They were asked to complete through the end of next week. It was explained that they should already know what meetings they will attend and what other events are on their schedules days before they do them. Being committed and accountable are imperative in early recovery and attributes very uncommon to the active addict. An activity was then engaged in that was called, "Two truths and a Lie". It was fun and very challenging and the group had a tough time identifying everyone. Members laughed and got to know each other a little better through this activity and it was quite effective.   Summary: The patient reported he had attended the AA meeting last night. The group applauded his 30  days as of today. He reported he would pick up a chip this evening. The patient reported he is doing a little better about his tendency to procrastinate and noted that he had responded to this old sponsor's text and he had responded in return. The patient has identified procrastinating as a problem he is seeking to improve upon. The patient reported that he is working this weekend so he won't be able to visit with his son. He and the 66 yo's mother share custody. The patient is pleased to be working, but must remain vigilant because, historically, he has relapsed because he thinks he deserves a drink at the end of his work day. He was engaged and offered feedback during the CBT presentation and was very receptive to the weekly schedule given him after the break. During the activity, the patient laughed and made good comments. He was the final member identified and the counselor announced that he was the winner! The patient is making good progress in early recovery and he responded well to this intervention. His sobriety date remains 1/23.   Family Program: Family present? No   Name of family member(s):   UDS collected: No Results:  AA/NA attended?: YesWednesday  Sponsor?: No   Petrina Melby, LCAS

## 2015-03-08 ENCOUNTER — Other Ambulatory Visit (HOSPITAL_COMMUNITY): Payer: BLUE CROSS/BLUE SHIELD | Admitting: Licensed Clinical Social Worker

## 2015-03-08 DIAGNOSIS — F1020 Alcohol dependence, uncomplicated: Secondary | ICD-10-CM

## 2015-03-09 ENCOUNTER — Encounter (HOSPITAL_COMMUNITY): Payer: Self-pay | Admitting: Licensed Clinical Social Worker

## 2015-03-09 NOTE — Progress Notes (Signed)
    Daily Group Progress Note  Program: CD-IOP   Group Time: 1-2:30  Participation Level: Active  Behavioral Response: Appropriate and Sharing  Type of Therapy: Process Group  Topic: The first part of group was spent in process where group members shared about obstacles, challenges and recovery-related activities since the last group. There was good disclosure and feedback among group members. Group members also shared any urges or cravings they had experienced. One group member reported she drank Saturday. The relapse was mapped out: trigger>thought>craving>use. Two group members met with C. Kober, PA-C. Drug test results were returned and 1drug test was taken.   The second part of group focused on "Recovery: Total Surrender." A former group member spoke to the CD-IOP group today. The speaker spoke on how her alcoholism: how it started and where it ended. The speaker spoke tearfully about how her years of use affected her relationships with her family, friends, and employers. The former group member has been sober for 7 months and is currently in the aftercare program at [facility]. All group members listened intently, some asked questions and some male group members asked for her phone number. The group reported they really appreciated her story of hope.     Group Time: 2:45-4  Participation Level: Active  Behavioral Response: Appropriate and Sharing  Type of Therapy: Psycho-education Group  Topic: The second part of group focused on "Recovery: Total Surrender." A former group member spoke to the CD-IOP group today. The speaker spoke on how her alcoholism: how it started and where it ended. The speaker spoke tearfully about how her years of use affected her relationships with her family, friends, and employers. The former group member has been sober for 7 months and is currently in the aftercare program at [facility]. All group members listened intently, some asked questions and some male group  members asked for her phone number. The group reported they really appreciated her story of hope.      Summary: Pt was 20 minutes late due to his new work schedule. Pt reported he went to a meeting Thursday, Saturday and Sunday night. He did not go Friday because he had his son for the weekend. He picked up his 30-day chip and received a round of applause from the group. He reported that he did have thoughts of drinking over the weekend. He still has not gotten a sponsor. He was attentive during the presentation and asked appropriate questions. His sobriety date is 1/23.   Family Program: Family present? No   Name of family member(s):   UDS collected: No Results:   AA/NA attended?: YesMonday, Saturday and Sunday  Sponsor?: No   , S, Licensed Cli        

## 2015-03-10 ENCOUNTER — Other Ambulatory Visit (HOSPITAL_COMMUNITY): Payer: BLUE CROSS/BLUE SHIELD | Attending: Medical | Admitting: Psychology

## 2015-03-10 ENCOUNTER — Other Ambulatory Visit (HOSPITAL_COMMUNITY): Payer: Self-pay | Admitting: Medical

## 2015-03-10 DIAGNOSIS — F419 Anxiety disorder, unspecified: Secondary | ICD-10-CM | POA: Diagnosis not present

## 2015-03-10 DIAGNOSIS — Z87891 Personal history of nicotine dependence: Secondary | ICD-10-CM | POA: Diagnosis not present

## 2015-03-10 DIAGNOSIS — F1020 Alcohol dependence, uncomplicated: Secondary | ICD-10-CM | POA: Diagnosis not present

## 2015-03-10 DIAGNOSIS — F331 Major depressive disorder, recurrent, moderate: Secondary | ICD-10-CM

## 2015-03-10 DIAGNOSIS — F411 Generalized anxiety disorder: Secondary | ICD-10-CM

## 2015-03-10 NOTE — Progress Notes (Signed)
Ian [name] is a 35 y.o. male patient. CD-IOP Individual Session: Counselor met with patient for a weekly individual session. Patient presented with an upbeat, engaged affect. He answered questions directly and seemed to gain insight into his emotions related to recovery. Counselor inquired about patient's new job situation which, while overall positive, is contributing to significant stress since his work schedule is not congruent with the CD-IOP schedule. Patient revealed that he is highly motivated to work while in CD-IOP since he feels like they contribute positively to each other. Counselor inquired about patients slight agitation (neck twitching, hand tremors). Patient revealed that he was frustrated with group members who he felt were being dishonest. Counselor helped to process patient's undesirable feelings towards other group members through CBT and socratic questioning. Patient suggested he may be feeling resentful since he remembers making excuses when he first began treatment. Counselor reflected that perhaps his "old ghosts" are haunting him and these frustrated feelings are reminding him that he still has guilt about the way he used to behave and think. Patient agreed and seemed to thoughtfully process the reframe. Counselor encouraged patient to continue with AA attendance and reaching out when he experiences cravings. Patient's sobriety date remains 1/23. Youlanda Roys, Counseling Intern)       Brandon Melnick, LCAS

## 2015-03-11 ENCOUNTER — Other Ambulatory Visit (HOSPITAL_COMMUNITY): Payer: BLUE CROSS/BLUE SHIELD | Admitting: Psychology

## 2015-03-11 ENCOUNTER — Encounter (HOSPITAL_COMMUNITY): Payer: Self-pay | Admitting: Psychology

## 2015-03-11 DIAGNOSIS — F1020 Alcohol dependence, uncomplicated: Secondary | ICD-10-CM

## 2015-03-11 DIAGNOSIS — F331 Major depressive disorder, recurrent, moderate: Secondary | ICD-10-CM

## 2015-03-11 DIAGNOSIS — Z8719 Personal history of other diseases of the digestive system: Secondary | ICD-10-CM

## 2015-03-12 LAB — PRESCRIPTION ABUSE MONITORING 17P, URINE
6-ACETYLMORPHINE, URINE: NEGATIVE ng/mL
AMPHETAMINE SCREEN URINE: NEGATIVE ng/mL
BARBITURATE SCREEN URINE: NEGATIVE ng/mL
BENZODIAZEPINE SCREEN, URINE: NEGATIVE ng/mL
BUPRENORPHINE, URINE: NEGATIVE ng/mL
CANNABINOIDS UR QL SCN: NEGATIVE ng/mL
COCAINE(METAB.)SCREEN, URINE: NEGATIVE ng/mL
Carisoprodol/Meprobamate, Ur: NEGATIVE ng/mL
Creatinine(Crt), U: 116.5 mg/dL (ref 20.0–300.0)
EDDP, Urine: NEGATIVE ng/mL
Fentanyl, Urine: NEGATIVE pg/mL
MDMA SCREEN, URINE: NEGATIVE ng/mL
METHADONE SCREEN, URINE: NEGATIVE ng/mL
Meperidine Screen, Urine: NEGATIVE ng/mL
Nitrite Urine, Quantitative: NEGATIVE ug/mL
OPIATE SCREEN URINE: NEGATIVE ng/mL
OXYCODONE+OXYMORPHONE UR QL SCN: NEGATIVE ng/mL
PH UR, DRUG SCRN: 6.2 (ref 4.5–8.9)
PHENCYCLIDINE QUANTITATIVE URINE: NEGATIVE ng/mL
PROPOXYPHENE SCREEN URINE: NEGATIVE ng/mL
SPECIFIC GRAVITY: 1.023
TAPENTADOL, URINE: NEGATIVE ng/mL
Tramadol Screen, Urine: NEGATIVE ng/mL

## 2015-03-12 LAB — ETHYL GLUCURONIDE, URINE: ETHYL GLUCURONIDE SCREEN, UR: NEGATIVE ng/mL

## 2015-03-13 ENCOUNTER — Encounter (HOSPITAL_COMMUNITY): Payer: Self-pay | Admitting: Psychology

## 2015-03-13 NOTE — Progress Notes (Signed)
Ian [name] is a 35 y.o. male patient. CD-IOP Individual Counseling Session: Counselor met with client for 50 min individual counseling session. Patient presented as active and engaged in session. He has slight tearfulness throughout session due to feelings of shame for his alcohol addiction and being "a poor father" to his 81 year old son. Counselor used open questions to allow client to explore his challenges during recovery. Client reported that he is happy to have a job and a busy schedule, but he is still worried he will return to using alcohol. Counselor and client explored the client's "desire to drink again one day" since alcohol is a significant part of his cultural and family identity. Counselor reflected that perhaps the client has yet to discover who he is "outside of alcohol". Client replied that he had a one year period of sobriety but that he eventually returned to use. Counselor and client spent large part of the session discussing the client's relationship to his son, his son's school performance, and the client's lack of engagement in son's academic life. Counselor reflected that client is able to have a strong relationship with son's recreational activities (go-kart racing), but that the client desires more input in his son's academic life. Counselor reflected the client's tearfulness, noting the significant emotional pain it causes him. Counselor tried to help client reframe his negative core beliefs and tendency to "visit the past" too often. Patient continues to build social support through his daily AA attendance, but has yet to secure a temporary sponsor. Patient also reported that he had purchased a wall-mounted calendar to help him stay more organized. The patient responded well to this intervention and his sobriety date remains 1/23. Youlanda Roys, Counseling Intern)       Brandon Melnick, LCAS

## 2015-03-13 NOTE — Progress Notes (Signed)
    Daily Group Progress Note  Program: CD-IOP   Group Time: 1-2:30 pm  Participation Level: Active  Behavioral Response: Appropriate and Sharing  Type of Therapy: Process Group  Topic: Process: the first part of group was spent in process. Members shared about current issues and concerns in early recovery. They were invited to disclose any thoughts or cravings that have appeared. The importance of sharing one's thoughts and feelings was emphasized. A drug test was collected from the newest group member.  Group Time: 2:45- 4pm  Participation Level: Active  Behavioral Response: Sharing  Type of Therapy: Psycho-education Group  Topic: Psycho-Ed; Resentments: the second half of group was spent in a psycho-ed on the topic of 'Resentments". The definition of resentment was provided and members were provided a handout. The group read the handout together and stopped to discuss sentences that were particularly powerful. Some members were willing to share a resentment they had and the ABC's of cognitive behavioral therapy were used to evaluate the logic and reasoning behind these resentments. There was good disclosure and openness among the group in this session.   Summary: The patient arrived about 10 minutes late, but he had told us of his work scheduled yesterday and we had agreed to excuse him for being tardy. During process, the patient reported he does have thoughts about using. He admitted that he is eating too much junk food and noted he needs to focus on the hungry and anger in the acronym, "HALT". The patient reported that he used to cook a lot, but he always drank when he cooked and it was too much of a trigger for him right now. The patient agreed that he had several resentments and he could identify with one of the perspectives listed on the handout: "I can't say what I really think". The patient reported his parents are always making cynical and cutting comments to him and it is very  painful and negative. We discussed whether it would be helpful to bring his parents in for a session and address this negative energy that their son is trying to conquer? He agreed that he and his counselor would discuss this in their individual session. The patient provided some information about AA meetings to another group member. He is always very inviting about meetings with his fellow group members and is more familiar than most with the AA meetings in the community. The patient responded well to this intervention and his sobriety date remains 1/23.   Family Program: Family present? No   Name of family member(s):   UDS collected: No Results:   AA/NA attended?: YesWednesday  Sponsor?: No, but he has been challenged to secure a temporary sponsor   Adonna Horsley, LCAS

## 2015-03-15 ENCOUNTER — Other Ambulatory Visit (HOSPITAL_COMMUNITY): Payer: BLUE CROSS/BLUE SHIELD | Admitting: Licensed Clinical Social Worker

## 2015-03-15 DIAGNOSIS — F1020 Alcohol dependence, uncomplicated: Secondary | ICD-10-CM | POA: Diagnosis not present

## 2015-03-17 ENCOUNTER — Encounter (HOSPITAL_COMMUNITY): Payer: Self-pay | Admitting: Licensed Clinical Social Worker

## 2015-03-17 ENCOUNTER — Encounter (HOSPITAL_COMMUNITY): Payer: Self-pay | Admitting: Psychology

## 2015-03-17 ENCOUNTER — Other Ambulatory Visit (HOSPITAL_COMMUNITY): Payer: BLUE CROSS/BLUE SHIELD

## 2015-03-17 NOTE — Progress Notes (Signed)
    Daily Group Progress Note  Program: CD-IOP   Group Time: 1-2:30  Participation Level: Active  Behavioral Response: Appropriate and Sharing  Type of Therapy: Process Group  Topic: The first part of group was spent in process where group members shared about obstacles, challenges and recovery-related activities since the last group. There was good disclosure and feedback among group members. Group members also shared any urges or cravings they had experienced. One group member reported she drank Saturday. The relapse was mapped out: trigger>thought>craving>use. One group member met with the program director, Deedra Ehrich, PA-C. Drug test results were returned. Two new members joined the group today and introduced themselves.    Group Time: 2:45-4  Participation Level: Active  Behavioral Response: Appropriate and Sharing  Type of Therapy: Psycho-education Group  Topic: The second part of group focused on mindfulness. A yoga instructor from [facility] taught chair yoga. One group member left at the end of yoga to go do a doctor'[name] appointment. While practicing yoga, the instructor asked the group to focus on being present. Some members of the group had practiced yoga previously and some were new to yoga. Everyone in the group was focused on mindfulness. Group members reported they liked the yoga and mindfulness session.      Summary:Pt came into group late due to new work schedule. Pt went to meetings Thursday, Friday, Saturday and Sunday nights. He still does not have a sponsor and is reluctant still to get one. He was invited to go out with AA friends to a bar to watch the [employer] game. He declined the invitation. He did not think he was strong enough in his sobriety to go to a bar. Pt participated in the yoga exercise and said he enjoyed it. His sobriety date is 1/23.       Family Program: Family present? No   Name of family member([name]):   UDS collected: No Results:    AA/NA  attended?: YesThursday, Friday, Saturday and Sunday  Sponsor?: No   Ian [name],Ian [name], Licensed Cli

## 2015-03-17 NOTE — Progress Notes (Signed)
    Daily Group Progress Note  Program: CD-IOP   Group Time: 1-2:30  Participation Level: Active  Behavioral Response: Appropriate and Sharing  Type of Therapy: Process Group  Topic: Counselors met with patients for group process session to discuss their recovery and any challenges or successes they had since we last met. Counselors particularly tried to focus on what patients had done "to enhance their recovery". Group members were active and engaged in discussion. Two patients met with program director to discuss medications.   Group Time: 2:45-4  Participation Level: Active  Behavioral Response: Appropriate and Sharing  Type of Therapy: Psycho-education Group  Topic: Counselors and a visiting [employer] Pharmacist met with patients to discuss addiction and mental health medication management and options. Patients listened intently and asked thoughtful questions about their own medication usage.   Summary: Patient arrived 15 min late due to an excused work conflict. He presented as engaged but also agitated in group. He reported that he has recently been frustrated by "the little things" throughout his day. He is having trouble holding onto resentments and anger. He has begun writing in a journal to "get it out" and not bottle up his emotions and thoughts, as he used to do. Patient is actively attending AA everyday but he has resisted getting a sponsor for unknown reasons. He continues to have shame and guilt from his addiction despite significant CBT work around his irrational beliefs. Counselor encouraged him to be open to getting a sponsor since he could begin to talk about some of his shame. Patient's sobriety date remains 1/23. Youlanda Roys, Counseling Intern)   Family Program: Family present? No   Name of family member(s):   UDS collected: No Results: negative  AA/NA attended?: [place] and Wednesday  Sponsor?: No   Hayle Parisi, LCAS

## 2015-03-18 ENCOUNTER — Other Ambulatory Visit (HOSPITAL_COMMUNITY): Payer: BLUE CROSS/BLUE SHIELD | Admitting: Psychology

## 2015-03-18 ENCOUNTER — Encounter (HOSPITAL_COMMUNITY): Payer: Self-pay | Admitting: Psychology

## 2015-03-18 DIAGNOSIS — F331 Major depressive disorder, recurrent, moderate: Secondary | ICD-10-CM

## 2015-03-18 DIAGNOSIS — F1020 Alcohol dependence, uncomplicated: Secondary | ICD-10-CM

## 2015-03-18 DIAGNOSIS — F411 Generalized anxiety disorder: Secondary | ICD-10-CM

## 2015-03-19 NOTE — Progress Notes (Signed)
Ian [name] is a 35 y.o. male patient. CD-IOP: Individual Counseling Session: Counselor met with patient for 45 min individual therapy session. Counselor and patient discussed patient's recovery. Counselor used open questions and Socratic Questioning to help patient dispute irrational beliefs. Patient presented as active, engaged, and verbose throughout session. When discussing his shame from his addiction he had watery eyes, though he did not access the tears as much as in previous sessions. The counselor reflected that the patient has a steady pattern of feeling guilty for borrowing money to help his addiction even though he wants to work on his addiction and borrowing money is the only way to do that. Counselor reflected that the patient has tremendous unresolved ambivalence about even small decisions throughout his day. Patient agreed that he likes to be "spontaneous and live in the moment". Patient agreed that he wanted to get a sponsor by the next group meeting on Monday. Counselor and client then discussed a significant struggle the patient had around allowing his parents to buy him new furniture. Counselor reflected that the patient has equal pros and cons about taking and not taking the gift, so essentially it does not matter whether he takes the furniture or not. Patient agreed with a chuckle and said that was a new insight. Patient discussed feeling nervous about ending CD-IOP in the coming weeks and expressed a desire to continue counseling with the counseling intern. Counselor agreed to work out a plan of action to help support him. Patient seems motivated to stay sober and highly interested in gaining insight into his triggering thoughts, actions, and feelings. Patient's sobriety date remains 1/23. Youlanda Roys, counseling intern).        Verne Cove, LCAS

## 2015-03-21 ENCOUNTER — Encounter (HOSPITAL_COMMUNITY): Payer: Self-pay | Admitting: Psychology

## 2015-03-21 NOTE — Progress Notes (Signed)
    Daily Group Progress Note  Program: CD-IOP   Group Time: 1-2:30 pm  Participation Level: Active  Behavioral Response: Sharing  Type of Therapy: Process Group  Topic: Process: the first part of group was spent in process. One member relapsed last night and the relapse was discussed at length. He had shared that he was struggling with cravings during the group session yesterday and many different recommendations and considerable encouragement had been offered. Another member was challenged about his own sobriety when he was sharing about his behaviors since we last met. He finally admitted he had drunk a 12-pack last night. His relapse was also examined at length and good ideas about reducing his job-stress were offered. There was good feedback and support offered among group members.   Group Time: 2:45- 4pm  Participation Level: Active  Behavioral Response: Appropriate and Sharing  Type of Therapy: Psycho-education Group  Topic: Psycho-Ed: Forgiveness. The second half of group was spent in a psycho-ed on forgiveness. Previous sessions had focused on resentments and this was the natural Segway into letting go of those resentments. Handouts were provided and members were instructed to write 3 different letters on resentment they are holding onto whether it is an individual, institution or group. The 3 topics were to include: one expressing one's feelings, the second a letter from the object of resentment saying everything you had wanted to hear and finally, the third is a brief acknowledgement of the second letter. Many members struggled with this assignment while others seemed relieved. Members were invited to share the specifics and to identify what the difficulties were in this assignment.    Summary: The patient arrived a little late, but he was excused because he had been at work and only got off at 1 pm. He checked-in with the same sobriety date and shared that he had attended 2 AA  meetings since group yesterday. He provided good feedback to the member who had relapsed and noted that they had spoken last night too. The patient reported that yesterday had been a "regular day" and explained that he had worked 8-5 and had experienced thoughts of using after he got off at 5 pm. Most of the other days he works, he goes in much earlier and gets off in early to mid-afternoon. Getting off at 5 ppm was like the old tape he had and it seemed natural to consider getting a beer or drink.  The patient was asked about a sponsor and he reminded his fellow group members that he had agreed to have one by next Monday. In the psycho-ed, the patient reported that the second letter was easy because, "it's everything I want to hear". The patient responded well to this intervention and is making good progress in his recovery. His sobriety date remains 1/23.   Family Program: Family present? No   Name of family member(s):   UDS collected: No Results:   AA/NA attended?: YesWednesday  Sponsor?: No, but he is being encouraged to secure one   Venice Liz, LCAS

## 2015-03-22 ENCOUNTER — Other Ambulatory Visit (HOSPITAL_COMMUNITY): Payer: BLUE CROSS/BLUE SHIELD | Admitting: Psychology

## 2015-03-22 ENCOUNTER — Other Ambulatory Visit (HOSPITAL_COMMUNITY): Payer: Self-pay | Admitting: Medical

## 2015-03-22 DIAGNOSIS — F1020 Alcohol dependence, uncomplicated: Secondary | ICD-10-CM

## 2015-03-22 DIAGNOSIS — F331 Major depressive disorder, recurrent, moderate: Secondary | ICD-10-CM

## 2015-03-22 DIAGNOSIS — F411 Generalized anxiety disorder: Secondary | ICD-10-CM

## 2015-03-24 ENCOUNTER — Other Ambulatory Visit (HOSPITAL_COMMUNITY): Payer: BLUE CROSS/BLUE SHIELD | Admitting: Psychology

## 2015-03-24 DIAGNOSIS — F1020 Alcohol dependence, uncomplicated: Secondary | ICD-10-CM | POA: Diagnosis not present

## 2015-03-24 DIAGNOSIS — F331 Major depressive disorder, recurrent, moderate: Secondary | ICD-10-CM

## 2015-03-24 LAB — PRESCRIPTION ABUSE MONITORING 17P, URINE
6-ACETYLMORPHINE, URINE: NEGATIVE ng/mL
AMPHETAMINE SCREEN URINE: NEGATIVE ng/mL
BARBITURATE SCREEN URINE: NEGATIVE ng/mL
BENZODIAZEPINE SCREEN, URINE: NEGATIVE ng/mL
Buprenorphine, Urine: NEGATIVE ng/mL
CANNABINOIDS UR QL SCN: NEGATIVE ng/mL
CARISOPRODOL/MEPROBAMATE, UR: NEGATIVE ng/mL
Cocaine (Metab) Scrn, Ur: NEGATIVE ng/mL
Creatinine(Crt), U: 124.7 mg/dL (ref 20.0–300.0)
EDDP, Urine: NEGATIVE ng/mL
Fentanyl, Urine: NEGATIVE pg/mL
MDMA SCREEN, URINE: NEGATIVE ng/mL
Meperidine Screen, Urine: NEGATIVE ng/mL
Methadone Screen, Urine: NEGATIVE ng/mL
Nitrite Urine, Quantitative: NEGATIVE ug/mL
OPIATE SCREEN URINE: NEGATIVE ng/mL
OXYCODONE+OXYMORPHONE UR QL SCN: NEGATIVE ng/mL
PHENCYCLIDINE QUANTITATIVE URINE: NEGATIVE ng/mL
PROPOXYPHENE SCREEN URINE: NEGATIVE ng/mL
Ph of Urine: 5.9 (ref 4.5–8.9)
SPECIFIC GRAVITY: 1.028
TAPENTADOL, URINE: NEGATIVE ng/mL
TRAMADOL SCREEN, URINE: NEGATIVE ng/mL

## 2015-03-24 LAB — ETHYL GLUCURONIDE, URINE: Ethyl Glucuronide Screen, Ur: NEGATIVE ng/mL

## 2015-03-25 ENCOUNTER — Other Ambulatory Visit (HOSPITAL_COMMUNITY): Payer: BLUE CROSS/BLUE SHIELD | Admitting: Psychology

## 2015-03-25 DIAGNOSIS — F1020 Alcohol dependence, uncomplicated: Secondary | ICD-10-CM

## 2015-03-25 DIAGNOSIS — F411 Generalized anxiety disorder: Secondary | ICD-10-CM

## 2015-03-25 DIAGNOSIS — F331 Major depressive disorder, recurrent, moderate: Secondary | ICD-10-CM

## 2015-03-27 ENCOUNTER — Encounter (HOSPITAL_COMMUNITY): Payer: Self-pay | Admitting: Psychology

## 2015-03-27 NOTE — Progress Notes (Signed)
    Daily Group Progress Note  Program: CD-IOP   Group Time: 1-2:30 pm  Participation Level: Active  Behavioral Response: Appropriate and Sharing  Type of Therapy: Process Group  Topic: Process: the first half of group was spent in process. Members shared about the past weekend. They identified what they had done to support their recovery and any challenges or struggles they may have experienced. One member checked-in with a new sobriety date. When questioned further, the patient admitted he had taken a klonipin both on Friday and Saturday. He had no explanation for the use nor the ease with which it was available to him in his home. Group members encouraged him, but also reminded him that if the pills are available, he will take them. A new group member was present and he shared about his struggles with alcohol and other drugs. Today, the program director met with several group members, including the newest group member.    Group Time: 2:45- 4pm  Participation Level: Active  Behavioral Response: Appropriate and Sharing  Type of Therapy: Psycho-education Group  Topic: Psycho-Ed: the second half of group was spent in a psycho-ed. The topic of this session was about the source or cause of addiction and was titled, Administrator, Civil Service, Psychological, Social and Spiritual". The presentation focused on what one can change. Members agreed that they cannot change their biological or genetic inheritance, but recognized they could change the other three elements. The presentation invited members to identify what they can do to change the other three elements. The discussion proved lively with good disclosure among group members. Random drug tests were collected.   Summary: The patient reported he had joined a gym and will meet with a trainer tomorrow to learn more about the machines and how he might plan his workouts. Since the group last met, the patient had attended 5 AA meetings. This was the most of  all group members and I applauded his efforts. When asked by another member if he had gotten a sponsor, the patient admitted he had not yet secured a sponsor, but he was thinking about a fellow and was planning on attending a meeting later this week where he can find him. The patient provided good feedback to his fellow group member and agreed with another that if he had klonipin in his house, he would be taking it. The patient reported going to the gym will address his social and psychological aspects of the listed elements in the psycho-ed. He reported he attends church, but admitted that he has recently stopped praying. The patient reported he has begun questioning things. He was encouraged to take time every morning to pray for a few minutes. The patient made some good comments and responded well to this intervention. His sobriety date remains 1/23.   Family Program: Family present? No   Name of family member(s):   UDS collected: Yes Results: negative  AA/NA attended?: YesThursday, Friday, Saturday and Sunday  Sponsor?: No   Jolinda Pinkstaff, LCAS

## 2015-03-27 NOTE — Progress Notes (Signed)
    Daily Group Progress Note  Program: CD-IOP   Group Time: 1-2:30 pm  Participation Level: Active  Behavioral Response: Sharing  Type of Therapy: Process Group  Topic: Process: the first half of group was spent in process. Two members had been discharged since the last group session and the members were clearly disheartened by the news. Process included discussing what one had done to support his/her recovery since the last session. Also discussed were challenges or temptations, including any resulting thoughts or cravings. Drug tests were collected from 3 group members.  Group Time: 2:45- 4pm  Participation Level: Active  Behavioral Response: Appropriate and Sharing  Type of Therapy: Psycho-education Group  Topic: "The Serenity Prayer": the second half of group was spent in a psycho-ed. Members were provided with a handout on the Serenity Prayer. Group members were asked to identify 5 things they could not change and 5 things they could change. Members shared their findings and a discussion ensued about how to address these issues. Despite the inability to change the past, members all identified the 'past' as being something they think about. During group, the Investment banker, operational met with a member who was graduating tomorrow and at least 3 others about med management.    Summary: The patient arrived late, but was excused since he could not get off work before 2 pm. He expressed frustration that he had to stay when they weren't busy and there was really nothing for him to do. The patient reported he had attended 2 AA meetings since we last met. He still doesn't have a sponsor, but maintained that he is looking. In the psycho-ed, the patient was engaged and identified the things he couldn't change as including not being able to change people or the fact that he has this disease. He can change by assuming responsibility for the things that he has not completed or followed through on. The  patient continues to provide good feedback and is making progress in early recovery. He responded well to this intervention and his sobriety date remains 1/23.   Family Program: Family present? No   Name of family member(s):   UDS collected: No Results:  AA/NA attended?: [place] and Tuesday  Sponsor?: No   Duval Macleod, LCAS

## 2015-03-29 ENCOUNTER — Other Ambulatory Visit (HOSPITAL_COMMUNITY): Payer: BLUE CROSS/BLUE SHIELD

## 2015-03-29 ENCOUNTER — Encounter (HOSPITAL_COMMUNITY): Payer: Self-pay | Admitting: Psychology

## 2015-03-29 NOTE — Progress Notes (Signed)
    Daily Group Progress Note  Program: CD-IOP   Group Time: 1-2:30 pm  Participation Level: Active  Behavioral Response: Sharing  Type of Therapy: Process Group  Topic: Process: the first half of group was spent in process. Members shared about current issues and concerns in early recovery. They were invited to discuss any thoughts or cravings and how they addressed them. A new group member was present today and he introduced himself in this part of group. A drug test was collected from this newest member.   Group Time: 2:45-4 pm  Participation Level: Active  Behavioral Response: Appropriate and Sharing  Type of Therapy: Psycho-education Group  Topic: Psycho-Ed: The Progression of Addiction/Graduation: the second part of group was spent in a psycho-ed. The topic was the progression of the disease of Addiction and included a handout with the famous Jellineck Chart. Members were provided with a blank sheet of paper and asked to chart the progression of their own addiction. After about 10 minutes of drawing, members were asked to show their chart and explain the 'highlights' of the use. As the session neared the end, two guests entered the group room to be present for the graduation ceremony. The graduate's wife and sponsor had come to be here as he successfully graduated from the program. The ceremony was held with kind words shared between members and the graduating one.   Summary: The patient reported he had attended 1 AA meeting last night. In response to another member's comments about God, he reported he is also struggling with the whole concept of one's Higher Power. The patient admitted he is not praying right now. He referred to "[facility]", which he explained is only praying when something is wrong. When questioned about this further, the patient couldn't really explain why this is. Another member encouraged him to begin starting the day or ending the night with a brief  prayer. The patient noted that as a young child he was 'scared of dying' and these underlying fears have been present since then. The patient admitted he wasn't sure how this all fit in together. The session concluded before this patient could share his drawing, but he was assured the time would be available on Monday. The patient shared kind words with the graduating member and agreed that they would see each other in the rooms. This patient has been doing well in his recovery and remains sober. His sobriety date is 1/23.   Family Program: Family present? No   Name of family member(s):   Drug Test collected: No  AA/NA attended?: YesWednesday  Sponsor?: No   Ebba Goll, LCAS

## 2015-03-31 ENCOUNTER — Other Ambulatory Visit (HOSPITAL_COMMUNITY): Payer: BLUE CROSS/BLUE SHIELD | Admitting: Psychology

## 2015-03-31 DIAGNOSIS — F331 Major depressive disorder, recurrent, moderate: Secondary | ICD-10-CM

## 2015-03-31 DIAGNOSIS — F1020 Alcohol dependence, uncomplicated: Secondary | ICD-10-CM

## 2015-03-31 DIAGNOSIS — F411 Generalized anxiety disorder: Secondary | ICD-10-CM

## 2015-04-01 ENCOUNTER — Other Ambulatory Visit (HOSPITAL_COMMUNITY): Payer: BLUE CROSS/BLUE SHIELD | Admitting: Psychology

## 2015-04-01 DIAGNOSIS — F331 Major depressive disorder, recurrent, moderate: Secondary | ICD-10-CM

## 2015-04-01 DIAGNOSIS — F1020 Alcohol dependence, uncomplicated: Secondary | ICD-10-CM

## 2015-04-01 DIAGNOSIS — F411 Generalized anxiety disorder: Secondary | ICD-10-CM

## 2015-04-02 ENCOUNTER — Encounter (HOSPITAL_COMMUNITY): Payer: Self-pay | Admitting: Psychology

## 2015-04-02 NOTE — Progress Notes (Signed)
    Daily Group Progress Note  Program: CD-IOP   Group Time: 1-2:30 pm  Participation Level: Active  Behavioral Response: Sharing  Type of Therapy: Process Group  Topic: Process; the first part of group was spent in process. Members shared about what they had done since we last met to support their recovery. One member had returned to use since the last group session and his slip was processed by the group. Members provided honest feedback and shared openly about their struggles.   Group Time: 2:45- 4pm  Participation Level: Active  Behavioral Response: Sharing  Type of Therapy: Psycho-education Group  Topic: Psycho-Ed: Visit with the Chaplain. The second half of group was spent in a psycho-ed with a visiting chaplain. He spoke about the benefits of mindfulness and led the group in a brief guided relaxation exercise. Upon completion of this exercise another activity was led by the chaplain. Members were asked to draw their face and the way they hope to present to the outer world. The group members then showed their drawing to their fellow group members while members offered feedback. The way they present and the way that presentation is interpreted by others, was discussed at length. The session proved revealing and informative.   Summary: The patient arrived a little late today, but his arrival had been anticipated with his report about his work schedule. The patient reported he had attended a meeting on Monday evening after group, but had not attended a meeting on Tuesday. He admitted he was tired after work and wanted to spend time with his son. The patient admitted it had been a 'hard 2 days' and explained that he couldn't let go of some of the thoughts brought up in Monday's group session. The patient admitted he gets very frustrated at work because they aren't at all logical or consistent and could easily save money by managing their staff better. During the psycho-ed, the patient  drew a face that his fellow members described as ready, vacant and pulling his hair out. The patient remains sober and has done well in early recovery, but he has admitted to being anxious about graduating and losing the accountability and support he has here in the program. He made some good comments and responded well to this intervention. His sobriety date remains 1/23.   Family Program: Family present? No   Name of family member(s):   UDS collected: No Results:  AA/NA attended?: [place]  Sponsor?: No   Ian [name], LCAS

## 2015-04-04 ENCOUNTER — Encounter (HOSPITAL_COMMUNITY): Payer: Self-pay | Admitting: Psychology

## 2015-04-04 NOTE — Progress Notes (Signed)
    Daily Group Progress Note  Program: CD-IOP   Group Time: 1-2:30 pm  Participation Level: Active  Behavioral Response: Appropriate and Sharing  Type of Therapy: Process Group  Topic: Process: the first part of group was spent in process. Members discussed the struggles they face in early recovery and other issues that have proven challenging for them. A number of group members shared that they had really struggled since the session yesterday with the Chaplain. One member admitted he was very upset when he left and really just wanted to break down and cry. This was processed and he was applauded because he did not drink last night. There was good feedback and discussion among group members.   Group Time: 2:45- 4pm Participation Level: Active  Behavioral Response: Sharing  Type of Therapy: Psycho-education Group  Topic: Psycho-ED/Graduation: the second half of group was spent in a psycho-ed identifying the Family Roles in an addicted or dysfunctional family system.  A handout was provided identifying the 5 most identified roles and group members took turns reading these descriptions and discussing them. Members identified what roles they might have taken on in their childhood and if they are continuing to carry on these roles. The psycho-ed ended with about 20 minutes remaining in the session. A graduation was held honoring a member who was successfully completing and graduating from the program today. The ceremony, coin passing and brownies were completed with kind words of encouragement and gratitude shared among members.   Summary: The patient reported he had not attended any meetings since yesterday afternoon. He had had his son and felt like it was important to spend time with him when he wasn't at work. The patient expressed frustration with his situation and the struggles he has when his parents are out of town. His mother sends constant texts checking up on them to the point that  he doesn't want to even answer them. He can't drive with his son in the car, but it is also important for them to be spending more time at his apartment and not just his parent's home. He is not pleased with this situation, but when his counselor asked him to describe what the 'ideal scenario' would look like, the patient could not identify it. It will be difficult to work towards something if he doesn't know what that is. The patient reported that the family role he took on the most was the scapegoat. He has been in trouble ever since his late teens. The question remains whether he can take on new more positive and validating roles? The patient shared kind words of gratitude with the graduating member. They have the same sobriety dates and has kept them now for 60 days as of today. His check-in, the patient also received a round of applause from the group. The patient was engaged and active in group and he responded well to this intervention. His sobriety date remains 1/23.   Family Program: Family present? No   Name of family member(s):   UDS collected: No Results:   AA/NA attended?: No  Sponsor?: No   Dezarae Mcclaran, LCAS

## 2015-04-05 ENCOUNTER — Other Ambulatory Visit (HOSPITAL_COMMUNITY): Payer: BLUE CROSS/BLUE SHIELD

## 2015-04-07 ENCOUNTER — Other Ambulatory Visit (HOSPITAL_COMMUNITY): Payer: BLUE CROSS/BLUE SHIELD | Admitting: Psychology

## 2015-04-07 DIAGNOSIS — F1020 Alcohol dependence, uncomplicated: Secondary | ICD-10-CM

## 2015-04-09 ENCOUNTER — Encounter (HOSPITAL_COMMUNITY): Payer: Self-pay | Admitting: Psychology

## 2015-04-09 ENCOUNTER — Other Ambulatory Visit (HOSPITAL_COMMUNITY): Payer: Self-pay | Admitting: Psychiatry

## 2015-04-09 DIAGNOSIS — F1020 Alcohol dependence, uncomplicated: Secondary | ICD-10-CM

## 2015-04-09 NOTE — Progress Notes (Signed)
    Daily Group Progress Note  Program: CD-IOP   Group Time: 1-2:30   Participation Level: Active  Behavioral Response: Appropriate and Sharing  Type of Therapy: Process Group  Topic: Counselors met with patients for a 1.5 hour group process session. Patients were active and engaged in session. Counselor led group in discussing their thoughts, feelings, and behaviors related to their addiction and recovery. Program director met with some patients to discuss treatment and medications.      Group Time: 2:45-4  Participation Level: Active  Behavioral Response: Appropriate and Sharing  Type of Therapy: Psycho-education Group  Topic: Counselors led a psycho-ed session discussing "high risk, low risk, and no risk" situations. Members discussed triggering situations and behaviors that lead them to use mind-altering drugs and alcohol. Counselors helped to link group members' experiences.    Summary: Patient is making good progress in his recovery. He presented as upbeat and engaged in group. He reported having good sleep and attending 5 AA meetings since our last group. He is working out regularly at [employer] and finding time to relax. His job causes him stress due to the schedule but not the nature of the work. Patient has discussed his refusal to make decisions which leads him to live "in chaos". Counselor tried to challenge patient's indecisiveness by inquiring about getting a home group or sponsor. Patient does not have either of those but hopes to have a home group before he graduates next week.  Youlanda Roys, Intern   Family Program: Family present? No   Name of family member(s):   UDS collected: No Results:   AA/NA attended?: YesMonday, Tuesday and Wednesday  Sponsor?: No   Marv Alfrey, LCAS

## 2015-04-09 NOTE — Telephone Encounter (Signed)
Ian [name] - CDIOP counselor- came to me, patient had taken the last of his Campral and Leonette MostCharles was not available to refill. I spoke to Dr. Donell BeersPlovsky and he approved a one month order to be sent to the patients pharmacy. [facility] supply of Campral 333 mg 2 po bid # 180 x [phone]. I called the patient and let him know the prescription had been sent.

## 2015-04-12 ENCOUNTER — Other Ambulatory Visit (HOSPITAL_COMMUNITY): Payer: BLUE CROSS/BLUE SHIELD | Attending: Medical | Admitting: Psychology

## 2015-04-12 DIAGNOSIS — F411 Generalized anxiety disorder: Secondary | ICD-10-CM

## 2015-04-12 DIAGNOSIS — F331 Major depressive disorder, recurrent, moderate: Secondary | ICD-10-CM

## 2015-04-12 DIAGNOSIS — F1020 Alcohol dependence, uncomplicated: Secondary | ICD-10-CM

## 2015-04-13 ENCOUNTER — Encounter (HOSPITAL_COMMUNITY): Payer: Self-pay | Admitting: Psychology

## 2015-04-13 NOTE — Progress Notes (Signed)
Daily Group Progress Note  Program: CD-IOP   Group Time: 1-2:30 pm  Participation Level: Active  Behavioral Response: Appropriate and Sharing  Type of Therapy: Process Group  Topic: Process: the first half of group was spent in process. Members shared about the past weekend and disclosed what they had done to support and strengthen their recovery. One member admitted that she had drunk on Friday evening and the details of that use were discussed. A new member was present and he introduced himself to the group. During the session today, the program director met with 2 members re: med checks and met with the new group member. Random drug tests were collected from 3 group members.  Group Time: 2:45- 4pm  Participation Level: Active  Behavioral Response: Sharing  Type of Therapy: Psycho-education Group  Topic: Psycho-Ed: Jenga; Feelings from your Childhood. The second half of group was spent in a psycho-ed. Members gathered around a table in the center of the room where a [place] game had been set up. This therapeutic intervention involved members taking turns pulling out one of the blocks, reading the feeling word on it and including it in a memory of their childhood.  Most of the memories were not friendly or fond ones. As the session neared the end, group members reported they had really enjoyed the intervention and it had made them think and brought back forgotten memories of the past. Two members were absent from group today, but their absences had been excused.   Summary: The patient arrived with a sunburned face. We wondered if he had been on the golf course or by the pool, but he explained he had been at the race up in [place], [place] on Sunday and had forgotten a hat or sun screen. He reported a busy weekend with work, spending time with his son and attending 4 AA meetings. He admitted that at one point, he had thought about drinking, but thought about the consequences and quickly  distracted himself. The patient shared about the constant criticism from his mother. She isn't openely critical of her son, but her comments always have a bite or some reminder that includes the many problems from his drinking. He described her as suffering from OCD and not necessarily meaning to aggravate him, "but it does". The patient also noted that his 11 yo son also feels her nagging. The patient reported he sometimes realizes he is comparing himself to others, but then noted that his parents have always done that. He was taught to do this through the social learning modeled by his parents. The patient was engaged and disclosed openly during the game with [place]. He talked his disappointments in his teen years. Prior to his mother leaving his father, the patient admitted he had no clue there were any problems. She left their home on his birthday and this has always been painful for him. Other group members were very saddened to hear that she had chosen that day to leave. His father has had little to do with his son or daughter over the years since the patient's mother divorced him and he remarried. The pain in the patient's voice as he recounted the lack of contact was very apparent. The patient continues to do work in revealing the pain of his teen years and the ways those events have continued to shape his thinking and behaviors even now. He responded well to this intervention and his sobriety date remains 1/23.    Family Program: Family  present? No   Name of family member(s):   UDS collected: No Results:   AA/NA attended?: YesThursday, Friday, Sunday, Monday  Sponsor?: No and he continues to resist this recommendation by counselor   Brandon Melnick, LCAS

## 2015-04-14 ENCOUNTER — Telehealth (HOSPITAL_COMMUNITY): Payer: Self-pay | Admitting: Psychology

## 2015-04-14 ENCOUNTER — Other Ambulatory Visit (HOSPITAL_COMMUNITY): Payer: BLUE CROSS/BLUE SHIELD

## 2015-04-14 ENCOUNTER — Encounter (HOSPITAL_COMMUNITY): Payer: Self-pay | Admitting: Psychology

## 2015-04-14 NOTE — Progress Notes (Signed)
Ian [name] is a 35 y.o. male patient. CD-IOP: Excused Absence. The patient was not in group today, but he is excuses as he had to work today. He is recently employed and does not want to do anything to jeopardize his job. He is excused from group today.         Ganesh Deeg, LCAS

## 2015-04-16 ENCOUNTER — Other Ambulatory Visit (HOSPITAL_COMMUNITY): Payer: Self-pay | Admitting: Psychiatry

## 2015-04-19 ENCOUNTER — Encounter (HOSPITAL_COMMUNITY): Payer: Self-pay

## 2015-04-19 ENCOUNTER — Telehealth (HOSPITAL_COMMUNITY): Payer: Self-pay

## 2015-04-19 ENCOUNTER — Encounter (HOSPITAL_COMMUNITY): Payer: Self-pay | Admitting: Psychology

## 2015-04-19 ENCOUNTER — Other Ambulatory Visit (HOSPITAL_COMMUNITY): Payer: Self-pay | Admitting: Medical

## 2015-04-19 ENCOUNTER — Telehealth (HOSPITAL_COMMUNITY): Payer: Self-pay | Admitting: Psychology

## 2015-04-19 DIAGNOSIS — F10282 Alcohol dependence with alcohol-induced sleep disorder: Secondary | ICD-10-CM

## 2015-04-19 MED ORDER — NALTREXONE HCL 50 MG PO TABS: 50.0000 mg | ORAL_TABLET | Freq: Every day | ORAL | Status: DC

## 2015-04-19 MED ORDER — ACAMPROSATE CALCIUM 333 MG PO TBEC: 666.0000 mg | DELAYED_RELEASE_TABLET | Freq: Three times a day (TID) | ORAL | Status: DC

## 2015-04-19 NOTE — Telephone Encounter (Signed)
rx sent to pharmacy

## 2015-04-19 NOTE — Progress Notes (Signed)
Ian PlowmanJonathan N [name] is a 35 y.o. male patient. CD-IOP: Patient phoned and left message stating that he wouldn't be in group today. He apologized for missing, but reported that he was put back on the schedule for work today. He will be in group on Wednesday. Will excuse from group today.         Khaled Herda, LCAS

## 2015-04-19 NOTE — Telephone Encounter (Signed)
Fax received from patients pharmacy for clarification on prescription of Naltrexone - it is written for 50 mg 1/2 tablet a day, but patient states he takes one a day. Please review and advise, thank you

## 2015-04-21 ENCOUNTER — Encounter (HOSPITAL_COMMUNITY): Payer: Self-pay | Admitting: Medical

## 2015-04-21 ENCOUNTER — Other Ambulatory Visit (HOSPITAL_COMMUNITY): Payer: BLUE CROSS/BLUE SHIELD | Admitting: Psychology

## 2015-04-21 DIAGNOSIS — F1994 Other psychoactive substance use, unspecified with psychoactive substance-induced mood disorder: Secondary | ICD-10-CM

## 2015-04-21 DIAGNOSIS — F10282 Alcohol dependence with alcohol-induced sleep disorder: Secondary | ICD-10-CM

## 2015-04-21 DIAGNOSIS — K860 Alcohol-induced chronic pancreatitis: Secondary | ICD-10-CM

## 2015-04-21 DIAGNOSIS — F1020 Alcohol dependence, uncomplicated: Secondary | ICD-10-CM

## 2015-04-21 MED ORDER — SULFAMETHOXAZOLE-TRIMETHOPRIM 800-160 MG PO TABS: 1.0000 | ORAL_TABLET | Freq: Two times a day (BID) | ORAL | Status: DC

## 2015-04-21 MED ORDER — TRAZODONE HCL 100 MG PO TABS: ORAL_TABLET | ORAL | Status: DC

## 2015-04-21 MED ORDER — HYDROXYZINE PAMOATE 50 MG PO CAPS: 50.0000 mg | ORAL_CAPSULE | Freq: Four times a day (QID) | ORAL | Status: DC | PRN

## 2015-04-21 MED ORDER — PROPRANOLOL HCL 20 MG PO TABS: ORAL_TABLET | ORAL | Status: DC

## 2015-04-21 NOTE — Progress Notes (Signed)
  [facility] Discharge Summary   Ian [name] [id number]  Date of Admission: 02/01/2015 Date of Discharge: 04/22/2015  Course of Treatment: Pt has done well in treatment and will complete as successful.He has been able to maintain abstinence for the longest period in recent times. He has met the requirements of meetings;group and individual counseling sessions and is to meet for sponsorship today. He has found employment .He has found Naltrexone and Campral extremely helpful for cravings.  Goals and Activities to Help Maintain Sobriety: 1. Stay away from old friends who continue to drink and use mind-altering chemicals. 2. Continue practicing Fair Fighting rules in interpersonal conflicts. 3. Continue alcohol and drug refusal skills and call on support systems. 4.   Continue Medications  Referrals: Pending match to appropriate clinicians   Aftercare services: [facility] OP Tuesdays at 5:00 pm 1. Attend AA/NA meetings 4 times per week.minimum.Recommend 90 meetings in 90 days initially 2. Obtain a sponsor and a home group in [place]. 3. Referrals pending 4. 3 month RX medications provided Next appointment: Aftercare  Plan of Action to Address Continuing Problems:as above    Client has participated in the development of this discharge plan and has received a copy of this completed plan  Darlyne Russian  04/21/2015   Darlyne Russian, PA-C 04/21/2015

## 2015-04-22 ENCOUNTER — Other Ambulatory Visit (HOSPITAL_COMMUNITY): Payer: BLUE CROSS/BLUE SHIELD | Admitting: Psychology

## 2015-04-22 DIAGNOSIS — F411 Generalized anxiety disorder: Secondary | ICD-10-CM

## 2015-04-22 DIAGNOSIS — F1020 Alcohol dependence, uncomplicated: Secondary | ICD-10-CM

## 2015-04-22 DIAGNOSIS — F331 Major depressive disorder, recurrent, moderate: Secondary | ICD-10-CM

## 2015-04-22 NOTE — Progress Notes (Signed)
    Daily Group Progress Note  Program: CD-IOP   Group Time: 1-2:30  Participation Level: Active  Behavioral Response: Appropriate and Sharing  Type of Therapy: Process Group  Topic:Counselor and Counseling intern led a group session with an emphasis on group process. Patients shared about challenges and successes related to their recovery and addiction. One new patient was present today and met with the Investment banker, operational. Another patient arrived 15 minutes late and was visibly drowsy and talking slowly. When confronted patient claimed that he was taking muscle relaxers due to allergies. Counselor escorted him out of the room to be checked by the Investment banker, operational. Random urine samples were taken from 3 patients today.   Group Time: 2:45-4  Participation Level: Active  Behavioral Response: Appropriate and Sharing  Type of Therapy: Psycho-education Group  Topic: Counselor and Counseling intern led a group session with an emphasis on psychoeducation. One member was present for only the psychoeducation session due to a prior conflict. Counselors allowed significant time for patients to process the triggering and "uncomfortable" feelings from the earlier process session. Counselor used open questions to help patients discuss their experience of "confronting addiction with assertiveness". The counselor helped to frame the situation as a growing and learning experience.     Summary:  Patient arrived on time and presented as active and engaged to group today. He was well groomed, made strong eye contact, and seemed very comfortable sharing with the group about his upcoming graduation tomorrow. Patient has made good progress in his early recovery. He reported on attending the gym regularly, attending at least 1 AA meeting per day. When asked about graduation, he appeared sad and at a loss for words. Patient reported that he has nearly secured a temporary sponsor which has been an ongoing  struggle throughout his treatment. Counselor reminded patient that he needs to secure a PCD or psychiatrist to manage his medications after leaving. He spoke about his tendency to get aggravated easily and a desire to continue working on his coping skills and exploring his family of origin story. Patient's sobriety date is 1/23.   Family Program: Family present? No   Name of family member(s):   UDS collected: No Results:  AA/NA attended?: YesTuesday and Wednesday  Sponsor?: No   Keoshia Steinmetz, LCAS

## 2015-04-26 ENCOUNTER — Encounter (HOSPITAL_COMMUNITY): Payer: Self-pay | Admitting: Psychology

## 2015-04-26 ENCOUNTER — Other Ambulatory Visit (HOSPITAL_COMMUNITY): Payer: BLUE CROSS/BLUE SHIELD

## 2015-04-26 ENCOUNTER — Other Ambulatory Visit (HOSPITAL_COMMUNITY): Payer: Self-pay | Admitting: Medical

## 2015-04-26 NOTE — Progress Notes (Signed)
    Daily Group Progress Note  Program: CD-IOP   Group Time: 1-2:30 pm  Participation Level: Active  Behavioral Response: Appropriate and Sharing  Type of Therapy: Process Group  Topic: Process: the first half of group was spent in process. At least 2 group members had been discharged since the last group session yesterday and members wanted to discuss this. They shared their feelings about the absence of the members and their concerns about their well-beings. Members were asked to share what they were experiencing relative to their own recovery and the ongoing threats and challenges in early recovery.   Group Time: 2:45- 4pm  Participation Level: Active  Behavioral Response: Appropriate and Sharing  Type of Therapy: Psycho-education Group/Graduation  Topic: Family Sculpture/Graduation: the second half of group was spent in a psycho-ed on [employer] WholesaleFamily Sculpture. Several members had already completed their own family sculptures, but this experiential session was introduced for the new group members. A member volunteered to 'sculpt' his family and proceeded to sculpt a boy alone at home with a father who was at the far corner of the room. It was a powerful image as he shared the pain and senses of abandonment he had experienced from a very early age. Nera the end of the session, a graduation ceremony was held honoring a member who was successfully graduating today from the program. Kind and validating words were shared with the member who was leaving today.   Summary: The patient appeared for group a few minutes late. He had been required to stay at work until 1 pm, so he headed here directly when he got off. He reported he had been working for much of the time this past week, but he did attend a meeting and went to the gym since yesterday. He noted that his sister and her family are coming into town and it will be fun, but hectic with her 3 young boys in the house. The patient reported he is  scheduled to meet with a fellow who has significant clean time and is very motivating. He is hoping he will be his sponsor. The patient provided honest feedback to his fellow member during the sculpture and was the one who noted his childhood must have contributed to his independence. This patient was the one who was graduating today. He admitted that he really didn't want to leave and enjoyed the group sessions and the accountability that comes with this program. He reported, though, that it is time for him to move on and he was very grateful for what he had learned from everyone and the support he had received from the team. He was encouraged to remain confident, but not to be afraid to reach out and ask for help. The patient leaves us having remained sober since entering the program and has proven to be a valuable and respected group member. His sobriety date remains 1/23.   Family Program: Family present? No   Name of family member(s):   UDS collected: No Results:   AA/NA attended?: No  Sponsor?: No   Tanuj Mullens, LCAS

## 2015-04-27 ENCOUNTER — Ambulatory Visit (INDEPENDENT_AMBULATORY_CARE_PROVIDER_SITE_OTHER): Payer: BLUE CROSS/BLUE SHIELD | Admitting: Licensed Clinical Social Worker

## 2015-04-27 DIAGNOSIS — F1020 Alcohol dependence, uncomplicated: Secondary | ICD-10-CM

## 2015-04-28 ENCOUNTER — Encounter (HOSPITAL_COMMUNITY): Payer: Self-pay | Admitting: Licensed Clinical Social Worker

## 2015-04-28 ENCOUNTER — Other Ambulatory Visit (HOSPITAL_COMMUNITY): Payer: BLUE CROSS/BLUE SHIELD

## 2015-04-28 NOTE — Progress Notes (Signed)
Ian PlowmanJonathan N [name] is a 35 y.o. male patient. Aftercare Group Note: 60 mins. Pt's first group with aftercare program. He recently completed CD-IOP. Pt is working a recovery program: meetings 1-2x per day, taking medication as directed, reading big book. Pt still has not gotten a sponsor. He will need to continue to work on finding one and begin working the steps. Pt introduced himself to the group and gave some hx of his alcoholism. He appeared open and honest in his sharing. Topic: Phases of Recovery: Part 1. General discussion of 4 phases of recovery. And how everyone's recovery plan looks different, it is a process, not an event. Pt was active in the discussion. Will continue discussion of phases of recovery next week.        Tywana Robotham S, Licensed Cli

## 2015-04-29 ENCOUNTER — Other Ambulatory Visit (HOSPITAL_COMMUNITY): Payer: BLUE CROSS/BLUE SHIELD

## 2015-05-03 ENCOUNTER — Other Ambulatory Visit (HOSPITAL_COMMUNITY): Payer: BLUE CROSS/BLUE SHIELD

## 2015-05-04 ENCOUNTER — Ambulatory Visit (HOSPITAL_COMMUNITY): Payer: BLUE CROSS/BLUE SHIELD | Admitting: Licensed Clinical Social Worker

## 2015-05-05 ENCOUNTER — Other Ambulatory Visit (HOSPITAL_COMMUNITY): Payer: BLUE CROSS/BLUE SHIELD

## 2015-05-06 ENCOUNTER — Other Ambulatory Visit (HOSPITAL_COMMUNITY): Payer: BLUE CROSS/BLUE SHIELD

## 2015-05-10 ENCOUNTER — Telehealth (HOSPITAL_COMMUNITY): Payer: Self-pay | Admitting: Licensed Clinical Social Worker

## 2015-05-10 ENCOUNTER — Other Ambulatory Visit (HOSPITAL_COMMUNITY): Payer: BLUE CROSS/BLUE SHIELD

## 2015-05-11 ENCOUNTER — Ambulatory Visit (HOSPITAL_COMMUNITY): Payer: BLUE CROSS/BLUE SHIELD | Admitting: Licensed Clinical Social Worker

## 2015-05-12 ENCOUNTER — Other Ambulatory Visit (HOSPITAL_COMMUNITY): Payer: BLUE CROSS/BLUE SHIELD

## 2015-05-13 ENCOUNTER — Other Ambulatory Visit (HOSPITAL_COMMUNITY): Payer: BLUE CROSS/BLUE SHIELD

## 2015-05-17 ENCOUNTER — Other Ambulatory Visit (HOSPITAL_COMMUNITY): Payer: BLUE CROSS/BLUE SHIELD

## 2015-05-18 ENCOUNTER — Encounter (HOSPITAL_COMMUNITY): Payer: Self-pay | Admitting: Psychology

## 2015-05-18 NOTE — Progress Notes (Signed)
    Daily Group Progress Note  Program: CD-IOP   Group Time: 1-2:30 pm  Participation Level: Active  Behavioral Response: Sharing  Type of Therapy: Process Group  Topic: Counselor and Counseling intern led a group session with an emphasis on group process. Patients shared about challenges and successes related to their recovery and addiction. One new patient was present today and met with the Investment banker, operational. Another patient arrived 15 minutes late and was visibly drowsy and talking slowly. When confronted patient claimed that he was taking muscle relaxers due to allergies. Counselor escorted him out of the room to be checked by the Investment banker, operational. Random urine samples were taken from 3 patients today.  Group Time: 2:45- 4pm  Participation Level: Active  Behavioral Response: Sharing  Type of Therapy: Psycho-education Group  Topic: Counselor and Counseling intern led a group session with an emphasis on psychoeducation. One member was present for only the psychoeducation session due to a prior conflict. Counselors allowed significant time for patients to process the triggering and "uncomfortable" feelings from the earlier process session. Counselor used open questions to help patients discuss their experience of "confronting addiction with assertiveness". The counselor helped to frame the situation as a growing and learning experience.  Summary: Patient arrived on time and presented as active and engaged to group today. He was well groomed, made strong eye contact, and seemed very comfortable sharing with the group about his upcoming graduation tomorrow. Patient has made good progress in his early recovery. He reported on attending the gym regularly, attending at least 1 AA meeting per day. When asked about graduation, he appeared sad and at a loss for words. Patient reported that he has nearly secured a temporary sponsor which has been an ongoing struggle throughout his treatment.  Counselor reminded patient that he needs to secure a PCD or psychiatrist to manage his medications after leaving. He spoke about his tendency to get aggravated easily and a desire to continue working on his coping skills and exploring his family of origin story. Patient's sobriety date is 1/23.   Family Program: Family present? No   Name of family member(s):   UDS collected: No Results:  AA/NA attended?: [place] and Tuesday  Sponsor?: No   Aaryn Sermon, LCAS

## 2015-05-18 NOTE — Addendum Note (Signed)
Addended byLogan Bores: Brandii Lakey on: 05/18/2015 02:06 PM   Modules accepted: Medications

## 2015-05-19 ENCOUNTER — Other Ambulatory Visit (HOSPITAL_COMMUNITY): Payer: BLUE CROSS/BLUE SHIELD

## 2015-05-20 ENCOUNTER — Other Ambulatory Visit (HOSPITAL_COMMUNITY): Payer: BLUE CROSS/BLUE SHIELD

## 2015-05-24 ENCOUNTER — Other Ambulatory Visit (HOSPITAL_COMMUNITY): Payer: BLUE CROSS/BLUE SHIELD

## 2015-05-25 ENCOUNTER — Ambulatory Visit (INDEPENDENT_AMBULATORY_CARE_PROVIDER_SITE_OTHER): Payer: BLUE CROSS/BLUE SHIELD | Admitting: Licensed Clinical Social Worker

## 2015-05-25 DIAGNOSIS — F1020 Alcohol dependence, uncomplicated: Secondary | ICD-10-CM

## 2015-05-26 ENCOUNTER — Ambulatory Visit (HOSPITAL_COMMUNITY): Payer: BLUE CROSS/BLUE SHIELD | Admitting: Licensed Clinical Social Worker

## 2015-05-26 NOTE — Progress Notes (Signed)
IOP Group Progress Note Group Time: 5:00-6:00 pm  Participation Level: Active  Behavioral Response: Appropriate  Type of Therapy:  Group Therapy/Psychoeducation  Summary of Progress: Pt participated in a discussion on AA meetings, sponsorship and step work - the importance or the unimportance of 12 step recovery.         Kobie Matkins S, Licensed Cli

## 2015-05-27 ENCOUNTER — Other Ambulatory Visit (HOSPITAL_COMMUNITY): Payer: Self-pay | Admitting: Medical

## 2015-06-01 ENCOUNTER — Ambulatory Visit (HOSPITAL_COMMUNITY): Payer: Self-pay | Admitting: Licensed Clinical Social Worker

## 2015-06-02 ENCOUNTER — Telehealth (HOSPITAL_COMMUNITY): Payer: Self-pay | Admitting: Licensed Clinical Social Worker

## 2015-06-08 ENCOUNTER — Ambulatory Visit (INDEPENDENT_AMBULATORY_CARE_PROVIDER_SITE_OTHER): Payer: BLUE CROSS/BLUE SHIELD | Admitting: Licensed Clinical Social Worker

## 2015-06-08 DIAGNOSIS — F1020 Alcohol dependence, uncomplicated: Secondary | ICD-10-CM | POA: Diagnosis not present

## 2015-06-09 NOTE — Progress Notes (Signed)
   THERAPIST PROGRESS NOTE  Session Time: 5-6pm  Participation Level: Active  Behavioral Response: Well GroomedAlert  Type of Therapy: Psycho-education Group  Treatment Goals addressed: Diagnosis: Addiction and Recovery  Interventions: Supportive  Summary: Pt was active during the intervention on addiction and recovery. Pt was able to identify how he is continuing to stay sober by learning the skills he learned in CD-IOP. Pt was open and honest during the intervention.   Marland Kitchen.     [name],[name], Licensed Cli 06/09/2015

## 2015-06-15 ENCOUNTER — Ambulatory Visit (INDEPENDENT_AMBULATORY_CARE_PROVIDER_SITE_OTHER): Payer: BLUE CROSS/BLUE SHIELD | Admitting: Licensed Clinical Social Worker

## 2015-06-15 DIAGNOSIS — F1020 Alcohol dependence, uncomplicated: Secondary | ICD-10-CM | POA: Diagnosis not present

## 2015-06-16 NOTE — Progress Notes (Signed)
Daily Group Progress Note  Program: Aftercare Group  Group Time: 5:00-6:00pm  Participation Level: Active  Behavioral Response: Appropriate  Type of Therapy:  Group Therapy  Summary of Progress: Pt. Presented as talkative, engaged in the group process. Pt. Participated in discussion about the recovery pie. Pt identified which areas of his recovery he struggles with and which areas he is doing really well.  Pt was encouraged to work on the areas he struggles with: getting a sponsor, working the steps and following directions by the sponsor.

## 2015-06-17 ENCOUNTER — Ambulatory Visit (INDEPENDENT_AMBULATORY_CARE_PROVIDER_SITE_OTHER): Payer: BLUE CROSS/BLUE SHIELD | Admitting: Licensed Clinical Social Worker

## 2015-06-17 DIAGNOSIS — F1020 Alcohol dependence, uncomplicated: Secondary | ICD-10-CM | POA: Diagnosis not present

## 2015-06-17 NOTE — Progress Notes (Signed)
   THERAPIST PROGRESS NOTE  Session Time: 9:45-10:35  Participation Level: Active  Behavioral Response: CasualAlert/Upbeat mood  Type of Therapy: Individual Therapy  Treatment Goals addressed: effects of alcoholism on actions, behaviors and results  Interventions: CBT, Supportive and Reframing  Summary: Ian PlowmanJonathan N [name] is a 35 y.o. male who presents upbeat for his first individual session. Pt completed CD-IOP and [facility] Pt asked to see an individual therapist to discuss issues of family dynamics, guilt/shame related to his alcoholism.   Suicidal/Homicidal: No  Therapist Response: Pt identified goals for individual therapy. Pt struggles in his recovery. He is dry but not in recovery. He goes to [employer] meetings and talks with people in recovery but he does not have a sponsor nor is he working the steps. Pt has a long hx of alcoholism and has childhood issues that began in elementary school. "Im afraid of everything, beginning in elementary school." Will see pt weekly if his work schedule allows. Made appt for next week.  Plan: Return again in 1 weeks.     Deiona Hooper S, Licensed Cli 06/17/2015

## 2015-06-22 ENCOUNTER — Ambulatory Visit (HOSPITAL_COMMUNITY): Payer: Self-pay | Admitting: Licensed Clinical Social Worker

## 2015-06-23 ENCOUNTER — Other Ambulatory Visit (HOSPITAL_COMMUNITY): Payer: Self-pay | Admitting: Medical

## 2015-06-24 ENCOUNTER — Ambulatory Visit (INDEPENDENT_AMBULATORY_CARE_PROVIDER_SITE_OTHER): Payer: BLUE CROSS/BLUE SHIELD | Admitting: Licensed Clinical Social Worker

## 2015-06-24 DIAGNOSIS — F1020 Alcohol dependence, uncomplicated: Secondary | ICD-10-CM

## 2015-06-24 NOTE — Progress Notes (Signed)
Ian [name] is a 35 y.o. male patient. Met with pt for 50 minutes today for weekly individual session. Pt is still working on learning to stay sober. Pt goes regularly to meetings but still has no sponsor and is not working the steps. Pt has identified why he doesn't want a sponsor: doesn't want someone telling him what to do. Discussed accountability with pt. Pt accepts that he needs accountability in order to maintain his sobriety. Pt wanted to process his relationship with his father, who has recently come back in his life. Pt has abandonment issues with both parents. Processed these feelings with him and role played what he wants to say to them. Also did "empty chair" with pt, allowing him to say what he wants to be able to say to both his parents. Pt was tearful during this reenactment. Will continue to see pt weekly as much as his work schedule allows. Gave pt a copy of fair fighting rules to use with his mother and stepfather.                                                   Raksha Wolfgang S, Licensed Cli

## 2015-06-24 NOTE — Progress Notes (Deleted)
   THERAPIST PROGRESS NOTE  Session Time: ***  Participation Level: {[facility] PARTICIPATION LEVEL:22264}  Behavioral Response: {Appearance:22683}{[facility] LEVEL OF CONSCIOUSNESS:22305}{[facility] MOOD:22306}  Type of Therapy: {CHL AMB BH Type of Therapy:21022741}  Treatment Goals addressed: {CHL AMB BH Treatment Goals Addressed:21022754}  Interventions: {CHL AMB BH Type of Intervention:21022753}  Summary: Ian PlowmanJonathan N [name] is a 35 y.o. male who presents with ***.   Suicidal/Homicidal: {[facility] YES OR NO:22294}{yes/no/with/without intent/plan:22693}  Therapist Response: ***  Plan: Return again in *** weeks.  Diagnosis: Axis I: {psych axis 1:31909}    Axis II: {psych axis 2:31910}    [name], Licensed Cli 06/24/2015

## 2015-06-29 ENCOUNTER — Ambulatory Visit (HOSPITAL_COMMUNITY): Payer: Self-pay | Admitting: Licensed Clinical Social Worker

## 2015-07-01 ENCOUNTER — Ambulatory Visit (INDEPENDENT_AMBULATORY_CARE_PROVIDER_SITE_OTHER): Payer: BLUE CROSS/BLUE SHIELD | Admitting: Licensed Clinical Social Worker

## 2015-07-01 DIAGNOSIS — F411 Generalized anxiety disorder: Secondary | ICD-10-CM

## 2015-07-01 DIAGNOSIS — F1020 Alcohol dependence, uncomplicated: Secondary | ICD-10-CM

## 2015-07-01 NOTE — Progress Notes (Signed)
   THERAPIST PROGRESS NOTE  Session Time: 9:40-10:30am  Participation Level: Active  Behavioral Response: CasualAlert/upbeat  Type of Therapy: Individual Therapy  Treatment Goals addressed: Coping  Interventions: CBT  Summary: Pt continues to feel frustrated with the outstanding court issue. Pt has been sober for 6 mos and is asking the court to let him transport his son. Pt must stay at his parent's house when he son (34yo) visits each week. Parents and pt are frustrated and there is frustration, lack of positive communication skills, and anger displayed in the home. Pt was asked "What if the court does not change the court order, what would it be like to accept the decision made by the court?" Pt reported it is very tiring and frustrating having to stay at his parents house, so if the court order does  Not change it will be difficult for him to accept the decision. Pt discussed he has resentments with all involved (son, son's mother, his mother), however it was pointed out that all of these restrictions are because of his poor decision making (DWI with son in the car). Discussed with pt his reticence to be accountable and accept his own responsibility for the situation. Pt cannot wait for the court to make it better, pt has to find a way to live with the decision.  Pt is still going to 5-10 meetings per week. He is sober but not working a Product managerrecovery program.   Suicidal/Homicidal: No  Therapist Response: Assessed pt's current functioning and reviewed progress. Assisted pt processing issues between his parents, son, son's mother and the court decision. Processed with pt the importance of having a sponsor and working the steps is part of a recovery program. Assisted pt in dealing with anxiety in a situation he has  No control over.   Plan: Return again in 1 week. 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

## 2015-07-06 ENCOUNTER — Ambulatory Visit (HOSPITAL_COMMUNITY): Payer: Self-pay | Admitting: Licensed Clinical Social Worker

## 2015-07-08 ENCOUNTER — Ambulatory Visit (INDEPENDENT_AMBULATORY_CARE_PROVIDER_SITE_OTHER): Payer: BLUE CROSS/BLUE SHIELD | Admitting: Licensed Clinical Social Worker

## 2015-07-08 DIAGNOSIS — F411 Generalized anxiety disorder: Secondary | ICD-10-CM | POA: Diagnosis not present

## 2015-07-08 DIAGNOSIS — F1020 Alcohol dependence, uncomplicated: Secondary | ICD-10-CM

## 2015-07-08 NOTE — Progress Notes (Signed)
   THERAPIST PROGRESS NOTE  Session Time: 9:40-10:30am  Participation Level: Active  Behavioral Response: CasualAlertAnxious  Type of Therapy: Individual Therapy  Treatment Goals addressed: Anxiety and Coping  Interventions: CBT, Solution Focused, Supportive and Reframing  Summary: Ian PlowmanJonathan N [name] is a 35 y.o. male who presents with alcohol use disorder and anxiety which has manifested more since spending more time with parents. Pt has his son every other week in the summer. Due to transportation constraints pt and son stay with his parents during this time.Mother is very critical of son but still insists on helping him.Worked with pt on "fair fighting rules" and "I statements" when communicating with his mother. Role played with pt,. Pt's anxiety has increased. Worked with pt using his coping skills and breathing techniques. Pt still continues to go to meetings almost daily. However when son is with him, his mother ridicules pt about not spending enough time with son. Pt still has no sponsor or home group. Still continue to encourage pt to work a program of recovery.   Suicidal/Homicidal: No  Therapist Response: worked with pt by reframing statements that he hears from his mother. Worked with pt on changing his thought patterns. Processed with pt communication skills. Challenged pt to find a sponsor and a home group.  Plan: Return again in 1 weeks.  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

## 2015-07-13 ENCOUNTER — Ambulatory Visit (HOSPITAL_COMMUNITY): Payer: Self-pay | Admitting: Licensed Clinical Social Worker

## 2015-07-15 ENCOUNTER — Ambulatory Visit (HOSPITAL_COMMUNITY): Payer: Self-pay | Admitting: Licensed Clinical Social Worker

## 2015-07-20 ENCOUNTER — Ambulatory Visit (INDEPENDENT_AMBULATORY_CARE_PROVIDER_SITE_OTHER): Payer: BLUE CROSS/BLUE SHIELD | Admitting: Licensed Clinical Social Worker

## 2015-07-20 DIAGNOSIS — F1020 Alcohol dependence, uncomplicated: Secondary | ICD-10-CM | POA: Diagnosis not present

## 2015-07-21 NOTE — Progress Notes (Signed)
   THERAPIST PROGRESS NOTE  Session Time: 5:10-6:00pm  Participation Level: Active  Behavioral Response: CasualAlert/Upbeat  Type of Therapy: Individual Therapy  Treatment Goals addressed: Coping  Interventions: CBT  Summary: Ian PlowmanJonathan N [name] is a 35 y.o. male who presents with an alcohol use disorder.   Suicidal/Homicidal: Nowithout intent/plan  Therapist Response: Pt has almost 6 months sobriety (7/23). Pt still struggles with his lifestyle of staying with his parents when he has his son. There is a lot of chaos and criticism in his parent's house. Discussed fair fighting rules and "I message" statements to engage in communication with his parents. Role-played with pt these skills. Pt still goes to [employer] meetings almost daily.   Plan: Return again in 1 weeks.  Diagnosis: Axis I: F10.20    Axis II: No DX    [name],[name], Licensed Cli 07/21/2015

## 2015-07-27 ENCOUNTER — Ambulatory Visit (HOSPITAL_COMMUNITY): Payer: Self-pay | Admitting: Licensed Clinical Social Worker

## 2015-07-28 ENCOUNTER — Other Ambulatory Visit (HOSPITAL_COMMUNITY): Payer: Self-pay | Admitting: Medical

## 2015-08-02 ENCOUNTER — Ambulatory Visit (INDEPENDENT_AMBULATORY_CARE_PROVIDER_SITE_OTHER): Payer: BLUE CROSS/BLUE SHIELD | Admitting: Licensed Clinical Social Worker

## 2015-08-02 DIAGNOSIS — F411 Generalized anxiety disorder: Secondary | ICD-10-CM

## 2015-08-02 DIAGNOSIS — F1020 Alcohol dependence, uncomplicated: Secondary | ICD-10-CM

## 2015-08-02 NOTE — Progress Notes (Signed)
   THERAPIST PROGRESS NOTE  Session Time: 2:15-3:00pm  Participation Level: Active  Behavioral Response: CasualAlertAnxious and Depressed  Type of Therapy: Individual Therapy  Treatment Goals addressed: Coping  Interventions: CBT and Supportive  Summary: Ian [name] is a 35 y.o. male who presents depressed and anxious.   Suicidal/Homicidal: Nowithout intent/plan  Therapist Response: Pt was tearful in his session today. Pt feels hopeless in his realationship with his step-father and mother. It is not a healthy situation. Pt has no choice but to stay with them when he has visitiation with his son. There are too many past hurts in his family history as well as currently. Role-played communication style using "I messages." Pt is resistant to that style because that is not how his s/f & mother communicate to him or his son. Pt had written some thoughts out that he would like to say to both of them. These are his feelings. Worked with pt to change some of the verbage to a positive communication style. Pt thought about his previous relapses and wondered what precipated his past relapses. He used his coping skills of going to the gym, going to an AA meeting and calling some of his sober friends. He did this for 2 days this weekend and reported it felt good. Discussed with pt the continued need to have a sponsor.    Plan: Return again in 1 weeks.  Diagnosis: Axis I: F10.20    Axis II: No DX    [name],[name], LCAS-A 08/02/2015

## 2015-08-03 ENCOUNTER — Ambulatory Visit (INDEPENDENT_AMBULATORY_CARE_PROVIDER_SITE_OTHER): Payer: BLUE CROSS/BLUE SHIELD | Admitting: Licensed Clinical Social Worker

## 2015-08-03 DIAGNOSIS — F1020 Alcohol dependence, uncomplicated: Secondary | ICD-10-CM

## 2015-08-05 NOTE — Progress Notes (Signed)
    AFTERCARE GROUP PROGRESS NOTE  Session Time: 5:15-6:15  Participation Level: Active  Behavioral Response: CasualAlert  Type of Therapy: Process Group  Treatment Goals addressed: Coping  Interventions: CBT  Summary: Ian [name] is a 35 y.o. male. Pt shared how he's staying sober. Pt is going to meetings almost daily. Pt still has not gotten a sponsor. Pt was encouraged to continue with his meetings, calling sober AA members.Pt is still working on family communication. Gave pt homework on family communication.  Suicidal/Homicidal: Nowithout intent/plan  Therapist Response: Pt is encouraged to find a sponsor and continue to work a program of recovery. Pt is encouraged to work on using his "I statements with hsi family.  Plan: Return again in 1 weeks.  Diagnosis: Axis I: F10.20    Axis II: No DX    Ian [name] S, LCAS-A 08/05/2015

## 2015-08-06 ENCOUNTER — Other Ambulatory Visit (HOSPITAL_COMMUNITY): Payer: Self-pay | Admitting: Medical

## 2015-08-06 ENCOUNTER — Ambulatory Visit (HOSPITAL_COMMUNITY): Payer: Self-pay | Admitting: Psychiatry

## 2015-08-10 ENCOUNTER — Ambulatory Visit (INDEPENDENT_AMBULATORY_CARE_PROVIDER_SITE_OTHER): Payer: BLUE CROSS/BLUE SHIELD | Admitting: Licensed Clinical Social Worker

## 2015-08-10 ENCOUNTER — Other Ambulatory Visit (HOSPITAL_COMMUNITY): Payer: Self-pay

## 2015-08-10 DIAGNOSIS — F1020 Alcohol dependence, uncomplicated: Secondary | ICD-10-CM | POA: Diagnosis not present

## 2015-08-10 DIAGNOSIS — F331 Major depressive disorder, recurrent, moderate: Secondary | ICD-10-CM | POA: Diagnosis not present

## 2015-08-10 DIAGNOSIS — F411 Generalized anxiety disorder: Secondary | ICD-10-CM | POA: Diagnosis not present

## 2015-08-10 MED ORDER — NALTREXONE HCL 50 MG PO TABS: 50.0000 mg | ORAL_TABLET | Freq: Every day | ORAL | 0 refills | Status: DC

## 2015-08-10 MED ORDER — ACAMPROSATE CALCIUM 333 MG PO TBEC: 666.0000 mg | DELAYED_RELEASE_TABLET | Freq: Three times a day (TID) | ORAL | 0 refills | Status: DC

## 2015-08-10 MED ORDER — HYDROXYZINE PAMOATE 50 MG PO CAPS: 50.0000 mg | ORAL_CAPSULE | Freq: Four times a day (QID) | ORAL | 1 refills | Status: DC | PRN

## 2015-08-11 NOTE — Progress Notes (Signed)
Daily Group Progress Note  Program: Aftercare   Group Time: 5:15-6:15pm  Participation Level: Active  Behavioral Response: Appropriate  Type of Therapy:  Psychoeducation/Group process  Summary of Progress: Pt participated in a discussion on the use of boundaries as family members are the first to bring up the past mistakes made. Pt was encouraged to remain in the present, and use non-permeable boundaries for protection against words.   Frimet Durfee S Cleophas Yoak, LCAS-A 

## 2015-08-17 ENCOUNTER — Ambulatory Visit (INDEPENDENT_AMBULATORY_CARE_PROVIDER_SITE_OTHER): Payer: BLUE CROSS/BLUE SHIELD | Admitting: Licensed Clinical Social Worker

## 2015-08-17 DIAGNOSIS — F1020 Alcohol dependence, uncomplicated: Secondary | ICD-10-CM | POA: Diagnosis not present

## 2015-08-17 DIAGNOSIS — F411 Generalized anxiety disorder: Secondary | ICD-10-CM

## 2015-08-18 ENCOUNTER — Encounter (HOSPITAL_COMMUNITY): Payer: Self-pay | Admitting: Licensed Clinical Social Worker

## 2015-08-18 NOTE — Progress Notes (Signed)
Daily Group Progress Note  Program: Aftercare   Group Time: 5:15-6:15pm  Participation Level: Active  Behavioral Response: Appropriate  Type of Therapy:  Psychoeducation/Group process  Summary of Progress: Pt participated in a discussion on the use of boundaries within an upcoming family gathering. Pt is going to [place] for his brother-in-laws retirement from the [employer]. Pt will be around a lot of family and friends from his past.  Pt was encouraged to remain in the present, and use non- permeable boundaries for protection against words. Role-played scenarios that could become present during the gathering. Worked with pt on some safety zones to have in place prior to the long weekend. Pt felt encouraged with all the plans in place.   Vernona RiegerLisbeth S Najir Roop, LCASA

## 2015-08-24 ENCOUNTER — Ambulatory Visit (INDEPENDENT_AMBULATORY_CARE_PROVIDER_SITE_OTHER): Payer: BLUE CROSS/BLUE SHIELD | Admitting: Licensed Clinical Social Worker

## 2015-08-24 DIAGNOSIS — F411 Generalized anxiety disorder: Secondary | ICD-10-CM | POA: Diagnosis not present

## 2015-08-24 DIAGNOSIS — F1020 Alcohol dependence, uncomplicated: Secondary | ICD-10-CM

## 2015-08-25 ENCOUNTER — Ambulatory Visit (INDEPENDENT_AMBULATORY_CARE_PROVIDER_SITE_OTHER): Payer: BLUE CROSS/BLUE SHIELD | Admitting: Licensed Clinical Social Worker

## 2015-08-25 ENCOUNTER — Other Ambulatory Visit (HOSPITAL_COMMUNITY): Payer: Self-pay | Admitting: Medical

## 2015-08-25 ENCOUNTER — Encounter (HOSPITAL_COMMUNITY): Payer: Self-pay | Admitting: Licensed Clinical Social Worker

## 2015-08-25 DIAGNOSIS — F331 Major depressive disorder, recurrent, moderate: Secondary | ICD-10-CM | POA: Diagnosis not present

## 2015-08-25 DIAGNOSIS — F411 Generalized anxiety disorder: Secondary | ICD-10-CM

## 2015-08-25 DIAGNOSIS — F1020 Alcohol dependence, uncomplicated: Secondary | ICD-10-CM | POA: Diagnosis not present

## 2015-08-25 NOTE — Progress Notes (Signed)
Daily Group Progress Note  Program: Aftercare     Group Time: 5:15-6:30  Participation Level: Active  Behavioral Response: Appropriate  Type of Therapy:  Psychotherapy/Process   Summary of Progress:  Pt participated in a discussion on boundaries. Pt was encouraged to use non-permeable boundaries when dealing with problematic people, including family. Pt was receptive to the psychotherapy and suggestions on boundaries.      Lisbeth [name], LCASA  

## 2015-08-26 ENCOUNTER — Encounter (HOSPITAL_COMMUNITY): Payer: Self-pay | Admitting: Licensed Clinical Social Worker

## 2015-08-26 NOTE — Progress Notes (Signed)
   THERAPIST PROGRESS NOTE  Session Time: 4:10-5:00pm  Participation Level: Active  Behavioral Response: CasualAlert  Type of Therapy: Individual Therapy  Treatment Goals addressed: Coping  Interventions: Strength-based and Supportive  Summary: Ian PlowmanJonathan N Fleming is a 35 y.o. male. Pt presented today mildly depressed with some anxiety. Pt had been out of the town over the weekend with extended family. HE was in a risky situation but did not drink. Pt was frustrated that he was stuck with his parents who ridicule him repeatedly for 5 days. Encouraged pt to look at the positive side of the visit and what he was grateful for. PT shared it was good to see extended family members and old childhood friends. He still struggles with the passive aggressive behavior from his parents. Pt also engages in this type of behavior with his parents. Pt was encouraged to continue to use effective communication skills that we have role played repeatedly. PT did not go to any AA meetings while he was out of town, due to transportation problems. He continues to go to meetings locally, is not drinking. However he still does not have a sponsor nor is he working the steps. Pt was encouraged to still look for someone to guide him.  Suicidal/Homicidal: Nowithout intent/plan  Therapist Response: Assessed pt's current functioning and reviewed progress. Assisted pt in processing his negative behaviors with his parents, Communication skills, and recovery. Plan: Return again in 2 weeks.  Diagnosis: Axis I: Major Depressive Disorder, Recurrent episode, moderate Alcohol use disorder GAD        Braden Deloach S, LCAS-A 08/26/2015

## 2015-08-30 ENCOUNTER — Other Ambulatory Visit (HOSPITAL_COMMUNITY): Payer: Self-pay | Admitting: Medical

## 2015-08-31 ENCOUNTER — Ambulatory Visit (INDEPENDENT_AMBULATORY_CARE_PROVIDER_SITE_OTHER): Payer: BLUE CROSS/BLUE SHIELD | Admitting: Licensed Clinical Social Worker

## 2015-08-31 DIAGNOSIS — F1021 Alcohol dependence, in remission: Secondary | ICD-10-CM | POA: Diagnosis not present

## 2015-09-01 ENCOUNTER — Ambulatory Visit (INDEPENDENT_AMBULATORY_CARE_PROVIDER_SITE_OTHER): Payer: BLUE CROSS/BLUE SHIELD | Admitting: Licensed Clinical Social Worker

## 2015-09-01 ENCOUNTER — Encounter (HOSPITAL_COMMUNITY): Payer: Self-pay | Admitting: Licensed Clinical Social Worker

## 2015-09-01 DIAGNOSIS — F1021 Alcohol dependence, in remission: Secondary | ICD-10-CM

## 2015-09-01 DIAGNOSIS — F411 Generalized anxiety disorder: Secondary | ICD-10-CM

## 2015-09-01 NOTE — Progress Notes (Signed)
Daily Group Progress Note  Program: Aftercare   Group Time: 5:00-6:00pm  Participation Level: Active  Behavioral Response: Appropriate  Type of Therapy:  Psychoeducation/Group process  Summary of Progress: Pt participated in a discussion on accountability in recovery. Pt was encouraged to continue meeting attendance, working with sponsor, talking to sober people, and self-care.   Vernona RiegerLisbeth S [name], LCAS-A

## 2015-09-02 ENCOUNTER — Encounter (HOSPITAL_COMMUNITY): Payer: Self-pay | Admitting: Licensed Clinical Social Worker

## 2015-09-02 NOTE — Progress Notes (Signed)
   THERAPIST PROGRESS NOTE  Session Time: 2:00-2:30pm  Participation Level: Active  Behavioral Response: CasualAlertAnxious  Type of Therapy: Individual Therapy  Treatment Goals addressed: Coping  Interventions: Supportive  Summary: Ian PlowmanJonathan N [name] is a 35 y.o. male. Pt was upbeat. He is really working on himself. He went and spoke with a real estate agent about going to school and working as an Water quality scientistagent. Pt was excited about the prospect. Pt went to a good aa meeting this morning about faith. He took a lot away from the meeting and is re-examining his faith. He is trying to take responsibility for his life. Pt is making positive steps in his life. Congratulated pt on his positive steps.  Suicidal/Homicidal: Nowithout intent/plan  Therapist Response: Assessed pt's current funcntioning and reviewed progress.. Assisted pt in processing issues with his recovery (no sponsor), and responsibility. Helped pt processing for management of his stressors (his parents.)  Plan: Return again in 2 weeks.  Diagnosis: Axis I: Alcohol use disorder, in partial remission        Niesha Bame S, LCAS-A 09/02/2015

## 2015-09-07 ENCOUNTER — Ambulatory Visit (INDEPENDENT_AMBULATORY_CARE_PROVIDER_SITE_OTHER): Payer: BLUE CROSS/BLUE SHIELD | Admitting: Licensed Clinical Social Worker

## 2015-09-07 ENCOUNTER — Encounter (HOSPITAL_COMMUNITY): Payer: Self-pay | Admitting: Licensed Clinical Social Worker

## 2015-09-07 DIAGNOSIS — F411 Generalized anxiety disorder: Secondary | ICD-10-CM

## 2015-09-07 DIAGNOSIS — F1021 Alcohol dependence, in remission: Secondary | ICD-10-CM | POA: Diagnosis not present

## 2015-09-07 NOTE — Progress Notes (Signed)
Daily Group Progress Note  Program: Aftercare   Group Time: 5:00-6:15pm  Participation Level: Active  Behavioral Response: Appropriate  Type of Therapy:  Psychoeducation/Group process  Summary of Progress: Pt participated in a discussion on overthinking the small things. Now since being sober pt is feeling unknown feelings and seems to overthink everything. Pt participated in a discussion about the feelings wheel and the importance of opening up with emotions.    [name] [name], LCAS-A 

## 2015-09-08 ENCOUNTER — Telehealth (HOSPITAL_COMMUNITY): Payer: Self-pay | Admitting: Licensed Clinical Social Worker

## 2015-09-08 ENCOUNTER — Other Ambulatory Visit (HOSPITAL_COMMUNITY): Payer: Self-pay

## 2015-09-08 ENCOUNTER — Other Ambulatory Visit (HOSPITAL_COMMUNITY): Payer: Self-pay | Admitting: Medical

## 2015-09-08 MED ORDER — NALTREXONE HCL 50 MG PO TABS
50.0000 mg | ORAL_TABLET | Freq: Every day | ORAL | 0 refills | Status: DC
Start: 2015-09-08 — End: 2015-09-08

## 2015-09-08 NOTE — Progress Notes (Unsigned)
Patients refill of naltrexone was sent to the pharmacy with the approval of Maryjean Mornharles Kober PA-C

## 2015-09-09 ENCOUNTER — Encounter (HOSPITAL_COMMUNITY): Payer: Self-pay | Admitting: Licensed Clinical Social Worker

## 2015-09-09 ENCOUNTER — Telehealth (HOSPITAL_COMMUNITY): Payer: Self-pay | Admitting: Licensed Clinical Social Worker

## 2015-09-09 ENCOUNTER — Other Ambulatory Visit (HOSPITAL_COMMUNITY): Payer: Self-pay

## 2015-09-09 ENCOUNTER — Ambulatory Visit (INDEPENDENT_AMBULATORY_CARE_PROVIDER_SITE_OTHER): Payer: BLUE CROSS/BLUE SHIELD | Admitting: Licensed Clinical Social Worker

## 2015-09-09 DIAGNOSIS — F411 Generalized anxiety disorder: Secondary | ICD-10-CM

## 2015-09-09 DIAGNOSIS — F1021 Alcohol dependence, in remission: Secondary | ICD-10-CM | POA: Diagnosis not present

## 2015-09-09 MED ORDER — ACAMPROSATE CALCIUM 333 MG PO TBEC: 666.0000 mg | DELAYED_RELEASE_TABLET | Freq: Three times a day (TID) | ORAL | 0 refills | Status: DC

## 2015-09-09 MED ORDER — QUETIAPINE FUMARATE 25 MG PO TABS: 50.0000 mg | ORAL_TABLET | Freq: Every day | ORAL | 0 refills | Status: DC

## 2015-09-09 NOTE — Progress Notes (Signed)
   THERAPIST PROGRESS NOTE  Session Time: 2:10-3pm  Participation Level: Active  Behavioral Response: CasualAlertEuthymic  Type of Therapy: Individual Therapy  Treatment Goals addressed: Coping  Interventions: Supportive  Summary: Ian PlowmanJonathan N Mapp is a 35 y.o. male. Pt shared about his upcoming custody hearing and frustration surrounding his son's custody. Pt gets anxiety when he thinks about upcoming case. Worked with pt on his breathing and meditation. Discussed with pt his lack of control on most of his issues that cause him anxiety. Pt is wanting to return to the church. Discussed this at length with pt. Pt still goes to 5-6 AA meetings per week but has no sponsor, still. As usual, pointed out that pt is not working a program of recovery. Pt was receptive in suggestions.  Suicidal/Homicidal: Nowithout intent/plan  Therapist Response: Assessed pt's current functioning and reviewed progress. Assisted pt process issues with his custody case, his recovery, and processing for management of stressors.  Plan: Return again in 2 weeks.  Diagnosis: Axis I: F10.21, GAD        Zawadi Aplin S, LCAS-A 09/09/2015

## 2015-09-14 ENCOUNTER — Ambulatory Visit (HOSPITAL_COMMUNITY): Payer: BLUE CROSS/BLUE SHIELD | Admitting: Licensed Clinical Social Worker

## 2015-09-21 ENCOUNTER — Ambulatory Visit (INDEPENDENT_AMBULATORY_CARE_PROVIDER_SITE_OTHER): Payer: BLUE CROSS/BLUE SHIELD | Admitting: Licensed Clinical Social Worker

## 2015-09-21 DIAGNOSIS — F411 Generalized anxiety disorder: Secondary | ICD-10-CM | POA: Diagnosis not present

## 2015-09-21 DIAGNOSIS — F1021 Alcohol dependence, in remission: Secondary | ICD-10-CM

## 2015-09-22 ENCOUNTER — Encounter (HOSPITAL_COMMUNITY): Payer: Self-pay | Admitting: Licensed Clinical Social Worker

## 2015-09-22 NOTE — Progress Notes (Signed)
Patient ID: Ian PlowmanJonathan N Sheils, male   DOB: 12/23/1980, 35 y.o.   MRN: [id number]    Daily Group Progress Note  Program: Aftercare   Group Time: 5:00-6:30pm  Participation Level: Active  Behavioral Response: Appropriate  Type of Therapy:  Psychoeducation/Group process  Summary of Progress: Pt participated in a discussion on relationships in early recovery.  Family members may struggle with their loved ones who are in early recovery. Open communication using "I statements" is a benefit to relationship success. Pt participated in the discussion and was open to suggestions.   Vernona RiegerLisbeth S [name], LCAS-A

## 2015-09-23 ENCOUNTER — Other Ambulatory Visit (HOSPITAL_COMMUNITY): Payer: Self-pay | Admitting: Medical

## 2015-09-27 ENCOUNTER — Other Ambulatory Visit (HOSPITAL_COMMUNITY): Payer: Self-pay | Admitting: Medical

## 2015-09-27 DIAGNOSIS — F411 Generalized anxiety disorder: Secondary | ICD-10-CM

## 2015-09-27 MED ORDER — PROPRANOLOL HCL 20 MG PO TABS: ORAL_TABLET | ORAL | 2 refills | Status: DC

## 2015-09-28 ENCOUNTER — Other Ambulatory Visit (HOSPITAL_COMMUNITY): Payer: Self-pay | Admitting: Medical

## 2015-09-28 ENCOUNTER — Ambulatory Visit (INDEPENDENT_AMBULATORY_CARE_PROVIDER_SITE_OTHER): Payer: BLUE CROSS/BLUE SHIELD | Admitting: Licensed Clinical Social Worker

## 2015-09-28 DIAGNOSIS — F1021 Alcohol dependence, in remission: Secondary | ICD-10-CM | POA: Diagnosis not present

## 2015-09-28 DIAGNOSIS — F411 Generalized anxiety disorder: Secondary | ICD-10-CM | POA: Diagnosis not present

## 2015-09-29 ENCOUNTER — Ambulatory Visit (HOSPITAL_COMMUNITY): Payer: Self-pay | Admitting: Psychiatry

## 2015-09-29 ENCOUNTER — Encounter (HOSPITAL_COMMUNITY): Payer: Self-pay | Admitting: Licensed Clinical Social Worker

## 2015-09-29 ENCOUNTER — Ambulatory Visit (HOSPITAL_COMMUNITY): Payer: Self-pay | Admitting: Licensed Clinical Social Worker

## 2015-09-29 NOTE — Progress Notes (Signed)
Daily Group Progress Note  Program: Aftercare   Group Time: 5:00-6:30pm  Participation Level: Active  Behavioral Response: Appropriate  Type of Therapy:  Psychoeducation/Group process  Summary of Progress: Pt participated in a discussion on recovery. Pt shared what his recovery program consisted of: meetings, talking to sober friends and accountability. Pt was encouraged to continue his program of recovery and it was suggested that he still needs a sponsor who will help him with step work. For the first time pt shared that he thinks he does not a sponsor. He is so overwhelmed with the criticisms from his parents and the stress related to custody of his son. Pt was encouraged by all group members to follow through with selecting a sponsor. Pt was active during the discussion of recovery and was receptive to suggestions.       Vernona RiegerLisbeth S Liliahna Cudd, LCAS-A

## 2015-10-05 ENCOUNTER — Ambulatory Visit (INDEPENDENT_AMBULATORY_CARE_PROVIDER_SITE_OTHER): Payer: BLUE CROSS/BLUE SHIELD | Admitting: Licensed Clinical Social Worker

## 2015-10-05 DIAGNOSIS — F1021 Alcohol dependence, in remission: Secondary | ICD-10-CM | POA: Diagnosis not present

## 2015-10-05 DIAGNOSIS — F411 Generalized anxiety disorder: Secondary | ICD-10-CM

## 2015-10-11 ENCOUNTER — Ambulatory Visit (INDEPENDENT_AMBULATORY_CARE_PROVIDER_SITE_OTHER): Payer: BLUE CROSS/BLUE SHIELD | Admitting: Medical

## 2015-10-11 ENCOUNTER — Encounter (HOSPITAL_COMMUNITY): Payer: Self-pay | Admitting: Licensed Clinical Social Worker

## 2015-10-11 ENCOUNTER — Encounter (HOSPITAL_COMMUNITY): Payer: Self-pay | Admitting: Medical

## 2015-10-11 VITALS — BP 114/66 | HR 71 | Ht 71.0 in | Wt 202.2 lb

## 2015-10-11 DIAGNOSIS — F411 Generalized anxiety disorder: Secondary | ICD-10-CM

## 2015-10-11 DIAGNOSIS — K860 Alcohol-induced chronic pancreatitis: Secondary | ICD-10-CM

## 2015-10-11 DIAGNOSIS — F331 Major depressive disorder, recurrent, moderate: Secondary | ICD-10-CM | POA: Diagnosis not present

## 2015-10-11 DIAGNOSIS — F1021 Alcohol dependence, in remission: Secondary | ICD-10-CM | POA: Diagnosis not present

## 2015-10-11 DIAGNOSIS — F1994 Other psychoactive substance use, unspecified with psychoactive substance-induced mood disorder: Secondary | ICD-10-CM | POA: Diagnosis not present

## 2015-10-11 MED ORDER — QUETIAPINE FUMARATE 25 MG PO TABS: ORAL_TABLET | ORAL | 0 refills | Status: DC

## 2015-10-11 MED ORDER — HYDROXYZINE PAMOATE 50 MG PO CAPS: 50.0000 mg | ORAL_CAPSULE | Freq: Four times a day (QID) | ORAL | 2 refills | Status: DC | PRN

## 2015-10-11 MED ORDER — ACAMPROSATE CALCIUM 333 MG PO TBEC: 666.0000 mg | DELAYED_RELEASE_TABLET | Freq: Three times a day (TID) | ORAL | 2 refills | Status: DC

## 2015-10-11 MED ORDER — NALTREXONE HCL 50 MG PO TABS: 50.0000 mg | ORAL_TABLET | Freq: Every day | ORAL | 2 refills | Status: DC

## 2015-10-11 NOTE — Progress Notes (Signed)
Psychiatric Initial Adult Assessment   Patient Identification: Ian [name] MRN:  [id number] Date of Evaluation:  10/11/2015 Referral Source: : [facility] Dr Sabra Heck; Andrew [place] Chief Complaint:    Chief Complaint    Establish Care; Alcohol Problem;Anxiety;Depression   Visit Diagnosis:    ICD-9-CM ICD-10-CM   1. Alcohol use disorder, severe, in early remission, dependence ([place]) 303.93 F10.21   2. Alcohol-induced chronic pancreatitis ([place]) 577.1 K86.0   3. Substance induced mood disorder (HCC) 292.84 F19.94   4. Major depressive disorder, recurrent episode, moderate (HCC) 296.32 F33.1   5. 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 Alcohol use disorder, severe, dependence ([place]) 303.90 F10.20   2. Alcohol dependence with alcohol-induced sleep disorder ([place]) 303.90 F10.282    291.82    3. Alcohol dependence, daily use ([place]) 303.91 F10.20   4. Alcohol intoxication in alcoholism with blood level over 0.3 with complication (HCC) 300.76 F10.229   5. Hx of pancreatitis V12.79 Z87.19   6. GAD (generalized anxiety disorder) 300.02 F41.1   7. Major depressive disorder, recurrent episode, moderate (HCC) 296.32 F33.1   Treatment Plan/Recommendations: Plan of Care: [place] [facility]  Laboratory:  UDS per protocol  Psychotherapy: Individual and group  Medications: see list Rx Seroquel for mood and sleep D/C Ambien Neurontin and Doxepin  Routine PRN Medications:  Negative  Consultations: None now-consider sleep study later  Safety Concerns: Relapse/uncontrolled insomnia  Other:  NA    [facility] Discharge Summary   Ian [name] [id number] Date of Admission: 02/01/2015 Date of Discharge: 04/22/2015 Course of Treatment: Pt has done well in treatment and will complete as successful.He has been able to maintain abstinence for the longest period in recent times. He has met the requirements of meetings;group and individual counseling sessions and is to meet for sponsorship today. He has found employment .He has found Naltrexone and Campral extremely helpful for cravings. Goals and Activities to Help Maintain Sobriety: 1. Stay away from old friends who continue to drink and use mind-altering chemicals. 2. Continue practicing Fair Fighting rules in interpersonal conflicts. 3. Continue alcohol and drug refusal skills and call on support systems. 4.   Continue Medications  Referrals: Pending match to appropriate clinicians Aftercare services: [facility] OP Tuesdays at 5:00 pm 1. Attend AA/NA meetings 4 times per week.minimum.Recommend 90 meetings in 90 days initially 2. Obtain a sponsor and a home group in [place]. 3. Referrals pending 4. 3 month RX medications provided Next appointment: Aftercare Plan of Action to Address Continuing Problems:as above Client has participated in the development of this discharge plan and has received a copy of this completed plan  Darlyne Russian  04/21/2015 Darlyne Russian, PA-C 04/21/2015      Pt has been attending Aftercare program: Progress Notes by Jenkins Rouge, LCAS-A at 10/05/2015 5:15 PM  Author: Jenkins Rouge, LCAS-A Author Type: Counselor Filed: 10/11/2015 11:10 AM  Note Status: Signed Cosign: Cosign Not Required Encounter Date: 10/05/2015  Editor: Jenkins Rouge, LCAS-A (Counselor)  Sensitive Note   Daily Group Progress Note  Program:  Aftercare   Group Time: 5:15-6:15 pm Participation Level: Active Behavioral Response: Appropriate Type of Therapy:   Psychoeducation/Therapy Summary of Progress: Pt participated in a discussion on being mindful in relationships. Pt discussed her relationship with her husband while in active addiction and recovery. Pt was encouraged to use effective communication skills and honesty in her relationships. Pt participated well during  the intervention Vernona Rieger, LCAS-A        TODAY pt reports continuous abstinence since discharge.He continues with medication assisted treatments Naltrexone and Campral as well as medications for his anxiety and insomnia  Associated Signs/Symptoms: Depression Symptoms:  Currently asymptomatic on medication (Hypo) Manic Symptoms:  None Anxiety Symptoms:  Asymptomatic on meds Psychotic Symptoms:  Episodes substance use related PTSD Symptoms: Negative  Past Psychiatric History:         Alcohol/Substance Abuse Treatment Hx: Past Tx, Inpatient       : August 2011[facility], Jan 2012 [facility] and 4/13 [facility]. [facility] in 2013 for detox.          [facility]  04/13/2010;07/04/2011;01/30/2014;08/23/2014;12/14/; 1/09/20172016  Previous Psychotropic Medications:  Yes Trazodone;benzodiazepenes.  Substance Abuse History :  Substance Age of 1st Use Last Use Amount Specific Type  Nicotine 16 2001    Alcohol 16 1/20 ? 1/5 Vodka  Cannabis 19 2003 smoked MJ in college  Opiates na     Cocaine na     Methamphetamines na     LSD na     Ecstasy na     Benzodiazepines na     Caffeine na     Inhalants na     Others: na                         Consequences of Substance Abuse: Consequences of Substance Abuse: Medical Consequences:  Pancreatitis;hepatitis;insomnia with psychosis Legal Consequences:  DUI 2013;2007 Family Consequences:  Divorced and in custody battle-must use breathalyzer when with son Blackouts:   DT's: Withdrawal Symptoms:   Nausea Tremors insomnia with psychosis last episode  Past Medical History:  Past  Medical History:  Diagnosis Date  . Alcohol dependence (HCC)   . Anxiety   . Asthma   . Depression   . Hypertension   . Pancreatitis     Past Surgical History:  Procedure Laterality Date  . NO PAST SURGERIES      Family Psychiatric History: Mother OCD  Family History:  Family History  Problem Relation Age of Onset  . OCD Mother   . Heart disease Mother   . Suicidality Neg Hx     Social History:   Social History   Social History  . Marital status: Single    Spouse name: N/A  . Number of children: 1 son age 33-has 4-5 days every other week  . Years of education: [employer] and [employer]   Social History Main Topics  . Smoking status: Former Smoker    Types: Cigarettes    Quit date: 01/31/1999  . Smokeless tobacco: Never Used  . Alcohol use 36.0 oz/week    60 Shots of liquor per week     Comment: varies how much  . Drug use: No  . Sexual activity: Yes    Birth control/ protection: Condom   Other Topics Concern  . None   Social History Narrative  . Living in own apt with roomate Working at [employer] No current romantic relationship    Additional Social History:   Living/Environment/Situation:  Living Arrangements: roommate. Apt in [place] near [place] conditions (as described by patient or guardian): fair; lease expires at end of Aug-may renew.  How long has patient lived in current situation?: 11 months What is atmosphere in current home: Other (Comment) safe; clean.  Childhood History:  By whom was/is the patient raised?: Both parents Additional childhood history information: Parents divorced at pt's age 77 Description of patient's relationship with caregiver when they were a child: "fine" Patient's description of current relationship with people who raised him/her: Srained with mother, father and step father Does patient have siblings?: Yes Number of Siblings: 1  Description of patient's current relationship with siblings: "Not great with  older sister" Did patient suffer any verbal/emotional/physical/sexual abuse as a child?: No Did patient suffer from severe childhood neglect?: No Has patient ever been sexually abused/assaulted/raped as an adolescent or adult?: No Was the patient ever a victim of a crime or a disaster?: No Witnessed domestic violence?: No Has patient been effected by domestic violence as an adult?: No Education:  Highest grade of school patient has completed: 39 Currently a student?: No Contact person: [name]-STEP FATHER [phone] Learning disability?: No Employment/Work Situation: Employment situation: has been working for the past week as a Chief Strategy Officer.  Patient's job has been impacted by current illness: Yes-missing work due to being hospitalized.  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  Has patient ever been in the [employer]?: No Has patient ever served in combat?: No     Allergies:  No Known Allergies  Metabolic Disorder Labs: Lab Results  Component Value Date   HGBA1C 5.9 (H) 12/24/2014   MPG 123 12/24/2014   MPG 111 08/14/2013   Lab Results  Component Value Date   PROLACTIN 14.2 01/20/2015   Lab Results  Component Value Date   CHOL 141 01/20/2015   TRIG 106 01/20/2015   HDL 40 (L) 01/20/2015   CHOLHDL 3.5 01/20/2015   VLDL 21 01/20/2015   LDLCALC 80 01/20/2015   LDLCALC 70 08/14/2013     Current Medications: Current Outpatient Prescriptions  Medication Sig Dispense Refill  . acamprosate (CAMPRAL) 333 MG tablet Take 2 tablets (666 mg total) by mouth 3 (three) times daily. 180 tablet 2  . albuterol (PROAIR HFA) 108 (90 Base) MCG/ACT inhaler Inhale 1-2 puffs into the lungs every 4 (four) hours as needed for wheezing.     . hydrOXYzine (VISTARIL) 50 MG capsule Take 1 capsule (50 mg total) by mouth 4 (four) times daily as needed for anxiety. 120 capsule 2   . lisinopril (PRINIVIL,ZESTRIL) 5 MG tablet Take 5 mg by mouth daily.  1  . mometasone-formoterol (DULERA) 100-5 MCG/ACT AERO Inhale 2 puffs into the lungs 2 (two) times daily. 1 Inhaler 0  . naltrexone (DEPADE) 50 MG tablet Take 1 tablet (50 mg total) by mouth daily. 30 tablet 2  . propranolol (INDERAL) 20 MG tablet TAKE 1 TABLET 1/2 HOUR BEFORE BEDTIME MEDS. 30 tablet 2  . QUEtiapine (SEROQUEL) 25 MG tablet Take 1-2 tablets QHS 60 tablet 0  . doxycycline (VIBRAMYCIN) 100 MG capsule Take 100 mg by mouth 2 (two) times daily.    . mupirocin cream (BACTROBAN) 2 % Apply 1 application topically 3 (three) times daily. Apply to affected area on nose (Patient not taking: Reported on 10/11/2015) 15 g 0  . mupirocin ointment (BACTROBAN) 2 % Place 1 application into  the nose 3 (three) times daily. (Patient not taking: Reported on 10/11/2015) 22 g 0  . sulfamethoxazole-trimethoprim (BACTRIM DS,SEPTRA DS) 800-160 MG tablet Take 1 tablet by mouth 2 (two) times daily. (Patient not taking: Reported on 10/11/2015) 30 tablet 0   No current facility-administered medications for this visit.     Neurologic: Headache: Negative Seizure: Negative Paresthesias:Negative  Musculoskeletal: Strength & Muscle Tone: within normal limits Gait & Station: normal Patient leans: N/A  Psychiatric Specialty Exam: ROS Constitutional: Negative for fever, chills, weight loss, malaise/fatigue and diaphoresis.  Eyes: Negative for blurred vision, double vision, photophobia, pain, discharge and redness.  Respiratory: Negative for cough, hemoptysis, sputum production, shortness of breath and wheezing.   Cardiovascular: Negative for chest pain, palpitations, orthopnea, claudication and leg swelling.  Gastrointestinal: Negative for heartburn, nausea, vomiting, abdominal pain, diarrhea, constipation, blood in stool and melena.Pancreatitis in remission  Neurological: Negative for dizziness, tingling, tremors, sensory change, speech  change, focal weakness, seizures, loss of consciousness and weakness.  Psychiatric/Behavioral: Positive for depression in remission, memory loss stopped with sobriety and substance abuse hx as noted. Negative for suicidal ideas and hallucinations. The patient is not nervous/anxious on medication and has no insomnia continue MAT with Campral and Naltrexone   All other systems reviewed and are negative.  Blood pressure 114/66, pulse 71, height _0  (1.803 m), weight 202 lb 3.2 oz (91.7 kg).Body mass index is 28.2 kg/m.  General Appearance: Well Groomed  Eye Contact:  Good  Speech:  Clear and Coherent  Volume:  Normal  Mood:  Euthymic  Affect:  Congruent  Thought Process:  Coherent and Descriptions of Associations: Intact  Orientation:  Full (Time, Place, and Person)  Thought Content:  WDL and Logical  Suicidal Thoughts:  No  Homicidal Thoughts:  No  Memory:  Negative  Judgement:  Intact  Insight:  Present  Psychomotor Activity:  Normal  Concentration:  Concentration: Good  Recall:  Good  Fund of Knowledge:Good  Language: Good  Akathisia:  NA  Handed:  Right  AIMS (if indicated):  NA  Assets:  Communication Skills Desire for Improvement Financial Resources/Insurance Housing Resilience Social Support Transportation  ADL's:  Intact  Cognition: WNL  Sleep:  Restored    Treatment Plan Summary: Continue with CD IOP D/C plan;Aftercare program with [facility] and maintain current medications.FU 3 months   Darlyne Russian, PA-C 10/13/20172:09 PM

## 2015-10-11 NOTE — Progress Notes (Signed)
Daily Group Progress Note Program:  Aftercare  Group Time: 5:15-6:15 pm  Participation Level: Active  Behavioral Response: Appropriate  Type of Therapy:  Psychoeducation/Therapy  Summary of Progress: Pt participated in a discussion on being mindful in relationships. Pt discussed her relationship with her husband while in active addiction and recovery. Pt was encouraged to use effective communication skills and honesty in her relationships. Pt participated well during  the intervention.   Jerin Franzel S. Wyeth Hoffer, LCAS-A  

## 2015-10-12 ENCOUNTER — Ambulatory Visit (INDEPENDENT_AMBULATORY_CARE_PROVIDER_SITE_OTHER): Payer: BLUE CROSS/BLUE SHIELD | Admitting: Licensed Clinical Social Worker

## 2015-10-12 DIAGNOSIS — F1021 Alcohol dependence, in remission: Secondary | ICD-10-CM

## 2015-10-12 DIAGNOSIS — F331 Major depressive disorder, recurrent, moderate: Secondary | ICD-10-CM

## 2015-10-12 DIAGNOSIS — F411 Generalized anxiety disorder: Secondary | ICD-10-CM | POA: Diagnosis not present

## 2015-10-13 ENCOUNTER — Ambulatory Visit (INDEPENDENT_AMBULATORY_CARE_PROVIDER_SITE_OTHER): Payer: BLUE CROSS/BLUE SHIELD | Admitting: Licensed Clinical Social Worker

## 2015-10-13 DIAGNOSIS — F331 Major depressive disorder, recurrent, moderate: Secondary | ICD-10-CM | POA: Diagnosis not present

## 2015-10-13 DIAGNOSIS — F411 Generalized anxiety disorder: Secondary | ICD-10-CM | POA: Diagnosis not present

## 2015-10-13 DIAGNOSIS — F1021 Alcohol dependence, in remission: Secondary | ICD-10-CM | POA: Diagnosis not present

## 2015-10-14 ENCOUNTER — Encounter (HOSPITAL_COMMUNITY): Payer: Self-pay | Admitting: Licensed Clinical Social Worker

## 2015-10-14 NOTE — Progress Notes (Signed)
Daily Group Progress Note Program:  Aftercare  Group Time: 5:15-6:15 pm  Participation Level: Active  Behavioral Response: Appropriate  Type of Therapy:  Psychoeducation/Therapy  Summary of Progress: Pt participated in a discussion on finding balance in recovery. Pt was encouraged to continue his self/care, AA meetings, sponsor and step work, job and caring for his son. Pt needs to balance his life as becoming overwhelmed is a identified trigger to relapse. Pt participated well during the intervention.   Vernona RiegerLisbeth S. Roi Jafari, LCAS-A

## 2015-10-18 ENCOUNTER — Encounter (HOSPITAL_COMMUNITY): Payer: Self-pay | Admitting: Licensed Clinical Social Worker

## 2015-10-18 NOTE — Progress Notes (Signed)
   THERAPIST PROGRESS NOTE  Session Time: 4:10-5pm  Participation Level: Active  Behavioral Response: CasualAlert/Tearful  Type of Therapy: Individual Therapy  Treatment Goals addressed: Coping  Interventions: Supportive  Summary: Leory PlowmanJonathan N Neyra is a 35 y.o. male. Pt was tearful in describing his frustration with his parents, mother of his son, pending court cases, Pt reports he has not been using his breathing exercises nor meditation. Pt particiapted in a meditation on depression on youtube. Pt was encouraged to use meditations as part of his coping skills. Pt is looking for a new place for he and his son to live, to no avial. Pt is very frustrated with how his life has turned out and is very hard on himself. Encouraged pt to look at the positive areas he has in his life and to work on a Chemical engineergratitude list. Pt finally asked Barbara CowerJason to be his sponsor. Pt has issues with commitment and anticipation. Pt asked to work on these issues in therapy. Pt continues to go to 5-6 meetings per week but acknowleldges his schedule is full with work, gym, meetings, spending time with his son. Encouraged pt to have more time for self-care while still in early recovery.  Pt was receptive to suggestions.during the intervention.  Suicidal/Homicidal: Nowithout intent/plan  Therapist Response: Assessed pt's current functioning and reviewed progress. Assisted pt process issues with his family, his recovery, and processing for management of stressors.  Plan: Return again in 2 weeks.  Diagnosis: Axis I: F10.21, GAD        Ayelen Sciortino S, LCAS-A 10/18/2015

## 2015-10-19 ENCOUNTER — Ambulatory Visit (HOSPITAL_COMMUNITY): Payer: BLUE CROSS/BLUE SHIELD | Admitting: Licensed Clinical Social Worker

## 2015-10-20 ENCOUNTER — Ambulatory Visit (HOSPITAL_COMMUNITY): Payer: Self-pay | Admitting: Licensed Clinical Social Worker

## 2015-10-21 ENCOUNTER — Telehealth (HOSPITAL_COMMUNITY): Payer: Self-pay | Admitting: Licensed Clinical Social Worker

## 2015-10-26 ENCOUNTER — Ambulatory Visit (INDEPENDENT_AMBULATORY_CARE_PROVIDER_SITE_OTHER): Payer: BLUE CROSS/BLUE SHIELD | Admitting: Licensed Clinical Social Worker

## 2015-10-26 ENCOUNTER — Encounter (HOSPITAL_COMMUNITY): Payer: Self-pay | Admitting: Licensed Clinical Social Worker

## 2015-10-26 DIAGNOSIS — F411 Generalized anxiety disorder: Secondary | ICD-10-CM | POA: Diagnosis not present

## 2015-10-26 DIAGNOSIS — F1021 Alcohol dependence, in remission: Secondary | ICD-10-CM | POA: Diagnosis not present

## 2015-10-26 NOTE — Progress Notes (Signed)
Daily Group Progress Note Program:  Aftercare Group Time: 5:15-6:15 pm  Participation Level: Active  Behavioral Response: Appropriate  Type of Therapy:  Psychoeducation/Therapy Summary of Progress: Pt participated in a discussion on gratitude. Pt participated in a 7 day challenge for gratitude. He identified 3 gratitudes daily (no duplicates) for the purpose of "rescripting our negative self-story." Pt was encouraged to continue the challenge of gratitude to find positives in him.   Pt was receptive to intervention and suggestions.  Vernona RiegerLisbeth S. Sheika Coutts, LCAS

## 2015-10-27 ENCOUNTER — Ambulatory Visit (HOSPITAL_COMMUNITY): Payer: Self-pay | Admitting: Licensed Clinical Social Worker

## 2015-11-02 ENCOUNTER — Telehealth (HOSPITAL_COMMUNITY): Payer: Self-pay | Admitting: Licensed Clinical Social Worker

## 2015-11-02 ENCOUNTER — Ambulatory Visit (HOSPITAL_COMMUNITY): Payer: Self-pay | Admitting: Licensed Clinical Social Worker

## 2015-11-03 ENCOUNTER — Ambulatory Visit (HOSPITAL_COMMUNITY): Payer: Self-pay | Admitting: Licensed Clinical Social Worker

## 2015-11-04 ENCOUNTER — Ambulatory Visit (HOSPITAL_COMMUNITY): Payer: BLUE CROSS/BLUE SHIELD | Admitting: Licensed Clinical Social Worker

## 2015-11-04 ENCOUNTER — Telehealth (HOSPITAL_COMMUNITY): Payer: Self-pay | Admitting: Licensed Clinical Social Worker

## 2015-11-09 ENCOUNTER — Ambulatory Visit (HOSPITAL_COMMUNITY): Payer: Self-pay | Admitting: Licensed Clinical Social Worker

## 2015-11-16 ENCOUNTER — Telehealth (HOSPITAL_COMMUNITY): Payer: Self-pay | Admitting: Licensed Clinical Social Worker

## 2015-11-16 ENCOUNTER — Ambulatory Visit (INDEPENDENT_AMBULATORY_CARE_PROVIDER_SITE_OTHER): Payer: BLUE CROSS/BLUE SHIELD | Admitting: Licensed Clinical Social Worker

## 2015-11-16 DIAGNOSIS — F1021 Alcohol dependence, in remission: Secondary | ICD-10-CM

## 2015-11-16 DIAGNOSIS — F411 Generalized anxiety disorder: Secondary | ICD-10-CM | POA: Diagnosis not present

## 2015-11-17 ENCOUNTER — Encounter (HOSPITAL_COMMUNITY): Payer: Self-pay | Admitting: Licensed Clinical Social Worker

## 2015-11-17 NOTE — Progress Notes (Signed)
Daily Group Progress Note Program:  Aftercare   Group Time: 5:15-6:15 pm  Participation Level: Active  Behavioral Response: Appropriate  Type of Therapy:  Psychoeducation/Therapy Summary of Progress: Pt participated in a discussion on fear, a normal part of recovery. Pt agreed that the process of getting sober means having to replace his primary coping mechanism (alcohol) with new uncomfortable coping mechanisms. Pt was encouraged to continue working on his sobriety and may find, surprisingly, recovery is not as scary as he once thought. Pt was open to the intervention.    Vernona RiegerLisbeth S. Maezie Justin, LCAS

## 2015-11-23 ENCOUNTER — Ambulatory Visit (HOSPITAL_COMMUNITY): Payer: Self-pay | Admitting: Licensed Clinical Social Worker

## 2015-11-23 ENCOUNTER — Telehealth (HOSPITAL_COMMUNITY): Payer: Self-pay | Admitting: Licensed Clinical Social Worker

## 2015-11-24 ENCOUNTER — Ambulatory Visit (HOSPITAL_COMMUNITY): Payer: Self-pay | Admitting: Licensed Clinical Social Worker

## 2015-11-29 ENCOUNTER — Other Ambulatory Visit (HOSPITAL_COMMUNITY): Payer: Self-pay | Admitting: Medical

## 2015-11-30 ENCOUNTER — Ambulatory Visit (INDEPENDENT_AMBULATORY_CARE_PROVIDER_SITE_OTHER): Payer: BLUE CROSS/BLUE SHIELD | Admitting: Licensed Clinical Social Worker

## 2015-11-30 DIAGNOSIS — F1021 Alcohol dependence, in remission: Secondary | ICD-10-CM

## 2015-12-01 ENCOUNTER — Encounter (HOSPITAL_COMMUNITY): Payer: Self-pay | Admitting: Licensed Clinical Social Worker

## 2015-12-01 ENCOUNTER — Ambulatory Visit (HOSPITAL_COMMUNITY): Payer: Self-pay | Admitting: Licensed Clinical Social Worker

## 2015-12-01 ENCOUNTER — Other Ambulatory Visit (HOSPITAL_COMMUNITY): Payer: Self-pay | Admitting: Medical

## 2015-12-01 MED ORDER — PROPRANOLOL HCL 20 MG PO TABS: ORAL_TABLET | ORAL | 2 refills | Status: DC

## 2015-12-01 NOTE — Progress Notes (Signed)
Daily Group Progress Note Program:  Aftercare   Group Time: 5:15-6:15 pm  Participation Level: Active  Behavioral Response: Appropriate  Type of Therapy:  Psychoeducation/Therapy  Summary of Progress: Pt participated in a discussion on relationships in recovery. Pt completed an activity on "The Relationship Circle": intimate, close, and acquaintances. A discussion ensued on the placement in the circle of people allowed in his life. The discussion continued where patient showed how some people move around in different circles so that he may protect himself. Pt was encouraged to continue to use boundaries with people in his life for healthy self-care   Vernona RiegerLisbeth S. [name], LCAS 11/30/15

## 2015-12-01 NOTE — Progress Notes (Unsigned)
   THERAPIST PROGRESS NOTE  Session Time: 4:10-5pm  Participation Level: Active  Behavioral Response: CasualAlert/Tearful  Type of Therapy: Individual Therapy  Treatment Goals addressed: Coping  Interventions: Supportive  Summary: Ian PlowmanJonathan N Obryant is a 35 y.o. male. Pt was tearful in describing his frustration with his parents, mother of his son, pending court cases, Pt reports he has not been using his breathing exercises nor meditation. Pt particiapted in a meditation on depression on youtube. Pt was encouraged to use meditations as part of his coping skills. Pt is looking for a new place for he and his son to live, to no avial. Pt is very frustrated with how his life has turned out and is very hard on himself. Encouraged pt to look at the positive areas he has in his life and to work on a Chemical engineergratitude list. Pt finally asked Barbara CowerJason to be his sponsor. Pt has issues with commitment and anticipation. Pt asked to work on these issues in therapy. Pt continues to go to 5-6 meetings per week but acknowleldges his schedule is full with work, gym, meetings, spending time with his son. Encouraged pt to have more time for self-care while still in early recovery.  Pt was receptive to suggestions.during the intervention.  Suicidal/Homicidal: Nowithout intent/plan  Therapist Response: Assessed pt's current functioning and reviewed progress. Assisted pt process issues with his family, his recovery, and processing for management of stressors.  Plan: Return again in 2 weeks.  Diagnosis: Axis I: F10.21, GAD        [name],[name], LCAS 12/01/2015

## 2015-12-06 ENCOUNTER — Ambulatory Visit (INDEPENDENT_AMBULATORY_CARE_PROVIDER_SITE_OTHER): Payer: BLUE CROSS/BLUE SHIELD | Admitting: Licensed Clinical Social Worker

## 2015-12-06 ENCOUNTER — Other Ambulatory Visit (HOSPITAL_COMMUNITY): Payer: Self-pay | Admitting: Medical

## 2015-12-06 DIAGNOSIS — F331 Major depressive disorder, recurrent, moderate: Secondary | ICD-10-CM | POA: Diagnosis not present

## 2015-12-06 DIAGNOSIS — F1021 Alcohol dependence, in remission: Secondary | ICD-10-CM

## 2015-12-06 DIAGNOSIS — F411 Generalized anxiety disorder: Secondary | ICD-10-CM

## 2015-12-06 MED ORDER — PROPRANOLOL HCL 20 MG PO TABS: ORAL_TABLET | ORAL | 2 refills | Status: DC

## 2015-12-07 ENCOUNTER — Encounter (HOSPITAL_COMMUNITY): Payer: Self-pay | Admitting: Licensed Clinical Social Worker

## 2015-12-07 NOTE — Progress Notes (Signed)
   THERAPIST PROGRESS NOTE  Session Time: 4:10-4:40pm  Participation Level: Active  Behavioral Response: CasualAlert/Tearful  Type of Therapy: Individual Therapy  Treatment Goals addressed: Coping  Interventions: Supportive  Summary: Leory PlowmanJonathan N Brine is a 35 y.o. male. Pt has accepted the current events in his life, including his family and recovery. Pt is willing to listen to suggestions but rarely does anything different. He still goes to  Meetings but no sponsor. He shares he is lonely but [place] to anyone in [place]. He is working a lot which we processed. Work is a past trigger for him (being tired). Pt has been encouraged to use meditation for coping with stress but he doesn't get around to using it. Pt is very hard on himself which leads to less self-esteem. A gratitude list was suggested to the pt to see all the positive things in his life. He never got around to it. Pt acknowledges commitment issues but doesn't know what he is afraid of. Pt is ruminating about the loss of his parents who are in good health but getting older. He feels that he has been a burden to them. He and his son bring joy to his parents but sometimes frustration when pt does follow through and seems to lack motivation to become self sufficient. Pt tearfully shared his father had contacted him over thanksgiving by text. He would like to have a better relationship with his biological father. His homework assignment is to write a letter to his father and we will process it at next session.         Suicidal/Homicidal: Nowithout intent/plan  Therapist Response: Assessed pt's current functioning and reviewed progress. Assisted pt process issues with his family, his recovery, and processing for management of stressors.  Plan: Return again in 2 weeks.  Diagnosis: Axis I: F10.21, GAD        Quindon Denker S, LCAS 12/07/2015

## 2015-12-08 ENCOUNTER — Telehealth (HOSPITAL_COMMUNITY): Payer: Self-pay | Admitting: Licensed Clinical Social Worker

## 2015-12-14 ENCOUNTER — Telehealth (HOSPITAL_COMMUNITY): Payer: Self-pay | Admitting: Licensed Clinical Social Worker

## 2015-12-15 ENCOUNTER — Ambulatory Visit (HOSPITAL_COMMUNITY): Payer: Self-pay | Admitting: Licensed Clinical Social Worker

## 2015-12-22 ENCOUNTER — Ambulatory Visit (INDEPENDENT_AMBULATORY_CARE_PROVIDER_SITE_OTHER): Payer: BLUE CROSS/BLUE SHIELD | Admitting: Licensed Clinical Social Worker

## 2015-12-22 ENCOUNTER — Telehealth (HOSPITAL_COMMUNITY): Payer: Self-pay | Admitting: Licensed Clinical Social Worker

## 2015-12-22 DIAGNOSIS — F1020 Alcohol dependence, uncomplicated: Secondary | ICD-10-CM | POA: Diagnosis not present

## 2015-12-23 ENCOUNTER — Emergency Department (HOSPITAL_COMMUNITY)
Admission: EM | Admit: 2015-12-23 | Discharge: 2015-12-24 | Disposition: A | Discharge status: Other Acute Inpatient Hospital | Payer: BLUE CROSS/BLUE SHIELD | Attending: Emergency Medicine | Admitting: Emergency Medicine

## 2015-12-23 ENCOUNTER — Ambulatory Visit (HOSPITAL_COMMUNITY): Payer: BLUE CROSS/BLUE SHIELD | Admitting: Licensed Clinical Social Worker

## 2015-12-23 ENCOUNTER — Encounter (HOSPITAL_COMMUNITY): Payer: Self-pay | Admitting: Emergency Medicine

## 2015-12-23 ENCOUNTER — Encounter (HOSPITAL_COMMUNITY): Payer: Self-pay | Admitting: Licensed Clinical Social Worker

## 2015-12-23 DIAGNOSIS — J45909 Unspecified asthma, uncomplicated: Secondary | ICD-10-CM | POA: Diagnosis not present

## 2015-12-23 DIAGNOSIS — Z87891 Personal history of nicotine dependence: Secondary | ICD-10-CM | POA: Diagnosis not present

## 2015-12-23 DIAGNOSIS — F10920 Alcohol use, unspecified with intoxication, uncomplicated: Secondary | ICD-10-CM

## 2015-12-23 DIAGNOSIS — F1010 Alcohol abuse, uncomplicated: Secondary | ICD-10-CM

## 2015-12-23 DIAGNOSIS — F1020 Alcohol dependence, uncomplicated: Secondary | ICD-10-CM

## 2015-12-23 DIAGNOSIS — I10 Essential (primary) hypertension: Secondary | ICD-10-CM | POA: Diagnosis not present

## 2015-12-23 DIAGNOSIS — F10120 Alcohol abuse with intoxication, uncomplicated: Secondary | ICD-10-CM | POA: Insufficient documentation

## 2015-12-23 DIAGNOSIS — Z79899 Other long term (current) drug therapy: Secondary | ICD-10-CM | POA: Diagnosis not present

## 2015-12-23 MED ORDER — VITAMIN B-1 100 MG PO TABS: 100.0000 mg | ORAL_TABLET | Freq: Once | ORAL | Status: DC

## 2015-12-23 MED ORDER — LORAZEPAM 2 MG/ML IJ SOLN
1.0000 mg | Freq: Once | INTRAMUSCULAR | Status: AC
  Administered 2015-12-24: 1 mg via INTRAVENOUS
  Filled 2015-12-23: qty 1

## 2015-12-23 MED ORDER — SODIUM CHLORIDE 0.9 % IV BOLUS (SEPSIS)
1000.0000 mL | Freq: Once | INTRAVENOUS | Status: AC
  Administered 2015-12-23: 1000 mL via INTRAVENOUS

## 2015-12-23 NOTE — ED Provider Notes (Signed)
[facility]Y DEPT Provider Note   CSN: [id number] Arrival date & time: 12/23/15  1531     History   Chief Complaint Chief Complaint  Patient presents with  . Alcohol Problem    HPI Ian [name] is a 34 y.o. male.  35 year old with a past medical history significant for anxiety, depression, alcohol abuse that presents to the ED today with alcohol intoxication brought in by his counselor. Patient states that he's been sober 1 year. However he has been drinking for past 6 weeks at variable amounts. Today he has had a half a gallon of vodka over the past 24 hours. States last week about 3 gallons of vodka. States that his counselor brought him in and wanted him to get help and resources. He denies any SI or HI. Patient does state that between his drinking last week he was experiencing some auditory of visual hallucinations. States that he has been talking to people that are not there. Patient does have a history of depression and anxiety and states that this is causing him to drink more. He has been placed in [facility] before for hallucinations and alcohol detox. States his last drink was approximately 3 hours ago. He denies any other complaints including fever, chills, headache, vision changes, lightheadedness, dizziness, cough, chest pain, shortness of breath, abdominal pain, nausea, emesis, urinary symptoms, change in bowel habits, numbness/tingling.      Past Medical History:  Diagnosis Date  . Alcohol dependence (HCC)   . Anxiety   . Asthma   . Depression   . Hypertension   . Pancreatitis     Patient Active Problem List   Diagnosis Date Noted  . Cellulitis and abscess 02/15/2015  . Peritoneal cyst 02/15/2015  . Substance induced mood disorder (HCC) 02/08/2015  . Alcoholic pancreatitis 02/08/2015  . Pancreatitis, alcoholic, acute 02/08/2015  . H/O acute alcoholic hepatitis 02/08/2015  . 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 Alcohol  dependence, daily use (HCC) 02/03/2015  . Alcohol intoxication in alcoholism with blood level over 0.3 with complication (HCC) 02/03/2015  . Hx of pancreatitis 01/19/2015  . Prolonged Q-T interval on ECG 01/19/2015  . Alcohol use disorder, severe, dependence (HCC) 08/23/2014  . GAD (generalized anxiety disorder) 08/14/2013  . Major depressive disorder, recurrent episode, moderate (HCC) 08/14/2013  . ASTHMA 07/31/2008    Past Surgical History:  Procedure Laterality Date  . NO PAST SURGERIES         Home Medications    Prior to Admission medications   Medication Sig Start Date End Date Taking? Authorizing Provider  acamprosate (CAMPRAL) 333 MG tablet Take 2 tablets (666 mg total) by mouth 3 (three) times daily. 10/11/15   Court Joy, PA-C  albuterol (PROAIR HFA) 108 [id number] Base) MCG/ACT inhaler Inhale 1-2 puffs into the lungs every 4 (four) hours as needed for wheezing.  01/04/15   Historical Provider, MD  doxycycline (VIBRAMYCIN) 100 MG capsule Take 100 mg by mouth 2 (two) times daily.    Historical Provider, MD  hydrOXYzine (VISTARIL) 50 MG capsule Take 1 capsule (50 mg total) by mouth 4 (four) times daily as needed for anxiety. 10/11/15   Court Joy, PA-C  lisinopril (PRINIVIL,ZESTRIL) 5 MG tablet Take 5 mg by mouth daily. 01/14/15   Historical Provider, MD  mometasone-formoterol (DULERA) 100-5 MCG/ACT AERO Inhale 2 puffs into the lungs 2 (two) times daily. 01/21/15   Adonis Brook, NP  mupirocin cream (BACTROBAN) 2 % Apply 1 application topically 3 (three)  times daily. Apply to affected area on nose Patient not taking: Reported on 10/11/2015 02/17/15   Court Joyharles E Kober, PA-C  mupirocin ointment (BACTROBAN) 2 % Place 1 application into the nose 3 (three) times daily. Patient not taking: Reported on 10/11/2015 02/18/15   Court Joyharles E Kober, PA-C  naltrexone (DEPADE) 50 MG tablet Take 1 tablet (50 mg total) by mouth daily. 10/11/15   Court Joyharles E Kober, PA-C  propranolol (INDERAL) 20 MG tablet  Take 1 tablet 1/2 hour before bedtime meds 12/06/15   Court Joyharles E Kober, PA-C  QUEtiapine (SEROQUEL) 25 MG tablet Take 1-2 tablets QHS 10/11/15   Court Joyharles E Kober, PA-C  sulfamethoxazole-trimethoprim (BACTRIM DS,SEPTRA DS) 800-160 MG tablet Take 1 tablet by mouth 2 (two) times daily. Patient not taking: Reported on 10/11/2015 04/21/15   Court Joyharles E Kober, PA-C    Family History Family History  Problem Relation Age of Onset  . OCD Mother   . Heart disease Mother   . Suicidality Neg Hx     Social History Social History  Substance Use Topics  . Smoking status: Former Smoker    Types: Cigarettes    Quit date: 01/31/1999  . Smokeless tobacco: Never Used  . Alcohol use 36.0 oz/week    60 Shots of liquor per week     Comment: varies how much     Allergies   Patient has no known allergies.   Review of Systems Review of Systems  Constitutional: Negative for chills and fever.  HENT: Negative for congestion, ear pain, rhinorrhea and sore throat.   Eyes: Negative for pain and discharge.  Respiratory: Negative for cough and shortness of breath.   Cardiovascular: Negative for chest pain and palpitations.  Gastrointestinal: Negative for abdominal pain, diarrhea, nausea and vomiting.  Genitourinary: Negative for flank pain, frequency, hematuria and urgency.  Musculoskeletal: Negative for myalgias and neck pain.  Neurological: Negative for dizziness, syncope, weakness, light-headedness, numbness and headaches.  Psychiatric/Behavioral: Negative for suicidal ideas.  All other systems reviewed and are negative.    Physical Exam Updated Vital Signs BP 111/81 (BP Location: Right Arm)   Pulse 108   Temp 98 F (36.7 C) (Oral)   Resp 18   Ht 5' 10.5" (1.791 m)   Wt 86.9 kg   SpO2 96%   BMI 27.10 kg/m   Physical Exam  Constitutional: He is oriented to person, place, and time. He appears well-developed and well-nourished. No distress.  Patient appears unsteady on feet. Patient appears  intoxicated and reactions are slowed.  HENT:  Head: Normocephalic and atraumatic.  Nose: Nose normal.  Mouth/Throat: Uvula is midline, oropharynx is clear and moist and mucous membranes are normal.  Eyes: Conjunctivae are normal. Pupils are equal, round, and reactive to light. Right eye exhibits no discharge. Left eye exhibits no discharge. No scleral icterus.  Pupils dilated and conjunctivae are injected.  Neck: Normal range of motion. Neck supple. No thyromegaly present.  Cardiovascular: Regular rhythm, normal heart sounds and intact distal pulses.  Tachycardia present.  Exam reveals no gallop and no friction rub.   No murmur heard. Likely due to alcohol intoxication. Denies any cp.  Pulmonary/Chest: Effort normal and breath sounds normal. No respiratory distress.  Abdominal: Soft. Bowel sounds are normal. He exhibits no distension. There is no tenderness. There is no rebound and no guarding.  Musculoskeletal: Normal range of motion.  Moving all 4 extremities without difficulties. No calf tenderness, no lower extremity edema.  Lymphadenopathy:    He has no cervical adenopathy.  Neurological: He is alert and oriented to person, place, and time. GCS eye subscore is 4. GCS verbal subscore is 5. GCS motor subscore is 6.  The patient is alert, attentive, and oriented x 3. Speech is clear. Cranial nerve II-VII grossly intact. Negative pronator drift. Sensation intact. Strength 5/5 in all extremities. Reflexes 2+ and symmetric at biceps, triceps, knees, and ankles. Rapid alternating movement and fine finger movements intact.    Skin: Skin is warm and dry. Capillary refill takes less than 2 seconds.  Nursing note and vitals reviewed.    ED Treatments / Results  Labs (all labs ordered are listed, but only abnormal results are displayed) Labs Reviewed  ETHANOL - Abnormal; Notable for the following:       Result Value   Alcohol, Ethyl (B) 380 (*)    All other components within normal limits      EKG  EKG Interpretation None       Radiology No results found.  Procedures Procedures (including critical care time)  Medications Ordered in ED Medications  thiamine (VITAMIN B-1) tablet 100 mg (not administered)  sodium chloride 0.9 % bolus 1,000 mL (1,000 mLs Intravenous New Bag/Given 12/23/15 1908)     Initial Impression / Assessment and Plan / ED Course  I have reviewed the triage vital signs and the nursing notes.  Pertinent labs & imaging results that were available during my care of the patient were reviewed by me and considered in my medical decision making (see chart for details).  Clinical Course   Patient presents to the ED for alcohol intoxication and wanting detox. He also admits to having auditory and visual hallucinations during his drinking bouts last week. He denies any SI/HI. He is brought in by his counselor states that he needs help. [facility] and alcohol detox. Ethanol level was 380. Patient is slightly tachycardic likely due to alcohol intoxication. He denies any chest pain or shortness of breath. He was given a liter fluid due to possible dehydration. Patient with history of alcohol abuse and has been sober for the past year. He has no other complaints at this time. The patient is unsteady on feet. He is sleeping on the stretcher. He was eating a sandwich prior to my assessment. We'll consult TTS for patient's hallucinations. Likely discharge home after clinically sober with outpatient resources. Care hand off to PA [place] pending tts recs and being clinically sober. Dicussed patient with Dr. Jeraldine LootsLockwood who is agreeable to the above plan.  Final Clinical Impressions(s) / ED Diagnoses   Final diagnoses:  Alcoholic intoxication without complication (HCC)  Alcohol abuse    New Prescriptions New Prescriptions   No medications on file     Wallace KellerKenneth T Johnathon Olden, PA-C 12/23/15 2049    Gerhard Munchobert  Lockwood, MD 12/23/15 2352

## 2015-12-23 NOTE — ED Triage Notes (Addendum)
Pt drank 1/2 gallon of vodka over the past 24 hours. Pt was sober x 1 year. Has been drinking for the past 6 weeks, variable amounts. Last week drank about 3 gallons of vodka. No SI/HI/AVH. Hx depression and anxiety.   Last drink 3 hours ago.

## 2015-12-23 NOTE — Progress Notes (Signed)
   THERAPIST PROGRESS NOTE  Session Time: 4:15-5pm  Participation Level: Active  Behavioral Response: DisheveledAlertDepressed  Type of Therapy: Individual Therapy  Treatment Goals addressed: Coping  Interventions: Supportive  Summary: Ian [name] is a 35 y.o. male who presents with alcohol use disorder. Pt reports he relapsed and had a drink about six weeks ago and has slowly started drinking more. Last week he drank all week, went on leave from his job and lied to his son that he was working all the time. Discussed options with pt: detox, CDIOP. Pt's mother went to his house and emptied all the liquor in his house. Pt will go to a meeting tonight and think of his alternatives. Recommended pt go to ED for clearance or detox.Made appt for pt to return tomorrow for appt. Pt was in agreement to suggestions but will make his decision.  Suicidal/Homicidal: Nowithout intent/plan  Therapist Response: Processed alternatives with pt after his relapse.  Plan: Return again tomorrow.  Diagnosis: Axis I: Alcohol use disorder, severe        Eiley Mcginnity S, LCAS 12/23/2015

## 2015-12-23 NOTE — ED Notes (Signed)
PT EATING A SANDWICH AND DRINKING WATER.

## 2015-12-24 ENCOUNTER — Inpatient Hospital Stay (HOSPITAL_COMMUNITY)
Admission: AD | Admit: 2015-12-24 | Discharge: 2015-12-28 | DRG: 897 | Disposition: A | Discharge status: Home / Self Care | Payer: BLUE CROSS/BLUE SHIELD | Source: Intra-hospital | Attending: Psychiatry | Admitting: Psychiatry

## 2015-12-24 ENCOUNTER — Encounter (HOSPITAL_COMMUNITY): Payer: Self-pay

## 2015-12-24 DIAGNOSIS — F10280 Alcohol dependence with alcohol-induced anxiety disorder: Secondary | ICD-10-CM | POA: Diagnosis not present

## 2015-12-24 DIAGNOSIS — F10120 Alcohol abuse with intoxication, uncomplicated: Secondary | ICD-10-CM | POA: Diagnosis not present

## 2015-12-24 DIAGNOSIS — Z8489 Family history of other specified conditions: Secondary | ICD-10-CM | POA: Diagnosis not present

## 2015-12-24 DIAGNOSIS — F10239 Alcohol dependence with withdrawal, unspecified: Secondary | ICD-10-CM | POA: Diagnosis not present

## 2015-12-24 DIAGNOSIS — F17210 Nicotine dependence, cigarettes, uncomplicated: Secondary | ICD-10-CM | POA: Diagnosis present

## 2015-12-24 DIAGNOSIS — I10 Essential (primary) hypertension: Secondary | ICD-10-CM | POA: Diagnosis not present

## 2015-12-24 DIAGNOSIS — Z79899 Other long term (current) drug therapy: Secondary | ICD-10-CM

## 2015-12-24 DIAGNOSIS — F1020 Alcohol dependence, uncomplicated: Secondary | ICD-10-CM | POA: Diagnosis present

## 2015-12-24 DIAGNOSIS — F1094 Alcohol use, unspecified with alcohol-induced mood disorder: Secondary | ICD-10-CM | POA: Diagnosis present

## 2015-12-24 DIAGNOSIS — F10180 Alcohol abuse with alcohol-induced anxiety disorder: Secondary | ICD-10-CM | POA: Diagnosis not present

## 2015-12-24 DIAGNOSIS — Z8249 Family history of ischemic heart disease and other diseases of the circulatory system: Secondary | ICD-10-CM | POA: Diagnosis not present

## 2015-12-24 DIAGNOSIS — F411 Generalized anxiety disorder: Secondary | ICD-10-CM | POA: Diagnosis not present

## 2015-12-24 DIAGNOSIS — Y908 Blood alcohol level of 240 mg/100 ml or more: Secondary | ICD-10-CM | POA: Diagnosis present

## 2015-12-24 DIAGNOSIS — F10129 Alcohol abuse with intoxication, unspecified: Secondary | ICD-10-CM | POA: Clinically undetermined

## 2015-12-24 DIAGNOSIS — Z23 Encounter for immunization: Secondary | ICD-10-CM

## 2015-12-24 DIAGNOSIS — Z818 Family history of other mental and behavioral disorders: Secondary | ICD-10-CM | POA: Diagnosis not present

## 2015-12-24 LAB — ETHANOL: Alcohol, Ethyl (B): 380 mg/dL (ref <5)

## 2015-12-24 MED ORDER — ACETAMINOPHEN 325 MG PO TABS: 650.0000 mg | ORAL_TABLET | Freq: Four times a day (QID) | ORAL | Status: DC | PRN

## 2015-12-24 MED ORDER — MAGNESIUM HYDROXIDE 400 MG/5ML PO SUSP: 30.0000 mL | Freq: Every day | ORAL | Status: DC | PRN

## 2015-12-24 MED ORDER — ALUM & MAG HYDROXIDE-SIMETH 200-200-20 MG/5ML PO SUSP: 30.0000 mL | ORAL | Status: DC | PRN

## 2015-12-24 MED ORDER — LORAZEPAM 1 MG PO TABS: 0.0000 mg | ORAL_TABLET | Freq: Two times a day (BID) | ORAL | Status: DC

## 2015-12-24 MED ORDER — PROPRANOLOL HCL 20 MG PO TABS
20.0000 mg | ORAL_TABLET | Freq: Every day | ORAL | Status: DC
  Administered 2015-12-24: 20 mg via ORAL
  Filled 2015-12-24: qty 2
  Filled 2015-12-24 (×2): qty 1

## 2015-12-24 MED ORDER — LORAZEPAM 1 MG PO TABS
0.0000 mg | ORAL_TABLET | Freq: Four times a day (QID) | ORAL | Status: AC
  Administered 2015-12-24: 1 mg via ORAL
  Administered 2015-12-25: 2 mg via ORAL
  Administered 2015-12-25 (×3): 1 mg via ORAL
  Filled 2015-12-24 (×3): qty 1
  Filled 2015-12-24: qty 2
  Filled 2015-12-24: qty 1

## 2015-12-24 MED ORDER — PNEUMOCOCCAL VAC POLYVALENT 25 MCG/0.5ML IJ INJ: 0.5000 mL | INJECTION | INTRAMUSCULAR | Status: DC

## 2015-12-24 MED ORDER — LORAZEPAM 1 MG PO TABS
0.0000 mg | ORAL_TABLET | Freq: Two times a day (BID) | ORAL | Status: DC
  Administered 2015-12-26 (×2): 1 mg via ORAL
  Filled 2015-12-24 (×2): qty 1

## 2015-12-24 MED ORDER — LORAZEPAM 1 MG PO TABS
0.0000 mg | ORAL_TABLET | Freq: Four times a day (QID) | ORAL | Status: DC
  Administered 2015-12-24: 1 mg via ORAL
  Filled 2015-12-24: qty 1

## 2015-12-24 NOTE — BH Assessment (Addendum)
Tele Assessment Note   Ian PlowmanJonathan N [name] is an 35 y.o. male.  -Clinician reviewed note by Ian HeaterKenneth Lephart PA.  Pt is a 35 year old with a past medical history significant for anxiety, depression, alcohol abuse that presents to the ED today with alcohol intoxication brought in by his counselor. Patient states that he's been sober 1 year. However he has been drinking for past 6 weeks at variable amounts. Today he has had a half a gallon of vodka over the past 24 hours. States last week about 3 gallons of vodka. States that his counselor brought him in and wanted him to get help and resources. He denies any SI or HI. Patient does state that between his drinking last week he was experiencing some auditory of visual hallucinations. States that he has been talking to people that are not there. Patient does have a history of depression and anxiety and states that this is causing him to drink more.  Patient says that he has been drinking at least a quarter gallon of vodka per day for the last 2 weeks.  He has been drinking over the last 6 weeks after nearly a year sober.  [name]  Patient has no SI or HI.  He does say that he "saw some weird stuff" when he went for a day without drinking.  Patient has a history of SI and was at [name] in January '17.  Patient is followed by Ian Mornharles Kober, PA at [name] and sees counselors there.  He said that he would like to continue to follow up with them and is okay with coming in for inpatient care if that is recommended.  -Clinician discussed patient care with Ian ConnJason Berry, FNP.  He recommended an AM psych eval.  Diagnosis: MDD recurrent, severe; ETOH use d/o severe  Past Medical History:  Past Medical History:  Diagnosis Date  . Alcohol dependence (HCC)   . Anxiety   . Asthma   . Depression   . Hypertension   . Pancreatitis     Past Surgical History:  Procedure Laterality Date  . NO PAST  SURGERIES      Family History:  Family History  Problem Relation Age of Onset  . OCD Mother   . Heart disease Mother   . Suicidality Neg Hx     Social History:  reports that he quit smoking about 16 years ago. His smoking use included Cigarettes. He has never used smokeless tobacco. He reports that he drinks about 36.0 oz of alcohol per week . He reports that he does not use drugs.  Additional Social History:  Alcohol / Drug Use Pain Medications: See PTA medication list Prescriptions: See PtA medication list.  Pt says he has not been taking medication as he should. Over the Counter: None History of alcohol / drug use?: Yes Longest period of sobriety (when/how long): Pt was coming up on a year this month. Negative Consequences of Use: Personal relationships Withdrawal Symptoms: Agitation, Tremors, Nausea / Vomiting, Diarrhea, Patient aware of relationship between substance abuse and physical/medical complications, Sweats, Fever / Chills Substance #1 Name of Substance 1: ETOH 1 - Age of First Use: Teens 1 - Amount (size/oz): 1/4 of a gallon of vodka per day 1 - Frequency: Daily use 1 - Duration: Started back two weeks ago 1 - Last Use / Amount: 12/14 about a quarter of a gallon  CIWA: CIWA-Ar BP: 109/86 Pulse Rate: 95 Nausea and Vomiting:  no nausea and no vomiting Tactile Disturbances: none Tremor: no tremor Auditory Disturbances: not present Paroxysmal Sweats: no sweat visible Visual Disturbances: not present Anxiety: no anxiety, at ease Headache, Fullness in Head: none present Agitation: normal activity Orientation and Clouding of Sensorium: oriented and can do serial additions CIWA-Ar Total: 0 COWS:    PATIENT STRENGTHS: (choose at least two) Ability for insight Average or above average intelligence Capable of independent living Motivation for treatment/growth  Allergies: No Known Allergies  Home Medications:  (Not in a hospital admission)  OB/GYN Status:  No  LMP for male patient.  General Assessment Data [facility] of Assessment: [facility] TTS Assessment: In system Is this a Tele or Face-to-Face Assessment?: Face-to-Face Is this an Initial Assessment or a Re-assessment for this encounter?: Initial Assessment Marital status: Single Is patient pregnant?: No Pregnancy Status: No Living Arrangements: Alone Can pt return to current living arrangement?: Yes Admission Status: Voluntary Is patient capable of signing voluntary admission?: Yes Referral Source: Psychiatrist [facility]      Crisis Care Plan Living Arrangements: Alone Name of Psychiatrist: Maryjean Mornharles [name], [place] Name of Therapist[name]  Education Status Is patient currently in school?: No Highest grade of school patient has completed: BS  Risk to self with the past 6 months Suicidal Ideation: No Has patient been a risk to self within the past 6 months prior to admission? : No Suicidal Intent: No Has patient had any suicidal intent within the past 6 months prior to admission? : No Is patient at risk for suicide?: No Suicidal Plan?: No Has patient had any suicidal plan within the past 6 months prior to admission? : No Access to Means: No What has been your use of drugs/alcohol within the last 12 months?: ETOH Previous Attempts/Gestures: No How many times?: 0 Other Self Harm Risks: None Triggers for Past Attempts: None known Intentional Self Injurious Behavior: None Family Suicide History: No Recent stressful life event(s): Other (Comment) (No preceedent events identified) Persecutory voices/beliefs?: No Depression: Yes Depression Symptoms: Despondent, Guilt, Feeling worthless/self pity Substance abuse history and/or treatment for substance abuse?: Yes Suicide prevention information given to non-admitted patients: Not applicable  Risk to Others within the past 6 months Homicidal Ideation: No Does patient have any lifetime risk of  violence toward others beyond the six months prior to admission? : No Thoughts of Harm to Others: No Current Homicidal Intent: No Current Homicidal Plan: No Access to Homicidal Means: No Identified Victim: No one History of harm to others?: No Assessment of Violence: In distant past Violent Behavior Description: None Does patient have access to weapons?: No Criminal Charges Pending?: No Does patient have a court date: No Is patient on probation?: No  Psychosis Hallucinations: None noted Delusions: None noted  Mental Status Report Appearance/Hygiene: Unremarkable, In hospital gown Eye Contact: Good Motor Activity: Freedom of movement, Unremarkable Speech: Logical/coherent Level of Consciousness: Alert Mood: Depressed, Helpless Affect: Anxious, Depressed, Sad Anxiety Level: Moderate Thought Processes: Coherent, Relevant Judgement: Impaired Orientation: Person, Place, Time, Situation Obsessive Compulsive Thoughts/Behaviors: None  Cognitive Functioning Concentration: Normal Memory: Recent Intact, Remote Intact IQ: Average Insight: Good Impulse Control: Poor Appetite: Fair Weight Loss: 0 Weight Gain: 0 Sleep: No Change Total Hours of Sleep: 6 Vegetative Symptoms: None  ADLScreening [facility]) Patient's cognitive ability adequate to safely complete daily activities?: Yes Patient able to express need for assistance with ADLs?: Yes Independently performs ADLs?: Yes (appropriate for developmental age)  Prior Inpatient Therapy Prior Inpatient  Therapy: Yes Prior Therapy Dates: 2016  Prior Therapy Facilty/Provider(s): [facility] Reason for Treatment: hallucinations  Prior Outpatient Therapy Prior Outpatient Therapy: Yes Prior Therapy Dates: For the last year Prior Therapy Facilty/Provider(s): [facility] Reason for Treatment: outpatient Does patient have an ACCT team?: No Does patient have Intensive In-House Services?  : No Does patient have [facility] services? :  No Does patient have P4CC services?: No  ADL Screening (condition at time of admission) Patient's cognitive ability adequate to safely complete daily activities?: Yes Is the patient deaf or have difficulty hearing?: No Does the patient have difficulty seeing, even when wearing glasses/contacts?: No (Pt wears glass) Does the patient have difficulty concentrating, remembering, or making decisions?: No Patient able to express need for assistance with ADLs?: Yes Does the patient have difficulty dressing or bathing?: No Independently performs ADLs?: Yes (appropriate for developmental age) Does the patient have difficulty walking or climbing stairs?: No Weakness of Legs: None Weakness of Arms/Hands: None       Abuse/Neglect Assessment (Assessment to be complete while patient is alone) Physical Abuse: Denies Verbal Abuse: Denies Sexual Abuse: Denies Exploitation of patient/patient's resources: Denies Self-Neglect: Denies     Merchant navy officer (For Healthcare) Does Patient Have a Medical Advance Directive?: No    Additional Information 1:1 In Past 12 Months?: No CIRT Risk: No Elopement Risk: Yes     Disposition:  Disposition Initial Assessment Completed for this Encounter: Yes Disposition of Patient: Other dispositions Other disposition(s): Other (Comment) (Pt to be reviewed with NP)  Beatriz Stallion Ray 12/24/2015 1:10 AM

## 2015-12-24 NOTE — ED Notes (Signed)
Pt is changing into purple scrubs

## 2015-12-24 NOTE — Progress Notes (Signed)
12/24/15 1336:  LRT went to pt room to offer activities, pt was sleep.  Caroll RancherMarjette Natayah Warmack, LRT/CTRS

## 2015-12-24 NOTE — Progress Notes (Signed)
Ian [name] is a 35 year old male being admitted voluntarily to [facility] from [facility].  He came to the ED for anxiety, depression and substance abuse.  He reported that he relapsed the end of October and has his alcohol consumption has worsened over the past three weeks.  He hasn't been able to work.  "I am very upset with myself."  He denies SI/HI or A/V hallucinations.  He denies any pain or discomfort and appears to be in no physical distress.  Oriented him to the unit.  Admission paperwork completed and signed.  Belongings searched and secured in locker # 20.  Skin assessment completed and no skin issues noted.  Q 15 minute checks initiated for safety.  We will monitor the progress towards his goals.

## 2015-12-24 NOTE — Progress Notes (Signed)
Patient did not attend AA group meeting. 

## 2015-12-24 NOTE — ED Notes (Signed)
With shift assessment pt denies SI/HI or A/VH. Pt alert to self, place, situation, and time.

## 2015-12-24 NOTE — BH Assessment (Signed)
[facility] Assessment Progress Note  Per Thedore MinsMojeed Akintayo, MD, this pt requires psychiatric hospitalization at this time.  Ian Heinrichina Tate, RN, [facility].  Pt has signed Voluntary Admission and Consent for Treatment, as well as Consent to Release Information to Charmian MuffAnn Evans, and she has been notified by e-mail.  [facility].  Pt's nurse, Kendal Hymendie, has been notified, and agrees to send original paperwork along with pt via Juel [place]row[place], and to call report to [phone].  Ian Canninghomas Cassidie Veiga, MA Triage Specialist [phone]

## 2015-12-24 NOTE — ED Notes (Signed)
[facility] Expected date:  Expected time:  Means of arrival:  Comments: Hal D

## 2015-12-24 NOTE — ED Notes (Signed)
Pt oriented to room and unit.  Pt is pleasant and cooperative.  Pt denies S/I and H/I at this time.  15 minute checks and video monitoring in place.

## 2015-12-24 NOTE — Tx Team (Signed)
Initial Treatment Plan 12/24/2015 7:22 PM Ian PlowmanJonathan N Bowdish QIO:[id number]:[id number]    PATIENT STRESSORS: Financial difficulties Occupational concerns Substance abuse   PATIENT STRENGTHS: Average or above average intelligence Capable of independent living Communication skills General fund of knowledge Motivation for treatment/growth   PATIENT IDENTIFIED PROBLEMS: Depression  Anxiety  Substance abuse  "Get detoxed and back on track"  "Figure out how to stay clean and work at the same time"             DISCHARGE CRITERIA:  Improved stabilization in mood, thinking, and/or behavior Verbal commitment to aftercare and medication compliance Withdrawal symptoms are absent or subacute and managed without 24-hour nursing intervention  PRELIMINARY DISCHARGE PLAN: Attend aftercare/continuing care group Outpatient therapy Medication management  PATIENT/FAMILY INVOLVEMENT: This treatment plan has been presented to and reviewed with the patient, Ian PlowmanJonathan N Olesky.  The patient and family have been given the opportunity to ask questions and make suggestions.  Levin BaconHeather V Latisia Hilaire, RN 12/24/2015, 7:22 PM

## 2015-12-25 DIAGNOSIS — F10129 Alcohol abuse with intoxication, unspecified: Secondary | ICD-10-CM | POA: Clinically undetermined

## 2015-12-25 DIAGNOSIS — F10180 Alcohol abuse with alcohol-induced anxiety disorder: Secondary | ICD-10-CM

## 2015-12-25 LAB — RAPID URINE DRUG SCREEN, HOSP PERFORMED
Amphetamines: NOT DETECTED
BARBITURATES: NOT DETECTED
BENZODIAZEPINES: POSITIVE — AB
Cocaine: NOT DETECTED
Opiates: NOT DETECTED
TETRAHYDROCANNABINOL: NOT DETECTED

## 2015-12-25 MED ORDER — HYDROXYZINE PAMOATE 50 MG PO CAPS
50.0000 mg | ORAL_CAPSULE | Freq: Four times a day (QID) | ORAL | Status: DC | PRN
  Administered 2015-12-26 – 2015-12-27 (×4): 50 mg via ORAL
  Filled 2015-12-25 (×5): qty 1

## 2015-12-25 MED ORDER — PROPRANOLOL HCL 20 MG PO TABS
20.0000 mg | ORAL_TABLET | Freq: Every day | ORAL | Status: DC
  Administered 2015-12-25 – 2015-12-27 (×3): 20 mg via ORAL
  Filled 2015-12-25 (×5): qty 1

## 2015-12-25 MED ORDER — NALTREXONE HCL 50 MG PO TABS
50.0000 mg | ORAL_TABLET | Freq: Every day | ORAL | Status: DC
  Administered 2015-12-25 – 2015-12-28 (×4): 50 mg via ORAL
  Filled 2015-12-25 (×6): qty 1

## 2015-12-25 MED ORDER — MOMETASONE FURO-FORMOTEROL FUM 100-5 MCG/ACT IN AERO
2.0000 | INHALATION_SPRAY | Freq: Two times a day (BID) | RESPIRATORY_TRACT | Status: DC
  Administered 2015-12-25 – 2015-12-28 (×7): 2 via RESPIRATORY_TRACT
  Filled 2015-12-25 (×2): qty 8.8

## 2015-12-25 MED ORDER — ACAMPROSATE CALCIUM 333 MG PO TBEC
666.0000 mg | DELAYED_RELEASE_TABLET | Freq: Three times a day (TID) | ORAL | Status: DC
  Administered 2015-12-25 – 2015-12-28 (×10): 666 mg via ORAL
  Filled 2015-12-25 (×17): qty 2

## 2015-12-25 MED ORDER — QUETIAPINE FUMARATE 25 MG PO TABS
25.0000 mg | ORAL_TABLET | Freq: Every day | ORAL | Status: DC
  Administered 2015-12-25 – 2015-12-26 (×2): 50 mg via ORAL
  Administered 2015-12-27: 25 mg via ORAL
  Filled 2015-12-25 (×9): qty 1

## 2015-12-25 MED ORDER — IBUPROFEN 800 MG PO TABS
800.0000 mg | ORAL_TABLET | Freq: Four times a day (QID) | ORAL | Status: DC | PRN
  Administered 2015-12-25 – 2015-12-26 (×2): 800 mg via ORAL
  Filled 2015-12-25 (×3): qty 1

## 2015-12-25 NOTE — [facility] Suicide Risk Assessment (Signed)
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 Cellulitis and abscess [L03.90, L02.91] 02/15/2015  . Peritoneal cyst [K66.8] 02/15/2015  . 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 Alcoholic pancreatitis [K85.20] 02/08/2015  . Pancreatitis, alcoholic, acute [K85.20] 02/08/2015  . H/O acute alcoholic hepatitis [Z87.19] 02/08/2015  . 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 Alcohol dependence, daily use (HCC) [F10.20] 02/03/2015  . Alcohol intoxication in alcoholism with blood level over 0.3 with complication (HCC) [F10.229] 02/03/2015  . Hx of pancreatitis [Z87.19] 01/19/2015  . Prolonged Q-T interval on ECG [R94.31] 01/19/2015  . History of gastrointestinal disease [Z87.19] 01/19/2015  . Alcohol use disorder, severe, dependence (HCC) [F10.20] 08/23/2014  . Generalized anxiety disorder [F41.1] 08/14/2013  . Moderate episode of recurrent major depressive disorder (HCC) [F33.1] 08/14/2013  . ASTHMA [J45.909] 07/31/2008   Subjective Data: Pt reports that he stopped his campral and relapsed on alcohol, wants to get help.  Continued Clinical Symptoms:  Alcohol Use Disorder Identification Test Final Score (AUDIT): 27 The "Alcohol Use Disorders Identification Test", Guidelines for Use in Primary Care, Second Edition.  World Science writerHealth Organization  [facility](WHO). Score between 0-7:  no or low risk or alcohol related problems. Score between 8-15:  moderate risk of alcohol related problems. Score between 16-19:  high risk of alcohol related problems. Score 20 or above:  warrants further diagnostic evaluation for alcohol dependence and treatment.   CLINICAL FACTORS:   Severe Anxiety and/or Agitation Alcohol/Substance Abuse/Dependencies Previous Psychiatric Diagnoses and Treatments   Musculoskeletal: Strength & Muscle Tone: within normal limits Gait & Station: normal Patient leans: N/A  Psychiatric Specialty Exam: Physical Exam  ROS  Blood pressure 109/74, pulse 92, temperature 97.9 F (36.6 C), resp. rate 18, height 5\' 11"  (1.803 m), weight 89.8 kg (198 lb), SpO2 98 %.Body mass index is 27.62 kg/m.  General Appearance: Casual  Eye Contact:  Fair  Speech:  Normal Rate  Volume:  Normal  Mood:  Anxious and Depressed  Affect:  Congruent  Thought Process:  Goal Directed and Descriptions of Associations: Circumstantial  Orientation:  Full (Time, Place, and Person)  Thought Content:  Logical  Suicidal Thoughts:  No  Homicidal Thoughts:  No  Memory:  Immediate;   Fair Recent;   Fair Remote;   Fair  Judgement:  Fair  Insight:  Fair  Psychomotor Activity:  Normal  Concentration:  Concentration: Fair and Attention Span: Fair  Recall:  FiservFair  Fund of Knowledge:  Fair  Language:  Fair  Akathisia:  No  Handed:  Right  AIMS (if indicated):     Assets:  Desire for Improvement  ADL's:  Intact  Cognition:  WNL  Sleep:         COGNITIVE FEATURES THAT CONTRIBUTE TO RISK:  Closed-mindedness, Polarized thinking and Thought constriction (tunnel vision)    SUICIDE RISK:   Mild:  Suicidal ideation of limited frequency, intensity, duration, and specificity.  There are no identifiable plans, no associated intent, mild  dysphoria and related symptoms, good self-control (both objective and subjective assessment), few other risk factors, and  identifiable protective factors, including available and accessible social support.   PLAN OF CARE: Patient with alcoholism and anxiety sx, sleep issues , presented after he relapsed on alcohol. EKG with hx of prolonged QTC. Will repeat EKG . Will restart his medications - will readjust medications if EKG continues to show qt prolongation. This was discussed with patient.  I certify that inpatient services furnished can reasonably be expected to improve the patient's condition.  Gearold Wainer, MD 12/25/2015, 11:13 AM

## 2015-12-25 NOTE — Progress Notes (Addendum)
  DATA ACTION RESPONSE  Objective- Pt. is up and visible in the milieu, sitting quietly in the dayroom. Pt. presents with an anxious affect and mood. Subjective- Denies having any SI/HI/AVH/Pain at this time. Pt. states "can I get something for sleep tonight?". Pt. continues to be cooperative and pleasant on the unit.   1:1 interaction in private to establish rapport. Encouragement, education, & support given from staff. Meds. ordered and administered. Last CIWA at a 5. EKG in a.m.    Safety maintained with Q 15 checks. Continues to follow treatment plan and will monitor closely. No additonal questions/concerns noted.

## 2015-12-25 NOTE — Progress Notes (Signed)
Patient ID: Ian PlowmanJonathan N [name], male   DOB: 01/24/1980, 35 y.o.   MRN: [id number]    EKG completed, Dr. Elna BreslowEappen was given copy to review. Jacquelyne BalintShalita Zakk Borgen RN

## 2015-12-25 NOTE — [facility] Group Notes (Signed)
[facility] Group Notes: (Clinical Social Work)   12/25/2015      Type of Therapy:  Group Therapy   Participation Level:  Did Not Attend despite MHT prompting - he did come in for the last 10-12 minutes of group, listened.   Ambrose MantleMareida Grossman-Orr, LCSW 12/25/2015, 1:09 PM

## 2015-12-25 NOTE — Progress Notes (Signed)
Patient ID: Ian PlowmanJonathan N [name], male   DOB: 09/16/1980, 35 y.o.   MRN: [id number]   D: Pt has been very flat and depressed on the unit today. Pt started the morning off reporting that his medication regimen was wrong and that he needed his home medications ordered. May NP was made aware and new orders were noted for his home medications. Pt reported that he was having lots of anxiety but felt better now that his medication was ordered. Pt reported that his depression was a 5, his hopelessness was a 5, and his anxiety was a  9. Pt reported that his goal for today was to get back on his medications. EKG also completed per doctors orders. Pt reported being negative SI/HI, no AH/VH noted. A: 15 min checks continued for patient safety. R: Pt safety maintained.

## 2015-12-25 NOTE — Progress Notes (Signed)
D: Pt denies SI/HI/AVH. Pt is pleasant and cooperative. Pt goal for today is to get back on his medications. A: Pt was offered support and encouragement. Pt was given scheduled medications. Pt was encourage to attend groups. Q 15 minute checks were done for safety.  R:Pt did not attend group and interacts well with peers and staff. Pt is taking medication. Pt has no complaints.Pt receptive to treatment and safety maintained on unit.

## 2015-12-25 NOTE — Progress Notes (Signed)
Adult Psychoeducational Group Note  Date:  12/25/2015 Time:  5:01 PM  Group Topic/Focus:  Coping With Mental Health Crisis:   The purpose of this group is to help patients identify strategies for coping with mental health crisis.  Group discusses possible causes of crisis and ways to manage them effectively.   Participation Level:  Active  Participation Quality:  Appropriate, Attentive, Sharing and Supportive  Affect:  Appropriate  Cognitive:  Alert and Appropriate  Insight: Good  Engagement in Group:  Engaged and Supportive  Modes of Intervention:  Discussion, Education and Support  Additional Comments:  Pt listened actively during group and provided appropriate contributions.  Reinaldo RaddleBrooks, Angell Pincock K 12/25/2015, 5:01 PM

## 2015-12-25 NOTE — Progress Notes (Signed)
Pt did not attend AA group meeting. 

## 2015-12-25 NOTE — H&P (Signed)
Psychiatric Admission Assessment Adult  Patient Identification: Ian [name] Matera MRN:  [id number] Date of Evaluation:  12/25/2015 Chief Complaint:  MDD RECURRENT SEVERE ETOH USE DISORDER,SEVERE Principal Diagnosis: Alcohol-induced mood disorder (HCC) Diagnosis:   Patient Active Problem List   Diagnosis Date Noted  . Alcohol-induced mood disorder (HCC) [F10.94] 12/24/2015    Priority: High  . Cellulitis and abscess [L03.90, L02.91] 02/15/2015  . Peritoneal cyst [K66.8] 02/15/2015  . Substance induced mood disorder (HCC) [F19.94] 02/08/2015  . Alcoholic pancreatitis [K85.20] 02/08/2015  . Pancreatitis, alcoholic, acute [K85.20] 02/08/2015  . H/O acute alcoholic hepatitis [Z87.19] 02/08/2015  . 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 Alcohol dependence, daily use (HCC) [F10.20] 02/03/2015  . Alcohol intoxication in alcoholism with blood level over 0.3 with complication (HCC) [F10.229] 02/03/2015  . Hx of pancreatitis [Z87.19] 01/19/2015  . Prolonged Q-T interval on ECG [R94.31] 01/19/2015  . Alcohol use disorder, severe, dependence (HCC) [F10.20] 08/23/2014  . GAD (generalized anxiety disorder) [F41.1] 08/14/2013  . Major depressive disorder, recurrent episode, moderate (HCC) [F33.1] 08/14/2013  . ASTHMA [J45.909] 07/31/2008   History of Present Illness: Ian [name], 35 yo male, with history of substance abuse with depression and anxiety.  Patient had been sober for a year, however, patient had mismanagement with Rx management and refills which led to relapse and decompensation.    Patient was aware of his triggers and decided to seek help before condition worsened.    Associated Signs/Symptoms: Depression Symptoms:  depressed mood, (Hypo) Manic Symptoms:  Impulsivity, Labiality of Mood, Anxiety Symptoms:  Excessive Worry, Panic Symptoms, Social Anxiety, Psychotic Symptoms:  NA PTSD Symptoms: NA Total Time spent with patient:  30 minutes  Past Psychiatric History: see HPI  Is the patient at risk to self? No.  Has the patient been a risk to self in the past 6 months? No.  Has the patient been a risk to self within the distant past? No.  Is the patient a risk to others? No.  Has the patient been a risk to others in the past 6 months? No.  Has the patient been a risk to others within the distant past? No.   Prior Inpatient Therapy:   Prior Outpatient Therapy:    Alcohol Screening: 1. How often do you have a drink containing alcohol?: 4 or more times a week 2. How many drinks containing alcohol do you have on a typical day when you are drinking?: 5 or 6 3. How often do you have six or more drinks on one occasion?: Weekly Preliminary Score: 5 4. How often during the last year have you found that you were not able to stop drinking once you had started?: Weekly 5. How often during the last year have you failed to do what was normally expected from you becasue of drinking?: Daily or almost daily 6. How often during the last year have you needed a first drink in the morning to get yourself going after a heavy drinking session?: Weekly 7. How often during the last year have you had a feeling of guilt of remorse after drinking?: Daily or almost daily 8. How often during the last year have you been unable to remember what happened the night before because you had been drinking?: Never 9. Have you or someone else been injured as a result of your drinking?: No 10. Has a relative or friend or a doctor or another health worker been concerned about your drinking or suggested you cut down?: Yes, during  the last year Alcohol Use Disorder Identification Test Final Score (AUDIT): 27 Brief Intervention: Yes Substance Abuse History in the last 12 months:  Yes.   Consequences of Substance Abuse: inpatient treatment Previous Psychotropic Medications: Yes  Psychological Evaluations: Yes  Past Medical History:  Past Medical History:   Diagnosis Date  . Alcohol dependence (HCC)   . Anxiety   . Asthma   . Depression   . Hypertension   . Pancreatitis     Past Surgical History:  Procedure Laterality Date  . NO PAST SURGERIES     Family History:  Family History  Problem Relation Age of Onset  . OCD Mother   . Heart disease Mother   . Suicidality Neg Hx    Family Psychiatric  History: see HPI Tobacco Screening: Have you used any form of tobacco in the last 30 days? (Cigarettes, Smokeless Tobacco, Cigars, and/or Pipes): Yes Tobacco use, Select all that apply: 4 or less cigarettes per day Are you interested in Tobacco Cessation Medications?: No, patient refused Counseled patient on smoking cessation including recognizing danger situations, developing coping skills and basic information about quitting provided: Refused/Declined practical counseling Social History:  History  Alcohol Use  . 36.0 oz/week  . 60 Shots of liquor per week    Comment: varies how much     History  Drug Use No    Additional Social History:      Pain Medications: See PTA medication list Prescriptions: See PtA medication list.  Pt says he has not been taking medication as he should. Over the Counter: None History of alcohol / drug use?: Yes Longest period of sobriety (when/how long): Pt was coming up on a year this month. Negative Consequences of Use: Personal relationships Withdrawal Symptoms: Agitation, Tremors, Nausea / Vomiting, Diarrhea, Patient aware of relationship between substance abuse and physical/medical complications, Sweats, Fever / Chills Name of Substance 1: ETOH 1 - Age of First Use: Teens 1 - Amount (size/oz): 1/4 of a gallon of vodka per day 1 - Frequency: Daily use 1 - Duration: Started back two weeks ago 1 - Last Use / Amount: 12/14 about a quarter of a gallon                  Allergies:  No Known Allergies Lab Results:  Results for orders placed or performed during the hospital encounter of 12/23/15  (from the past 48 hour(s))  Ethanol     Status: Abnormal   Collection Time: 12/23/15  7:03 PM  Result Value Ref Range   Alcohol, Ethyl (B) 380 (HH) <5 mg/dL    Comment:        LOWEST DETECTABLE LIMIT FOR SERUM ALCOHOL IS 5 mg/dL FOR MEDICAL PURPOSES ONLY CRITICAL RESULT CALLED TO, READ BACK BY AND VERIFIED WITH: ATKINS,L RN 2010 [phone] COVINGTON,[name] CORRECT DATE OF CALL 12/23/15     Blood Alcohol level:  Lab Results  Component Value Date   ETH 380 (HH) 12/23/2015   ETH <5 01/17/2015    Metabolic Disorder Labs:  Lab Results  Component Value Date   HGBA1C 5.9 (H) 12/24/2014   MPG 123 12/24/2014   MPG 111 08/14/2013   Lab Results  Component Value Date   PROLACTIN 14.2 01/20/2015   Lab Results  Component Value Date   CHOL 141 01/20/2015   TRIG 106 01/20/2015   HDL 40 (L) 01/20/2015   CHOLHDL 3.5 01/20/2015   VLDL 21 01/20/2015   LDLCALC 80 01/20/2015   LDLCALC 70 08/14/2013  Current Medications: Current Facility-Administered Medications  Medication Dose Route Frequency Provider Last Rate Last Dose  . acetaminophen (TYLENOL) tablet 650 mg  650 mg Oral Q6H PRN Beau Fanny, FNP      . alum & mag hydroxide-simeth (MAALOX/MYLANTA) 200-200-20 MG/5ML suspension 30 mL  30 mL Oral Q4H PRN Beau Fanny, FNP      . hydrOXYzine (VISTARIL) capsule 50 mg  50 mg Oral QID PRN Adonis Brook, NP      . LORazepam (ATIVAN) tablet 0-4 mg  0-4 mg Oral Q6H Beau Fanny, FNP   1 mg at 12/25/15 0617   Followed by  . [START ON 12/26/2015] LORazepam (ATIVAN) tablet 0-4 mg  0-4 mg Oral Q12H John C Withrow, FNP      . magnesium hydroxide (MILK OF MAGNESIA) suspension 30 mL  30 mL Oral Daily PRN Beau Fanny, FNP      . mometasone-formoterol (DULERA) 100-5 MCG/ACT inhaler 2 puff  2 puff Inhalation BID Adonis Brook, NP      . naltrexone (DEPADE) tablet 50 mg  50 mg Oral Daily Adonis Brook, NP      . pneumococcal 23 valent vaccine (PNU-IMMUNE) injection 0.5 mL  0.5 mL Intramuscular  Tomorrow-1000 Acquanetta Sit, MD      . propranolol (INDERAL) tablet 20 mg  20 mg Oral QHS Adonis Brook, NP      . QUEtiapine (SEROQUEL) tablet 25 mg  25 mg Oral QHS Adonis Brook, NP       PTA Medications: Prescriptions Prior to Admission  Medication Sig Dispense Refill Last Dose  . acamprosate (CAMPRAL) 333 MG tablet Take 2 tablets (666 mg total) by mouth 3 (three) times daily. 180 tablet 2 12/22/2015 at Unknown time  . albuterol (PROAIR HFA) 108 (90 Base) MCG/ACT inhaler Inhale 1-2 puffs into the lungs every 4 (four) hours as needed for wheezing.    Past Week at Unknown time  . hydrOXYzine (VISTARIL) 50 MG capsule Take 1 capsule (50 mg total) by mouth 4 (four) times daily as needed for anxiety. 120 capsule 2 12/22/2015 at Unknown time  . mometasone-formoterol (DULERA) 100-5 MCG/ACT AERO Inhale 2 puffs into the lungs 2 (two) times daily. 1 Inhaler 0 12/22/2015 at Unknown time  . mupirocin cream (BACTROBAN) 2 % Apply 1 application topically 3 (three) times daily. Apply to affected area on nose (Patient not taking: Reported on 10/11/2015) 15 g 0 Not Taking  . mupirocin ointment (BACTROBAN) 2 % Place 1 application into the nose 3 (three) times daily. (Patient not taking: Reported on 10/11/2015) 22 g 0 Not Taking  . naltrexone (DEPADE) 50 MG tablet Take 1 tablet (50 mg total) by mouth daily. 30 tablet 2 12/22/2015 at Unknown time  . propranolol (INDERAL) 20 MG tablet Take 1 tablet 1/2 hour before bedtime meds 30 tablet 2 12/22/2015 at 2100  . QUEtiapine (SEROQUEL) 25 MG tablet Take 1-2 tablets QHS 60 tablet 0 12/22/2015 at Unknown time  . sulfamethoxazole-trimethoprim (BACTRIM DS,SEPTRA DS) 800-160 MG tablet Take 1 tablet by mouth 2 (two) times daily. (Patient not taking: Reported on 10/11/2015) 30 tablet 0 Not Taking   Musculoskeletal: Strength & Muscle Tone: within normal limits Gait & Station: normal Patient leans: [name]/A  Psychiatric Specialty Exam: Physical Exam  Nursing note and  vitals reviewed.   ROS  Blood pressure 109/74, pulse 92, temperature 97.9 F (36.6 C), resp. rate 18, height  (1.803 m), weight 89.8 kg (198 lb), SpO2 98 %.Body mass index is 27.62  kg/m.  General Appearance: Neat  Eye Contact:  Fair  Speech:  Clear and Coherent  Volume:  Normal  Mood:  Anxious  Affect:  Congruent  Thought Process:  Coherent  Orientation:  Full (Time, Place, and Person)  Thought Content:  Rumination  Suicidal Thoughts:  No  Homicidal Thoughts:  No  Memory:  Immediate;   Good Recent;   Good Remote;   Good  Judgement:  Intact  Insight:  Fair  Psychomotor Activity:  Normal  Concentration:  Concentration: Good and Attention Span: Good  Recall:  Good  Fund of Knowledge:  Good  Language:  Good  Akathisia:  Negative  Handed:  Right  AIMS (if indicated):     Assets:  Communication Skills Desire for Improvement Resilience  ADL's:  Intact  Cognition:  WNL  Sleep:   poor, but back on Seroquel   Treatment Plan Summary: Admit for crisis management and mood stabilization. Medication management to re-stabilize current mood symptoms. Group counseling sessions for coping skills Medical consults as needed Review and reinstate any pertinent home medications for other health problems  Observation Level/Precautions:  15 minute checks  Laboratory:  per ED, added CBC and BMP  Psychotherapy:  group  Medications:  As per medlist  Consultations:  As needed  Discharge Concerns:  safety  Estimated LOS:  2-7 days  Other:     Physician Treatment Plan for Primary Diagnosis: Alcohol-induced mood disorder (HCC) Long Term Goal(s): Improvement in symptoms so as ready for discharge  Short Term Goals: Ability to identify changes in lifestyle to reduce recurrence of condition will improve, Ability to verbalize feelings will improve, Ability to disclose and discuss suicidal ideas, Ability to demonstrate self-control will improve, Ability to identify and develop effective coping  behaviors will improve, Ability to maintain clinical measurements within normal limits will improve, Compliance with prescribed medications will improve and Ability to identify triggers associated with substance abuse/mental health issues will improve  Physician Treatment Plan for Secondary Diagnosis: Principal Problem:   Alcohol-induced mood disorder (HCC)  Long Term Goal(s): Improvement in symptoms so as ready for discharge  Short Term Goals: Ability to identify changes in lifestyle to reduce recurrence of condition will improve, Ability to verbalize feelings will improve, Ability to disclose and discuss suicidal ideas, Ability to demonstrate self-control will improve, Ability to identify and develop effective coping behaviors will improve, Ability to maintain clinical measurements within normal limits will improve, Compliance with prescribed medications will improve and Ability to identify triggers associated with substance abuse/mental health issues will improve  I certify that inpatient services furnished can reasonably be expected to improve the patient's condition.    Lindwood Qua, NP [facility] 12/16/201710:21 AM

## 2015-12-25 NOTE — Plan of Care (Signed)
Problem: Safety: Goal: Periods of time without injury will increase Outcome: Progressing Pt. remains a low fall risk, denies SI/HI/AVH at this time, Q 15 checks in place for safety.    

## 2015-12-26 DIAGNOSIS — Z8249 Family history of ischemic heart disease and other diseases of the circulatory system: Secondary | ICD-10-CM

## 2015-12-26 DIAGNOSIS — Z8489 Family history of other specified conditions: Secondary | ICD-10-CM

## 2015-12-26 DIAGNOSIS — F10180 Alcohol abuse with alcohol-induced anxiety disorder: Secondary | ICD-10-CM

## 2015-12-26 DIAGNOSIS — Z87891 Personal history of nicotine dependence: Secondary | ICD-10-CM

## 2015-12-26 DIAGNOSIS — Z79899 Other long term (current) drug therapy: Secondary | ICD-10-CM

## 2015-12-26 DIAGNOSIS — F10129 Alcohol abuse with intoxication, unspecified: Secondary | ICD-10-CM

## 2015-12-26 LAB — BASIC METABOLIC PANEL
ANION GAP: 9 (ref 5–15)
BUN: 11 mg/dL (ref 6–20)
CO2: 26 mmol/L (ref 22–32)
Calcium: 9 mg/dL (ref 8.9–10.3)
Chloride: 103 mmol/L (ref 101–111)
Creatinine, Ser: 0.75 mg/dL (ref 0.61–1.24)
GFR calc Af Amer: >60 mL/min (ref >60)
GFR calc non Af Amer: >60 mL/min (ref >60)
GLUCOSE: 100 mg/dL — AB (ref 65–99)
Potassium: 3.4 mmol/L — ABNORMAL LOW (ref 3.5–5.1)
Sodium: 138 mmol/L (ref 135–145)

## 2015-12-26 LAB — CBC
HEMATOCRIT: 43.3 % (ref 39.0–52.0)
Hemoglobin: 15 g/dL (ref 13.0–17.0)
MCH: 30.5 pg (ref 26.0–34.0)
MCHC: 34.6 g/dL (ref 30.0–36.0)
MCV: 88 fL (ref 78.0–100.0)
Platelets: 184 10*3/uL (ref 150–400)
RBC: 4.92 MIL/uL (ref 4.22–5.81)
RDW: 14 % (ref 11.5–15.5)
WBC: 9.3 10*3/uL (ref 4.0–10.5)

## 2015-12-26 LAB — LIPID PANEL
CHOLESTEROL: 176 mg/dL (ref 0–200)
HDL: 62 mg/dL (ref >40)
LDL Cholesterol: 98 mg/dL (ref 0–99)
TRIGLYCERIDES: 82 mg/dL (ref <150)
Total CHOL/HDL Ratio: 2.8 RATIO
VLDL: 16 mg/dL (ref 0–40)

## 2015-12-26 LAB — TSH: TSH: 2.133 u[IU]/mL (ref 0.350–4.500)

## 2015-12-26 MED ORDER — LORAZEPAM 1 MG PO TABS
1.0000 mg | ORAL_TABLET | Freq: Four times a day (QID) | ORAL | Status: DC | PRN
  Administered 2015-12-26 (×2): 1 mg via ORAL
  Filled 2015-12-26 (×2): qty 1

## 2015-12-26 NOTE — [facility] Group Notes (Signed)
Adult Therapy Group Note  Date: 12/26/2015  Time:  10:00-11:00AM  Group Topic/Focus: Healthy Support Systems   Building Self Esteem:   The focus of this group was to assist patients in identifying their current healthy supports and unhealthy supports, then discussing how to add additional healthy supports and reduce existing unhealthy ones.  The whiteboard list from yesterday was used to discuss supports that could help patients to use the healthy coping skills listed as opposed to the unhealthy ones.  Cards with names of possible healthy supports on them were divided up and shared by group members, The healthy additions included supports such as 12-step groups, individual therapy, psychiatrists, faith activities,  sponsors, group therapy, support groups, classes on mental health, and more.    Participation Level:  Active  Participation Quality:  Attentive and Sharing  Affect:  Appropriate  Cognitive:  Appropriate  Insight: Good  Engagement in Group:  Engaged  Modes of Intervention:  Discussion and Support  Additional Comments:  The patient expressed himself well and appropriately during group, was an interactive group member who appeared to be motivated to get better.  Lynnell ChadMareida J Grossman-Orr, LCSW 12/26/2015  12:38 PM

## 2015-12-26 NOTE — Progress Notes (Signed)
[facility] MD Progress Note  12/26/2015 12:23 PM Ian [name]  MRN:  [id number]   Subjective:  "I'm worried about my cravings and my withdrawal." Objective:  Patient was irritable this morning.  He states that the night nurse did not allow him to use his phone to contact work via messaging system.  Otherwise, he still is having withdrawal anxiety and shakes.  He is also worried about his cravings.  Received phone call from parents that also upset patient and triggered a low mood.  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 Cellulitis and abscess [L03.90, L02.91] 02/15/2015  . Peritoneal cyst [K66.8] 02/15/2015  . Drug-induced mood disorder ([place]) [F19.94] 02/08/2015  . Alcoholic pancreatitis [K53.97] 02/08/2015  . Pancreatitis, alcoholic, acute [Q73.41] 93/79/0240  . H/O acute alcoholic hepatitis [X73.53] 02/08/2015  . Alcohol dependence with alcohol-induced sleep disorder ([place]) [F10.282] 02/03/2015  . Alcohol dependence, daily use ([place]) [F10.20] 02/03/2015  . Alcohol intoxication in alcoholism with blood level over 0.3 with complication ([place]) [G99.242] 02/03/2015  . Hx of pancreatitis [Z87.19] 01/19/2015  . Prolonged Q-T interval on ECG [R94.31] 01/19/2015  . History of gastrointestinal disease [Z87.19] 01/19/2015  . Alcohol use disorder, severe, dependence ([place]) [F10.20] 08/23/2014  . Generalized anxiety disorder [F41.1] 08/14/2013  . Moderate episode of recurrent major depressive disorder ([place]) [F33.1] 08/14/2013  . ASTHMA [J45.909] 07/31/2008   Total Time spent with patient: 30 minutes  Past Psychiatric History: see HPI  Past Medical History:  Past Medical History:  Diagnosis Date  . Alcohol dependence ([place])   . Anxiety    . Asthma   . Depression   . Hypertension   . Pancreatitis     Past Surgical History:  Procedure Laterality Date  . NO PAST SURGERIES     Family History:  Family History  Problem Relation Age of Onset  . OCD Mother   . Heart disease Mother   . Suicidality Neg Hx    Family Psychiatric  History: see HPI Social History:  History  Alcohol Use  . 36.0 oz/week  . 49 Shots of liquor per week    Comment: varies how much     History  Drug Use No    Social History   Social History  . Marital status: Single    Spouse name: N/A  . Number of children: N/A  . Years of education: N/A   Social History Main Topics  . Smoking status: Former Smoker    Types: Cigarettes    Quit date: 01/31/1999  . Smokeless tobacco: Never Used  . Alcohol use 36.0 oz/week    60 Shots of liquor per week     Comment: varies how much  . Drug use: No  . Sexual activity: Yes    Birth control/ protection: Condom   Other Topics Concern  . None   Social History Narrative  . None   Additional Social History:    Pain Medications: See PTA medication list Prescriptions: See PtA medication list.  Pt says he has not been taking medication as he should. Over the Counter: None History of alcohol / drug use?: Yes Longest period of sobriety (when/how long): Pt was coming up on a year this month. Negative Consequences of Use: Personal relationships Withdrawal Symptoms: Agitation, Tremors, Nausea / Vomiting, Diarrhea,  Patient aware of relationship between substance abuse and physical/medical complications, Sweats, Fever / Chills Name of Substance 1: ETOH 1 - Age of First Use: Teens 1 - Amount (size/oz): 1/4 of a gallon of vodka per day 1 - Frequency: Daily use 1 - Duration: Started back two weeks ago 1 - Last Use / Amount: 12/14 about a quarter of a gallon    Sleep: Good  Appetite:  Good  Current Medications: Current Facility-Administered Medications  Medication Dose Route Frequency Provider Last  Rate Last Dose  . acamprosate (CAMPRAL) tablet 666 mg  666 mg Oral TID WC Kerrie Buffalo, NP   666 mg at 12/26/15 1151  . acetaminophen (TYLENOL) tablet 650 mg  650 mg Oral Q6H PRN Benjamine Mola, FNP      . alum & mag hydroxide-simeth (MAALOX/MYLANTA) 200-200-20 MG/5ML suspension 30 mL  30 mL Oral Q4H PRN Benjamine Mola, FNP      . hydrOXYzine (VISTARIL) capsule 50 mg  50 mg Oral QID PRN Kerrie Buffalo, NP   50 mg at 12/26/15 1057  . ibuprofen (ADVIL,MOTRIN) tablet 800 mg  800 mg Oral Q6H PRN Derrill [place], NP   800 mg at 12/26/15 0754  . LORazepam (ATIVAN) tablet 1 mg  1 mg Oral Q6H PRN Kerrie Buffalo, NP      . magnesium hydroxide (MILK OF MAGNESIA) suspension 30 mL  30 mL Oral Daily PRN Benjamine Mola, FNP      . mometasone-formoterol [facility]) 100-5 MCG/ACT inhaler 2 puff  2 puff Inhalation BID Kerrie Buffalo, NP   2 puff at 12/26/15 0750  . naltrexone (DEPADE) tablet 50 mg  50 mg Oral Daily Kerrie Buffalo, NP   50 mg at 12/26/15 0750  . pneumococcal 23 valent vaccine (PNU-IMMUNE) injection 0.5 mL  0.5 mL Intramuscular Tomorrow-1000 Linard Millers, MD      . propranolol (INDERAL) tablet 20 mg  20 mg Oral QHS Kerrie Buffalo, NP   20 mg at 12/25/15 2205  . QUEtiapine (SEROQUEL) tablet 25 mg  25 mg Oral QHS Kerrie Buffalo, NP   50 mg at 12/25/15 2244    Lab Results:  Results for orders placed or performed during the hospital encounter of 12/24/15 (from the past 48 hour(s))  Rapid urine drug screen (hospital performed)     Status: Abnormal   Collection Time: 12/25/15  6:40 PM  Result Value Ref Range   Opiates NONE DETECTED NONE DETECTED   Cocaine NONE DETECTED NONE DETECTED   Benzodiazepines POSITIVE (A) NONE DETECTED   Amphetamines NONE DETECTED NONE DETECTED   Tetrahydrocannabinol NONE DETECTED NONE DETECTED   Barbiturates NONE DETECTED NONE DETECTED    Comment:        DRUG SCREEN FOR MEDICAL PURPOSES ONLY.  IF CONFIRMATION IS NEEDED FOR ANY PURPOSE, NOTIFY LAB WITHIN 5  DAYS.        LOWEST DETECTABLE LIMITS FOR URINE DRUG SCREEN Drug Class       Cutoff (ng/mL) Amphetamine      1000 Barbiturate      200 Benzodiazepine   326 Tricyclics       712 Opiates          300 Cocaine          300 THC              50 Performed at [facility]   TSH     Status: None   Collection Time: 12/26/15  6:29 AM  Result Value Ref  Range   TSH 2.133 0.350 - 4.500 uIU/mL    Comment: Performed by a 3rd Generation assay with a functional sensitivity of <=0.01 uIU/mL. Performed at [facility]   Lipid panel     Status: None   Collection Time: 12/26/15  6:29 AM  Result Value Ref Range   Cholesterol 176 0 - 200 mg/dL   Triglycerides 82 <150 mg/dL   HDL 62 >40 mg/dL   Total CHOL/HDL Ratio 2.8 RATIO   VLDL 16 0 - 40 mg/dL   LDL Cholesterol 98 0 - 99 mg/dL    Comment:        Total Cholesterol/HDL:CHD Risk Coronary Heart Disease Risk Table                     Men   Women  1/2 Average Risk   3.4   3.3  Average Risk       5.0   4.4  2 X Average Risk   9.6   7.1  3 X Average Risk  23.4   11.0        Use the calculated Patient Ratio above and the CHD Risk Table to determine the patient's CHD Risk.        ATP III CLASSIFICATION (LDL):  <100     mg/dL   Optimal  100-129  mg/dL   Near or Above                    Optimal  130-159  mg/dL   Borderline  160-189  mg/dL   High  >190     mg/dL   Very High Performed at [facility]   CBC     Status: None   Collection Time: 12/26/15  6:29 AM  Result Value Ref Range   WBC 9.3 4.0 - 10.5 K/uL   RBC 4.92 4.22 - 5.81 MIL/uL   Hemoglobin 15.0 13.0 - 17.0 g/dL   HCT 43.3 39.0 - 52.0 %   MCV 88.0 78.0 - 100.0 fL   MCH 30.5 26.0 - 34.0 pg   MCHC 34.6 30.0 - 36.0 g/dL   RDW 14.0 11.5 - 15.5 %   Platelets 184 150 - 400 K/uL    Comment: Performed at [place] metabolic panel     Status: Abnormal   Collection Time: 12/26/15  6:29 AM  Result Value Ref Range    Sodium 138 135 - 145 mmol/L   Potassium 3.4 (L) 3.5 - 5.1 mmol/L   Chloride 103 101 - 111 mmol/L   CO2 26 22 - 32 mmol/L   Glucose, Bld 100 (H) 65 - 99 mg/dL   BUN 11 6 - 20 mg/dL   Creatinine, Ser 0.75 0.61 - 1.24 mg/dL   Calcium 9.0 8.9 - 10.3 mg/dL   GFR calc non Af Amer >60 >60 mL/min   GFR calc Af Amer >60 >60 mL/min    Comment: (NOTE) The eGFR has been calculated using the CKD EPI equation. This calculation has not been validated in all clinical situations. eGFR's persistently <60 mL/min signify possible Chronic Kidney Disease.    Anion gap 9 5 - 15    Comment: Performed at [facility]    Blood Alcohol level:  Lab Results  Component Value Date   ETH [address] [facility]) 12/23/2015   ETH <5 63/81/7711    Metabolic Disorder Labs: Lab Results  Component Value Date   HGBA1C 5.9 (H) 12/24/2014  MPG 123 12/24/2014   MPG 111 08/14/2013   Lab Results  Component Value Date   PROLACTIN 14.2 01/20/2015   Lab Results  Component Value Date   CHOL 176 12/26/2015   TRIG 82 12/26/2015   HDL 62 12/26/2015   CHOLHDL 2.8 12/26/2015   VLDL 16 12/26/2015   LDLCALC 98 12/26/2015   LDLCALC 80 01/20/2015    Physical Findings: AIMS: Facial and Oral Movements Muscles of Facial Expression: None, normal Lips and Perioral Area: None, normal Jaw: None, normal Tongue: None, normal,Extremity Movements Upper (arms, wrists, hands, fingers): None, normal Lower (legs, knees, ankles, toes): None, normal, Trunk Movements Neck, shoulders, hips: None, normal, Overall Severity Severity of abnormal movements (highest score from questions above): None, normal Incapacitation due to abnormal movements: None, normal Patient's awareness of abnormal movements (rate only patient's report): No Awareness, Dental Status Current problems with teeth and/or dentures?: No Does patient usually wear dentures?: No  CIWA:  CIWA-Ar Total: 10 COWS:     Musculoskeletal: Strength & Muscle Tone:  within normal limits Gait & Station: normal Patient leans: N/A  Psychiatric Specialty Exam: Physical Exam  Nursing note and vitals reviewed. Psychiatric: He has a normal mood and affect. His behavior is normal. Judgment and thought content normal.    Review of Systems  Constitutional: Negative.   HENT: Negative.   Eyes: Negative.   Respiratory: Negative.   Cardiovascular: Negative.   Gastrointestinal: Negative.   Genitourinary: Negative.   Musculoskeletal: Negative.   Skin: Negative.   Neurological: Negative.   Endo/Heme/Allergies: Negative.   Psychiatric/Behavioral: Positive for depression. The patient is nervous/anxious.   All other systems reviewed and are negative.   Blood pressure 117/81, pulse (!) 107, temperature 97.9 F (36.6 C), resp. rate 18, height _0  (1.803 m), weight 89.8 kg (198 lb), SpO2 98 %.Body mass index is 27.62 kg/m.  General Appearance: Fairly Groomed  Eye Contact:  Fair  Speech:  Clear and Coherent  Volume:  Normal  Mood:  Anxious  Affect:  Congruent and Depressed  Thought Process:  Goal Directed  Orientation:  Full (Time, Place, and Person)  Thought Content:  Rumination  Suicidal Thoughts:  No  Homicidal Thoughts:  No  Memory:  Immediate;   Fair Recent;   Fair Remote;   Fair  Judgement:  Fair  Insight:  Fair  Psychomotor Activity:  Normal  Concentration:  Concentration: Fair and Attention Span: Fair  Recall:  [employer] of Knowledge:  Fair  Language:  Fair  Akathisia:  No  Handed:  Right  AIMS (if indicated):     Assets:  Communication Skills Desire for Improvement Physical Health Resilience  ADL's:  Intact  Cognition:  WNL  Sleep:  Number of Hours: 6   Treatment Plan Summary: Review of chart, vital signs, medications, and notes.  1-Individual and group therapy  2-Medication management for depression and anxiety: Medications reviewed with the patient.  Lorazepam protocol dc'd, switched to Q6 hrs PRN for for CIWA. 3-Coping  skills for depression, anxiety  4-Continue crisis stabilization and management  5-Address health issues--monitoring vital signs, stable  6-Treatment plan in progress to prevent relapse of depression and anxiety  Janett Labella, NP [facility] 12/26/2015, 12:23 PM

## 2015-12-26 NOTE — Progress Notes (Signed)
D:  Per pt self inventory pt reports sleeping poor, appetite fair, energy level normal, ability to pay attention good, rates depression at a 6 out of 10, hopelessness at a 4 out of 10, anxiety at a 9 out of 10, denies SI/HI/AVH, goal today: "To be able to get in touch with work via app, aftercare planning for parents make sure meds are right."     A:  Emotional support provided, Encouraged pt to continue with treatment plan and attend all group activities, q15 min checks maintained for safety.  R:  Pt is participating/receptive, going to some groups, otherwise pleasant and cooperative with staff and other patients on the unit.

## 2015-12-26 NOTE — Progress Notes (Signed)
Patient did attend the evening speaker AA meeting.  

## 2015-12-26 NOTE — Progress Notes (Signed)
D.  Pt pleasant on approach, denies complaints at this time.  Positive for evening AA group, interacting appropriately with peers on the unit.  Pt denies SI/HI/halllucinations at this time.  A.  Support and encouragement offered, medication given as ordered  R.  Pt remains safe on the unit, will continue to monitor.

## 2015-12-26 NOTE — [facility] Counselor (Signed)
Adult Comprehensive Assessment  Patient ID: Ian [name], male   DOB: 08/04/1980, 35 y.o.   MRN: [id number]  Information Source: Information source: Patient  Current Stressors:  Educational / Learning stressors: Denies stressors Employment / Job issues: Denies stressors Family Relationships: Mother and stepfather are very stressful for him, even when they are across the country.  She is very overbearing and demanding because they are very involved financially in his life.  They will not acknowledge how they berate him. Financial / Lack of resources (include bankruptcy): Improving over the last year. Housing / Lack of housing: Denies stressors Physical health (include injuries & life threatening diseases): Denies stressors Social relationships: When drinking, not as cheerful.  "I'd love to be dating, that's kind of a sore subject."  Substance abuse: Stressful because he relapsed. Bereavement / Loss: Denies stressors  Living/Environment/Situation:  Living Arrangements: Alone, Children, Parent (Lives in own apartment alone.  When has son, will stay at parents) Living conditions (as described by patient or guardian): Comfortable, safe How long has patient lived in current situation?: 13 months What is atmosphere in current home: Comfortable  Family History:  Marital status: Single Are you sexually active?: Yes What is your sexual orientation?: Straight Does patient have children?: Yes How many children?: 1 How is patient's relationship with their children?: Relationship with 14yo son has improved over the past year, is more involved now that he has been sober.  Childhood History:  By whom was/is the patient raised?: Both parents Additional childhood history information: Parents divorced at pt's age 35 Description of patient's relationship with caregiver when they were a child: "fine" Patient's description of current relationship with people who raised him/her: Mother - stressed;  Stepfather - judgmental; Father - distant How were you disciplined when you got in trouble as a child/adolescent?: Very little discipline Does patient have siblings?: Yes Number of Siblings: 2 Description of patient's current relationship with siblings: 1 sister - good relationship; 1 half-sister - distant Did patient suffer any verbal/emotional/physical/sexual abuse as a child?: No Did patient suffer from severe childhood neglect?: No Has patient ever been sexually abused/assaulted/raped as an adolescent or adult?: No Was the patient ever a victim of a crime or a disaster?: No Witnessed domestic violence?: No Has patient been effected by domestic violence as an adult?: No  Education:  Highest grade of school patient has completed: Energy managerBachelor's in Investment banker, corporatepolitical science and business Currently a student?: No Learning disability?: No  Employment/Work Situation:   Employment situation: Employed 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  Had started drinking before and after work, had to do something before he got caught. What is the longest time patient has a held a job?: 1.5 years or more Where was the patient employed at that time?: [employer] Has patient ever been in the [employer] Lilly and Companymilitary?: No Are There Guns or Other Weapons in Your Home?: No  Financial Resources:   Financial resources: Income from employment, Support from parents / caregiver, Private insurance Does patient have a representative payee or guardian?: No  Alcohol/Substance Abuse:   What has been your use of drugs/alcohol within the last 12 months?: Alcohol since about 6 weeks ago.  Started 2-3 weeks ago drinking daily. Alcohol/Substance Abuse Treatment Hx: Past detox,  Past Tx,  Inpatient, Past Tx, Outpatient, Attends AA/NA If yes, describe treatment: Just finished CDIOP and is still in the aftercare program weekly.  Goes to AA, had a sponsor, does not have a current sponsor.  [facility], [facility]. Has alcohol/substance abuse ever caused legal problems?: Yes  Social Support System:   Patient's Community Support System: Fair Describe Community Support System: IOP program and AA for sobriety; parents for financial Type of faith/religion: Ephriam KnucklesChristian How does patient's faith help to cope with current illness?: "I haven't been using it like I should."  Leisure/Recreation:   Leisure and Hobbies: Go to gym, go to meetings.  Strengths/Needs:   What things does the patient do well?: Work, people In what areas does patient struggle / problems for patient: Holidays, not having a serious companion for so long, trying to get sobriety back on track  Discharge Plan:   Does patient have access to transportation?: Yes Will patient be returning to same living situation after discharge?: Yes Currently receiving community mental health services: Yes (From Whom) Does patient have financial barriers related to discharge medications?: No  Summary/Recommendations:   Summary and Recommendations (to be completed by the evaluator): Patient is a 35yo male admitted to the hospital with alcohol intoxication and auditory/visual hallucinations, [facility] Charmian [facility]uffAnn [facility]. Patient states that he was sober 1 year but has been drinking for past 6 weeks in variable amounts. In 24 hours prior to admission he drank half a gallon of vodka.  He reports primary trigger was increased depression, anxiety, relationship problems with mother and stepfather, stress over work.  Patient will benefit from crisis stabilization, medication evaluation, group therapy and psychoeducation, in addition to case management for discharge  planning. At discharge it is recommended that Patient adhere to the established discharge plan and continue in treatment  Ian [name]. 12/26/2015

## 2015-12-26 NOTE — Plan of Care (Signed)
Problem: Activity: Goal: Interest or engagement in activities will improve Outcome: Progressing Pt did attend evening AA group tonight   

## 2015-12-27 LAB — HEMOGLOBIN A1C
Hgb A1c MFr Bld: 5.4 % (ref 4.8–5.6)
MEAN PLASMA GLUCOSE: 108 mg/dL

## 2015-12-27 LAB — PROLACTIN: Prolactin: 16.2 ng/mL — ABNORMAL HIGH (ref 4.0–15.2)

## 2015-12-27 MED ORDER — HYDROXYZINE HCL 50 MG PO TABS
50.0000 mg | ORAL_TABLET | Freq: Once | ORAL | Status: AC
  Administered 2015-12-27: 50 mg via ORAL
  Filled 2015-12-27: qty 1

## 2015-12-27 MED ORDER — HYDROXYZINE HCL 50 MG PO TABS
50.0000 mg | ORAL_TABLET | Freq: Four times a day (QID) | ORAL | Status: DC | PRN
  Administered 2015-12-27 – 2015-12-28 (×2): 50 mg via ORAL
  Filled 2015-12-27 (×2): qty 1

## 2015-12-27 MED ORDER — GABAPENTIN 100 MG PO CAPS
200.0000 mg | ORAL_CAPSULE | Freq: Three times a day (TID) | ORAL | Status: DC
  Administered 2015-12-27 – 2015-12-28 (×4): 200 mg via ORAL
  Filled 2015-12-27 (×10): qty 2

## 2015-12-27 NOTE — Progress Notes (Signed)
[facility] MD Progress Note  12/27/2015 3:42 PM ACHILLES NEVILLE  MRN:  [id number]   Subjective: Clavin reports, "My anxiety is very high today"  Objective: Quint is seen, chart reviewed. He presents with complaints of high anxiety levels. He still is having withdrawal symptoms & appears distressed this am.  He is also worried about his cravings. He is started on Neurontin 200 mg tid for agitation/substance withdrawal syndrome. Hydroxyzine increased to 50 mg prn for anxiety. He is participating in the group milieu. Denies feeling or being depressed.  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 Cellulitis and abscess [L03.90, L02.91] 02/15/2015  . Peritoneal cyst [K66.8] 02/15/2015  . 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 Alcoholic pancreatitis [K85.20] 02/08/2015  . Pancreatitis, alcoholic, acute [K85.20] 02/08/2015  . H/O acute alcoholic hepatitis [Z87.19] 02/08/2015  . 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 Alcohol dependence, daily use (HCC) [F10.20] 02/03/2015  . Alcohol intoxication in alcoholism with blood level over 0.3 with complication (HCC) [F10.229] 02/03/2015  . Hx of pancreatitis [Z87.19] 01/19/2015  . Prolonged Q-T interval on ECG [R94.31] 01/19/2015  . History of gastrointestinal disease [Z87.19] 01/19/2015  . Alcohol use disorder, severe, dependence (HCC) [F10.20] 08/23/2014  . Generalized anxiety disorder [F41.1] 08/14/2013  . Moderate episode of recurrent major depressive disorder (HCC) [F33.1] 08/14/2013  . ASTHMA [J45.909] 07/31/2008   Total Time spent with patient: 15 minutes  Past Psychiatric History: Hx. Alcoholism, chronic  Past Medical  History:  Past Medical History:  Diagnosis Date  . Alcohol dependence (HCC)   . Anxiety   . Asthma   . Depression   . Hypertension   . Pancreatitis     Past Surgical History:  Procedure Laterality Date  . NO PAST SURGERIES     Family History:  Family History  Problem Relation Age of Onset  . OCD Mother   . Heart disease Mother   . Suicidality Neg Hx    Family Psychiatric  History: See H&P  Social History:  History  Alcohol Use  . 36.0 oz/week  . 60 Shots of liquor per week    Comment: varies how much     History  Drug Use No    Social History   Social History  . Marital status: Single    Spouse name: N/A  . Number of children: N/A  . Years of education: N/A   Social History Main Topics  . Smoking status: Former Smoker    Types: Cigarettes    Quit date: 01/31/1999  . Smokeless tobacco: Never Used  . Alcohol use 36.0 oz/week    60 Shots of liquor per week     Comment: varies how much  . Drug use: No  . Sexual activity: Yes    Birth control/ protection: Condom   Other Topics Concern  . None   Social History Narrative  . None   Additional Social History:    Pain Medications: See PTA medication list Prescriptions: See PtA medication list.  Pt says he has not been taking medication as he should. Over the Counter: None History of alcohol / drug use?: Yes Longest period of sobriety (when/how long): Pt was coming up on a year this month. Negative Consequences of Use: Personal relationships Withdrawal Symptoms:  Agitation, Tremors, Nausea / Vomiting, Diarrhea, Patient aware of relationship between substance abuse and physical/medical complications, Sweats, Fever / [place] Name of Substance 1: ETOH 1 - Age of First Use: Teens 1 - Amount (size/oz): 1/4 of a gallon of vodka per day 1 - Frequency: Daily use 1 - Duration: Started back two weeks ago 1 - Last Use / Amount: 12/14 about a quarter of a gallon    Sleep: Good  Appetite:  Good  Current  Medications: Current Facility-Administered Medications  Medication Dose Route Frequency Provider Last Rate Last Dose  . acamprosate (CAMPRAL) tablet 666 mg  666 mg Oral TID WC Kerrie Buffalo, NP   666 mg at 12/27/15 1152  . acetaminophen (TYLENOL) tablet 650 mg  650 mg Oral Q6H PRN Benjamine Mola, FNP      . alum & mag hydroxide-simeth (MAALOX/MYLANTA) 200-200-20 MG/5ML suspension 30 mL  30 mL Oral Q4H PRN Benjamine Mola, FNP      . gabapentin (NEURONTIN) capsule 200 mg  200 mg Oral TID Encarnacion Slates, NP   200 mg at 12/27/15 1154  . hydrOXYzine (ATARAX/VISTARIL) tablet 50 mg  50 mg Oral QID PRN Linard Millers, MD      . ibuprofen (ADVIL,MOTRIN) tablet 800 mg  800 mg Oral Q6H PRN Derrill Center, NP   800 mg at 12/26/15 0754  . LORazepam (ATIVAN) tablet 1 mg  1 mg Oral Q6H PRN Kerrie Buffalo, NP   1 mg at 12/26/15 2219  . magnesium hydroxide (MILK OF MAGNESIA) suspension 30 mL  30 mL Oral Daily PRN Benjamine Mola, FNP      . mometasone-formoterol [facility]) 100-5 MCG/ACT inhaler 2 puff  2 puff Inhalation BID Kerrie Buffalo, NP   2 puff at 12/27/15 0800  . naltrexone (DEPADE) tablet 50 mg  50 mg Oral Daily Kerrie Buffalo, NP   50 mg at 12/27/15 0801  . pneumococcal 23 valent vaccine (PNU-IMMUNE) injection 0.5 mL  0.5 mL Intramuscular Tomorrow-1000 Linard Millers, MD      . propranolol (INDERAL) tablet 20 mg  20 mg Oral QHS Kerrie Buffalo, NP   20 mg at 12/26/15 2219  . QUEtiapine (SEROQUEL) tablet 25 mg  25 mg Oral QHS Kerrie Buffalo, NP   50 mg at 12/26/15 2242   Lab Results:  Results for orders placed or performed during the hospital encounter of 12/24/15 (from the past 48 hour(s))  Rapid urine drug screen (hospital performed)     Status: Abnormal   Collection Time: 12/25/15  6:40 PM  Result Value Ref Range   Opiates NONE DETECTED NONE DETECTED   Cocaine NONE DETECTED NONE DETECTED   Benzodiazepines POSITIVE (A) NONE DETECTED   Amphetamines NONE DETECTED NONE DETECTED    Tetrahydrocannabinol NONE DETECTED NONE DETECTED   Barbiturates NONE DETECTED NONE DETECTED    Comment:        DRUG SCREEN FOR MEDICAL PURPOSES ONLY.  IF CONFIRMATION IS NEEDED FOR ANY PURPOSE, NOTIFY LAB WITHIN 5 DAYS.        LOWEST DETECTABLE LIMITS FOR URINE DRUG SCREEN Drug Class       Cutoff (ng/mL) Amphetamine      1000 Barbiturate      200 Benzodiazepine   741 Tricyclics       638 Opiates          300 Cocaine          300 THC  50 Performed at [facility]   TSH     Status: None   Collection Time: 12/26/15  6:29 AM  Result Value Ref Range   TSH 2.133 0.350 - 4.500 uIU/mL    Comment: Performed by a 3rd Generation assay with a functional sensitivity of <=0.01 uIU/mL. Performed at [facility]   Lipid panel     Status: None   Collection Time: 12/26/15  6:29 AM  Result Value Ref Range   Cholesterol 176 0 - 200 mg/dL   Triglycerides 82 <150 mg/dL   HDL 62 >40 mg/dL   Total CHOL/HDL Ratio 2.8 RATIO   VLDL 16 0 - 40 mg/dL   LDL Cholesterol 98 0 - 99 mg/dL    Comment:        Total Cholesterol/HDL:CHD Risk Coronary Heart Disease Risk Table                     Men   Women  1/2 Average Risk   3.4   3.3  Average Risk       5.0   4.4  2 X Average Risk   9.6   7.1  3 X Average Risk  23.4   11.0        Use the calculated Patient Ratio above and the CHD Risk Table to determine the patient's CHD Risk.        ATP III CLASSIFICATION (LDL):  <100     mg/dL   Optimal  100-129  mg/dL   Near or Above                    Optimal  130-159  mg/dL   Borderline  160-189  mg/dL   High  >190     mg/dL   Very High Performed at [facility]   Hemoglobin A1c     Status: None   Collection Time: 12/26/15  6:29 AM  Result Value Ref Range   Hgb A1c MFr Bld 5.4 4.8 - 5.6 %    Comment: (NOTE)         Pre-diabetes: 5.7 - 6.4         Diabetes: >6.4         Glycemic control for adults with diabetes: <7.0    Mean Plasma Glucose 108  mg/dL    Comment: (NOTE) Performed At: [facility] [place], [place] [id number] Lindon Romp MD WU:[id number] Performed at [facility]   Prolactin     Status: Abnormal   Collection Time: 12/26/15  6:29 AM  Result Value Ref Range   Prolactin 16.2 (H) 4.0 - 15.2 ng/mL    Comment: (NOTE) Performed At: [facility] [place], [place] [id number] Lindon Romp MD VH:[id number] Performed at [facility]   CBC     Status: None   Collection Time: 12/26/15  6:29 AM  Result Value Ref Range   WBC 9.3 4.0 - 10.5 K/uL   RBC 4.92 4.22 - 5.81 MIL/uL   Hemoglobin 15.0 13.0 - 17.0 g/dL   HCT 43.3 39.0 - 52.0 %   MCV 88.0 78.0 - 100.0 fL   MCH 30.5 26.0 - 34.0 pg   MCHC 34.6 30.0 - 36.0 g/dL   RDW 14.0 11.5 - 15.5 %   Platelets 184 150 - 400 K/uL    Comment: Performed at [place] metabolic panel  Status: Abnormal   Collection Time: 12/26/15  6:29 AM  Result Value Ref Range   Sodium 138 135 - 145 mmol/L   Potassium 3.4 (L) 3.5 - 5.1 mmol/L   Chloride 103 101 - 111 mmol/L   CO2 26 22 - 32 mmol/L   Glucose, Bld 100 (H) 65 - 99 mg/dL   BUN 11 6 - 20 mg/dL   Creatinine, Ser 0.75 0.61 - 1.24 mg/dL   Calcium 9.0 8.9 - 10.3 mg/dL   GFR calc non Af Amer >60 >60 mL/min   GFR calc Af Amer >60 >60 mL/min    Comment: (NOTE) The eGFR has been calculated using the CKD EPI equation. This calculation has not been validated in all clinical situations. eGFR's persistently <60 mL/min signify possible Chronic Kidney Disease.    Anion gap 9 5 - 15    Comment: Performed at [facility]   Blood Alcohol level:  Lab Results  Component Value Date   ETH 380 [facility]) 12/23/2015   ETH <5 52/84/1324   Metabolic Disorder Labs: Lab Results  Component Value Date   HGBA1C 5.4 12/26/2015   MPG 108 12/26/2015   MPG 123 12/24/2014   Lab Results  Component Value Date    PROLACTIN 16.2 (H) 12/26/2015   PROLACTIN 14.2 01/20/2015   Lab Results  Component Value Date   CHOL 176 12/26/2015   TRIG 82 12/26/2015   HDL 62 12/26/2015   CHOLHDL 2.8 12/26/2015   VLDL 16 12/26/2015   LDLCALC 98 12/26/2015   LDLCALC 80 01/20/2015   Physical Findings: AIMS: Facial and Oral Movements Muscles of Facial Expression: None, normal Lips and Perioral Area: None, normal Jaw: None, normal Tongue: None, normal,Extremity Movements Upper (arms, wrists, hands, fingers): None, normal Lower (legs, knees, ankles, toes): None, normal, Trunk Movements Neck, shoulders, hips: None, normal, Overall Severity Severity of abnormal movements (highest score from questions above): None, normal Incapacitation due to abnormal movements: None, normal Patient's awareness of abnormal movements (rate only patient's report): No Awareness, Dental Status Current problems with teeth and/or dentures?: No Does patient usually wear dentures?: No  CIWA:  CIWA-Ar Total: 7 COWS:     Musculoskeletal: Strength & Muscle Tone: within normal limits Gait & Station: normal Patient leans: N/A  Psychiatric Specialty Exam: Physical Exam  Nursing note and vitals reviewed. Psychiatric: He has a normal mood and affect. His behavior is normal. Judgment and thought content normal.    Review of Systems  Constitutional: Negative.   HENT: Negative.   Eyes: Negative.   Respiratory: Negative.   Cardiovascular: Negative.   Gastrointestinal: Negative.   Genitourinary: Negative.   Musculoskeletal: Negative.   Skin: Negative.   Neurological: Negative.   Endo/Heme/Allergies: Negative.   Psychiatric/Behavioral: Positive for depression. The patient is nervous/anxious.   All other systems reviewed and are negative.   Blood pressure 116/85, pulse 84, temperature 97.6 F (36.4 C), temperature source Oral, resp. rate 18, height '5\' 11"'$  (1.803 m), weight 89.8 kg (198 lb), SpO2 98 %.Body mass index is 27.62 kg/m.   General Appearance: Fairly Groomed  Eye Contact:  Fair  Speech:  Clear and Coherent  Volume:  Normal  Mood:  Anxious  Affect:  Congruent and Depressed  Thought Process:  Goal Directed  Orientation:  Full (Time, Place, and Person)  Thought Content:  Rumination  Suicidal Thoughts:  No  Homicidal Thoughts:  No  Memory:  Immediate;   Fair Recent;   Fair Remote;   Fair  Judgement:  Fair  Insight:  Fair  Psychomotor Activity:  Normal  Concentration:  Concentration: Fair and Attention Span: Fair  Recall:  [employer] of Knowledge:  Fair  Language:  Fair  Akathisia:  No  Handed:  Right  AIMS (if indicated):     Assets:  Communication Skills Desire for Improvement Physical Health Resilience  ADL's:  Intact  Cognition:  WNL  Sleep:  Number of Hours: 5.5   Treatment Plan Summary: Review of chart, vital signs, medications, and notes.  1-Individual and group therapy  2-Medication management for depression and anxiety: Medications reviewed with the patient. Lorazepam protocol dc'd, switched to Q6 hrs PRN for for CIWA. 3-Coping skills for depression, anxiety  4-Continue crisis stabilization and management  5-Address health issues--monitoring vital signs, stable  6-Treatment plan in progress to prevent relapse of depression and anxiety. Initiated Neurontin 200 mg tid for agitation/substance withdrawal syndrome. Will continue the Naltrexone 50 mg for alcoholism. Will continue Propranolol 20 mg for palpitations. Will continue Seroquel 25 mg for mood control. Increased Hydroxyzine to 50 mg prn for anxiety levels.  Encarnacion Slates, NP PMHNP-BC 12/27/2015, 3:42 PMPatient ID: Annabell Sabal, male   DOB: 06-Mar-1980, 35 y.o.   MRN: [id number] Agree with NP Progress Note as above  Neita Garnet, MD

## 2015-12-27 NOTE — [facility] Group Notes (Signed)
[facility] LCSW Group Therapy  12/27/2015 3:35 PM  Type of Therapy:  Group Therapy  Participation Level:  Active  Participation Quality:  Attentive  Affect:  Appropriate  Cognitive:  Alert and Oriented  Insight:  Improving  Engagement in Therapy:  Improving  Modes of Intervention:  Discussion, Education, Exploration, Problem-solving, Rapport Building, Socialization and Support  Summary of Progress/Problems: Today's Topic: Overcoming Obstacles. Patients identified one short term goal and potential obstacles in reaching this goal. Patients processed barriers involved in overcoming these obstacles. Patients identified steps necessary for overcoming these obstacles and explored motivation (internal and external) for facing these difficulties head on. [name] was attentive and engaged during today's processing group. He shared his most recent relapse "I was off my medication for a few days and the cravings came back." [name] shared that he hopes to get FMLA paperwork to his outpatient provider in order for them to complete in time and in order for him to return to work after discharge. "I've really done well over the past year and gotten my life back together. I had a brief relapse but I've learned that I have to be taking my medication."   Pulte HomesHeather N Smart LCSW 12/27/2015, 3:35 PM

## 2015-12-27 NOTE — Tx Team (Signed)
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 acamprosate (CAMPRAL) tablet 666 mg  666 mg Oral TID WC Adonis BrookSheila Agustin, NP   666 mg at 12/27/15 0606  . acetaminophen (TYLENOL) tablet 650 mg  650 mg Oral Q6H PRN Beau FannyJohn C Withrow, FNP      . alum & mag hydroxide-simeth (MAALOX/MYLANTA) 200-200-20 MG/5ML suspension 30 mL  30 mL Oral Q4H PRN Beau FannyJohn C Withrow, FNP      . hydrOXYzine (ATARAX/VISTARIL) tablet 50 mg  50 mg Oral QID PRN Acquanetta SitElizabeth Woods Oates, MD      . ibuprofen (ADVIL,MOTRIN) tablet 800 mg  800 mg Oral Q6H PRN Oneta Rackanika N Lewis, NP   800 mg at 12/26/15 0754  . LORazepam (ATIVAN) tablet 1 mg  1 mg Oral Q6H PRN Adonis BrookSheila Agustin, NP   1 mg at 12/26/15 2219  . magnesium hydroxide (MILK OF MAGNESIA) suspension 30 mL  30 mL Oral Daily PRN Beau FannyJohn C Withrow, FNP      . mometasone-formoterol [facility](DULERA) 100-5 MCG/ACT inhaler 2 puff  2 puff Inhalation BID Adonis BrookSheila Agustin, NP   2 puff at 12/27/15 0800  . naltrexone (DEPADE) tablet 50 mg  50 mg Oral Daily Adonis BrookSheila Agustin, NP   50 mg at 12/27/15 0801  . pneumococcal 23 valent vaccine (PNU-IMMUNE) injection 0.5 mL  0.5 mL Intramuscular Tomorrow-1000 Acquanetta SitElizabeth Woods Oates, MD      . propranolol (INDERAL) tablet 20 mg  20 mg Oral QHS Adonis BrookSheila Agustin, NP   20 mg at 12/26/15 2219  . QUEtiapine (SEROQUEL) tablet 25 mg  25 mg Oral QHS Adonis BrookSheila Agustin, NP   50 mg at 12/26/15 2242   PTA Medications: Prescriptions Prior  to Admission  Medication Sig Dispense Refill Last Dose  . acamprosate (CAMPRAL) 333 MG tablet Take 2 tablets (666 mg total) by mouth 3 (three) times daily. 180 tablet 2 12/22/2015 at Unknown time  . albuterol (PROAIR HFA) 108 (90 Base) MCG/ACT inhaler Inhale 1-2 puffs into the lungs every 4 (four) hours as needed for wheezing.    Past Week at Unknown time  . hydrOXYzine (VISTARIL) 50 MG capsule Take 1 capsule (50 mg total) by mouth 4 (four) times daily as needed for anxiety. 120 capsule 2 12/22/2015 at Unknown time  . mometasone-formoterol (DULERA) 100-5 MCG/ACT AERO Inhale 2 puffs into the lungs 2 (two) times daily. 1 Inhaler 0 12/22/2015 at Unknown time  . mupirocin cream (BACTROBAN) 2 % Apply 1 application topically 3 (three) times daily. Apply to affected area on nose (Patient not taking: Reported on 10/11/2015) 15 g 0 Not Taking  . mupirocin ointment (BACTROBAN) 2 % Place 1 application into the nose 3 (three) times daily. (Patient not taking: Reported on 10/11/2015) 22 g 0 Not Taking  . naltrexone (DEPADE) 50 MG tablet Take 1 tablet (50 mg total) by mouth daily. 30 tablet 2 12/22/2015 at Unknown time  . propranolol (INDERAL) 20 MG tablet Take 1 tablet 1/2 hour before bedtime meds 30 tablet 2 12/22/2015 at 2100  . QUEtiapine (SEROQUEL) 25 MG tablet Take 1-2  tablets QHS 60 tablet 0 12/22/2015 at Unknown time  . sulfamethoxazole-trimethoprim (BACTRIM DS,SEPTRA DS) 800-160 MG tablet Take 1 tablet by mouth 2 (two) times daily. (Patient not taking: Reported on 10/11/2015) 30 tablet 0 Not Taking    Patient Stressors: Financial difficulties Occupational concerns Substance abuse  Patient Strengths: Average or above average intelligence Capable of independent living Communication skills General fund of knowledge Motivation for treatment/growth  Treatment Modalities: Medication Management, Group therapy, Case management,  1 to 1 session with clinician, Psychoeducation, Recreational  therapy.   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) Long Term Goal(s): Improvement in symptoms so as ready for discharge Improvement in symptoms so as ready for discharge   Short Term Goals: Ability to identify changes in lifestyle to reduce recurrence of condition will improve Ability to verbalize feelings will improve Ability to disclose and discuss suicidal ideas Ability to demonstrate self-control will improve Ability to identify and develop effective coping behaviors will improve Ability to maintain clinical measurements within normal limits will improve Compliance with prescribed medications will improve Ability to identify triggers associated with substance abuse/mental health issues will improve Ability to identify changes in lifestyle to reduce recurrence of condition will improve Ability to verbalize feelings will improve Ability to disclose and discuss suicidal ideas Ability to demonstrate self-control will improve Ability to identify and develop effective coping behaviors will improve Ability to maintain clinical measurements within normal limits will improve Compliance with prescribed medications will improve Ability to identify triggers associated with substance abuse/mental health issues will improve  Medication Management: Evaluate patient's response, side effects, and tolerance of medication regimen.  Therapeutic Interventions: 1 to 1 sessions, Unit Group sessions and Medication administration.  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) Active Problems:   Generalized anxiety disorder   Alcohol use disorder, severe, dependence (HCC)  Long Term Goal(s): Improvement in symptoms so as ready for  discharge Improvement in symptoms so as ready for discharge   Short Term Goals: Ability to identify changes in lifestyle to reduce recurrence of condition will improve Ability to verbalize feelings will improve Ability to disclose and discuss suicidal ideas Ability to demonstrate self-control will improve Ability to identify and develop effective coping behaviors will improve Ability to maintain clinical measurements within normal limits will improve Compliance with prescribed medications will improve Ability to identify triggers associated with substance abuse/mental health issues will improve Ability to identify changes in lifestyle to reduce recurrence of condition will improve Ability to verbalize feelings will improve Ability to disclose and discuss suicidal ideas Ability to demonstrate self-control will improve Ability to identify and develop effective coping behaviors will improve Ability to maintain clinical measurements within normal limits will improve Compliance with prescribed medications will improve Ability to identify triggers associated with substance abuse/mental health issues will improve     Medication Management: Evaluate patient's response, side effects, and tolerance of medication regimen.  Therapeutic Interventions: 1 to 1 sessions, Unit Group sessions and Medication administration.  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 RN will report any adverse and/or side effects to prescribing provider.  Therapeutic Interventions: 1 on 1 counseling sessions, Psychoeducation, Medication administration, Evaluate responses to treatment, Monitor vital signs and CBGs as ordered, Perform/monitor CIWA, COWS, AIMS and Fall Risk screenings as ordered, Perform wound care treatments as ordered.  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  Short Term Goals: Engage patient in aftercare planning with referrals and resources, Facilitate patient progression through stages of change regarding substance use diagnoses and concerns and Increase skills for wellness and recovery  Therapeutic Interventions: Assess for all discharge needs, 1 to 1 time with Social worker, Explore available resources and support systems, Assess for adequacy in community support network, Educate family and significant other(s) on suicide prevention, Complete Psychosocial Assessment, Interpersonal group therapy.  Evaluation of Outcomes: Progressing   Progress in Treatment: Attending groups: No. Participating in groups: No. Taking medication as prescribed: Yes. Toleration medication: Yes. Family/Significant other contact made: No, will contact:  family member if patient consents Patient understands diagnosis: Yes. Discussing patient identified problems/goals with staff: Yes. Medical problems stabilized or resolved: Yes. Denies suicidal/homicidal ideation: Yes. Issues/concerns per patient self-inventory: No. Other: n/a  New problem(s) identified: No, Describe:  n/a  New Short Term/Long Term Goal(s):  Discharge Plan or  Barriers: CSW assessing for appropriate referrals. CDIOP with [facility] is point of contact.   Reason for Continuation of Hospitalization: Depression Medication stabilization Withdrawal symptoms  Estimated Length of Stay: 1-3 days   Attendees: Patient: 12/27/2015 9:02 AM  Physician: Dr. Mckinley Jewel MD 12/27/2015 9:02 AM  Nursing: Roxan Diesel RN 12/27/2015 9:02 AM  RN Care Manager: Onnie Boer CM 12/27/2015 9:02 AM  Social Worker: Chartered loss adjuster, LCSW 12/27/2015 9:02 AM  Recreational Therapist:  12/27/2015 9:02 AM  Other: Hillery Jacks NP; Armandina Stammer NP 12/27/2015 9:02 AM  Other:  12/27/2015 9:02 AM  Other: 12/27/2015 9:02 AM    Scribe for Treatment Team: Ledell Peoples Smart, LCSW 12/27/2015 9:02 AM

## 2015-12-27 NOTE — Progress Notes (Signed)
Patient ID: Ian PlowmanJonathan N [name], male   DOB: 08/12/1980, 35 y.o.   MRN: [id number]  DAR: Pt. Denies SI/HI and A/V Hallucinations. He reports sleep is fair, appetite is fair, energy level is low, and concentration is poor. She rates depression 6/10, hopelessness 4/10, and anxiety 7/10. He reports cravings, agitation, and irritability as part of his withdrawal symptoms. Patient reported that he wanted Ativan however per written order, patient did not meet criteria to receive this. MD Cobos was informed that patient wanted to have this order changed to meet his request. Patient does not report any pain at this time. Support and encouragement provided to the patient to come to writer with questions or concerns. Scheduled medications administered to patient per physician's orders. Patient is cooperative and overall pleasant during interaction. His affect is blunted and mood depressed. He is seen in the milieu intermittently. Q15 minute checks are maintained for safety.

## 2015-12-27 NOTE — Progress Notes (Signed)
Recreation Therapy Notes  Date: 12/27/15 Time: 0930 Location: 300 Dayroom  Group Topic: Stress Management  Goal Area(s) Addresses:  Patient will verbalize importance of using healthy stress management.  Patient will identify positive emotions associated with healthy stress management.   Intervention: Calm App  Activity :  Body Scan Meditation.  LRT introduced the stress management technique of meditation.  LRT played a body scan meditation from the Calm app to allow patients to participate in the technique.  Patients were to follow along to fully engage in the technique.  Education:  Stress Management, Discharge Planning.   Education Outcome: Acknowledges edcuation/In group clarification offered/Needs additional education  Clinical Observations/Feedback: Pt did not attend group.   Caroll RancherMarjette Darlyn Repsher, LRT/CTRS         Lillia AbedLindsay, Pola Furno A 12/27/2015 11:26 AM

## 2015-12-27 NOTE — Progress Notes (Signed)
Pt attended the evening AA speaker meeting. Pt was engaged and appropriate. Detrick Dani C, NT 12/27/15 9:47 PM 

## 2015-12-28 ENCOUNTER — Encounter (HOSPITAL_COMMUNITY): Payer: Self-pay | Admitting: Licensed Clinical Social Worker

## 2015-12-28 DIAGNOSIS — Z818 Family history of other mental and behavioral disorders: Secondary | ICD-10-CM

## 2015-12-28 MED ORDER — MOMETASONE FURO-FORMOTEROL FUM 100-5 MCG/ACT IN AERO: 2.0000 | INHALATION_SPRAY | Freq: Two times a day (BID) | RESPIRATORY_TRACT | 0 refills | Status: AC

## 2015-12-28 MED ORDER — HYDROXYZINE HCL 50 MG PO TABS: 50.0000 mg | ORAL_TABLET | Freq: Four times a day (QID) | ORAL | 0 refills | Status: DC | PRN

## 2015-12-28 MED ORDER — QUETIAPINE FUMARATE 25 MG PO TABS: 25.0000 mg | ORAL_TABLET | Freq: Every day | ORAL | 0 refills | Status: DC

## 2015-12-28 MED ORDER — GABAPENTIN 100 MG PO CAPS: 200.0000 mg | ORAL_CAPSULE | Freq: Three times a day (TID) | ORAL | 0 refills | Status: DC

## 2015-12-28 MED ORDER — PROPRANOLOL HCL 20 MG PO TABS: ORAL_TABLET | ORAL | 0 refills | Status: DC

## 2015-12-28 MED ORDER — NALTREXONE HCL 50 MG PO TABS: 50.0000 mg | ORAL_TABLET | Freq: Every day | ORAL | 0 refills | Status: DC

## 2015-12-28 MED ORDER — ALBUTEROL SULFATE HFA 108 (90 BASE) MCG/ACT IN AERS: 1.0000 | INHALATION_SPRAY | RESPIRATORY_TRACT | Status: AC | PRN

## 2015-12-28 MED ORDER — ACAMPROSATE CALCIUM 333 MG PO TBEC: 666.0000 mg | DELAYED_RELEASE_TABLET | Freq: Three times a day (TID) | ORAL | 0 refills | Status: DC

## 2015-12-28 NOTE — Progress Notes (Signed)
Patient ID: Ian PlowmanJonathan N [name], male   DOB: 03/16/1980, 35 y.o.   MRN: [id number]   Pt currently presents with a flat affect and cooperative behavior. Pt reports to writer that their goal is to "get some good sleep, I think that has been a problem for me." Pt attends groups tonight, interacts positively with peers. Reports cravings for alcohol, 5/10. Pt currently presents with HTN which patient reports he experiences only during periods of alcohol withdrawal. Pt reports good sleep with current medication regimen.   Pt provided with medications per providers orders. Pt's labs and vitals were monitored throughout the night. Pt supported emotionally and encouraged to express concerns and questions. Pt educated on medications.  Pt's safety ensured with 15 minute and environmental checks. Pt currently denies SI/HI and A/V hallucinations. Pt verbally agrees to seek staff if SI/HI or A/VH occurs and to consult with staff before acting on any harmful thoughts. Will continue POC.

## 2015-12-28 NOTE — Tx Team (Signed)
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 acamprosate (CAMPRAL) tablet 666 mg  666 mg Oral TID WC Kerrie Buffalo, NP   666 mg at 12/28/15 4098  . acetaminophen (TYLENOL) tablet 650 mg  650 mg Oral Q6H PRN Benjamine Mola, FNP      . alum & mag hydroxide-simeth (MAALOX/MYLANTA) 200-200-20 MG/5ML suspension 30 mL  30 mL Oral Q4H PRN Benjamine Mola, FNP      . gabapentin (NEURONTIN) capsule 200 mg  200 mg Oral TID Encarnacion Slates, NP   200 mg at 12/28/15 0841  . hydrOXYzine (ATARAX/VISTARIL) tablet 50 mg  50 mg Oral QID PRN Linard Millers, MD   50 mg at 12/28/15 0933  . ibuprofen (ADVIL,MOTRIN) tablet 800 mg  800 mg Oral Q6H PRN Derrill Center, NP   800 mg at 12/26/15 0754  . LORazepam (ATIVAN) tablet 1 mg  1 mg Oral Q6H PRN Kerrie Buffalo, NP   1 mg at 12/26/15 2219  . magnesium hydroxide (MILK OF MAGNESIA) suspension 30 mL  30 mL Oral Daily PRN Benjamine Mola, FNP      . mometasone-formoterol [facility]) 100-5 MCG/ACT inhaler 2 puff  2 puff Inhalation BID Kerrie Buffalo, NP   2 puff at 12/28/15 0932  . naltrexone (DEPADE) tablet 50 mg  50 mg Oral Daily Kerrie Buffalo, NP   50 mg at 12/28/15 0840  . pneumococcal 23 valent vaccine (PNU-IMMUNE) injection 0.5 mL  0.5 mL Intramuscular Tomorrow-1000 Linard Millers, MD      . propranolol (INDERAL) tablet 20 mg  20 mg Oral QHS Kerrie Buffalo, NP   20 mg at 12/27/15 2238  . QUEtiapine  (SEROQUEL) tablet 25 mg  25 mg Oral QHS Kerrie Buffalo, NP   25 mg at 12/27/15 2309   PTA Medications: Prescriptions Prior to Admission  Medication Sig Dispense Refill Last Dose  . hydrOXYzine (VISTARIL) 50 MG capsule Take 1 capsule (50 mg total) by mouth 4 (four) times daily as needed for anxiety. 120 capsule 2 12/22/2015 at Unknown time  . mupirocin cream (BACTROBAN) 2 % Apply 1 application topically 3 (three) times daily. Apply to affected area on nose (Patient not taking: Reported on 10/11/2015) 15 g 0 Not Taking  . mupirocin ointment (BACTROBAN) 2 % Place 1 application into the nose 3 (three) times daily. (Patient not taking: Reported on 10/11/2015) 22 g 0 Not Taking  . QUEtiapine (SEROQUEL) 25 MG tablet Take 1-2 tablets QHS 60 tablet 0 12/22/2015 at Unknown time  . sulfamethoxazole-trimethoprim (BACTRIM DS,SEPTRA DS) 800-160 MG tablet Take 1 tablet by mouth 2 (two) times daily. (Patient not taking: Reported on 10/11/2015) 30 tablet 0 Not Taking  . [DISCONTINUED] acamprosate (CAMPRAL) 333 MG tablet Take 2 tablets (666 mg total) by mouth 3 (three) times daily. 180 tablet 2 12/22/2015 at Unknown time  . [DISCONTINUED] albuterol (PROAIR HFA) 108 (90 Base) MCG/ACT inhaler Inhale 1-2 puffs into the lungs every 4 (four) hours as needed for wheezing.    Past Week at Unknown time  . [  DISCONTINUED] mometasone-formoterol (DULERA) 100-5 MCG/ACT AERO Inhale 2 puffs into the lungs 2 (two) times daily. 1 Inhaler 0 12/22/2015 at Unknown time  . [DISCONTINUED] naltrexone (DEPADE) 50 MG tablet Take 1 tablet (50 mg total) by mouth daily. 30 tablet 2 12/22/2015 at Unknown time  . [DISCONTINUED] propranolol (INDERAL) 20 MG tablet Take 1 tablet 1/2 hour before bedtime meds 30 tablet 2 12/22/2015 at 2100    Patient Stressors: Financial difficulties Occupational concerns Substance abuse  Patient Strengths: Average or above average intelligence Capable of independent living Wellsite geologist fund of  knowledge Motivation for treatment/growth  Treatment Modalities: Medication Management, Group therapy, Case management,  1 to 1 session with clinician, Psychoeducation, Recreational therapy.   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) Long Term Goal(s): Improvement in symptoms so as ready for discharge Improvement in symptoms so as ready for discharge   Short Term Goals: Ability to identify changes in lifestyle to reduce recurrence of condition will improve Ability to verbalize feelings will improve Ability to disclose and discuss suicidal ideas Ability to demonstrate self-control will improve Ability to identify and develop effective coping behaviors will improve Ability to maintain clinical measurements within normal limits will improve Compliance with prescribed medications will improve Ability to identify triggers associated with substance abuse/mental health issues will improve Ability to identify changes in lifestyle to reduce recurrence of condition will improve Ability to verbalize feelings will improve Ability to disclose and discuss suicidal ideas Ability to demonstrate self-control will improve Ability to identify and develop effective coping behaviors will improve Ability to maintain clinical measurements within normal limits will improve Compliance with prescribed medications will improve Ability to identify triggers associated with substance abuse/mental health issues will improve  Medication Management: Evaluate patient's response, side effects, and tolerance of medication regimen.  Therapeutic Interventions: 1 to 1 sessions, Unit Group sessions and Medication administration.  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  (HCC) Active Problems:   Generalized anxiety disorder   Alcohol use disorder, severe, dependence (HCC)  Long Term Goal(s): Improvement in symptoms so as ready for discharge Improvement in symptoms so as ready for discharge   Short Term Goals: Ability to identify changes in lifestyle to reduce recurrence of condition will improve Ability to verbalize feelings will improve Ability to disclose and discuss suicidal ideas Ability to demonstrate self-control will improve Ability to identify and develop effective coping behaviors will improve Ability to maintain clinical measurements within normal limits will improve Compliance with prescribed medications will improve Ability to identify triggers associated with substance abuse/mental health issues will improve Ability to identify changes in lifestyle to reduce recurrence of condition will improve Ability to verbalize feelings will improve Ability to disclose and discuss suicidal ideas Ability to demonstrate self-control will improve Ability to identify and develop effective coping behaviors will improve Ability to maintain clinical measurements within normal limits will improve Compliance with prescribed medications will improve Ability to identify triggers associated with substance abuse/mental health issues will improve     Medication Management: Evaluate patient's response, side effects, and tolerance of medication regimen.  Therapeutic Interventions: 1 to 1 sessions, Unit Group sessions and Medication administration.  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 RN will report any adverse and/or side effects to prescribing provider.  Therapeutic Interventions: 1 on 1 counseling sessions, Psychoeducation, Medication administration, Evaluate responses to treatment, Monitor vital signs and CBGs as ordered, Perform/monitor CIWA, COWS, AIMS and Fall Risk screenings as ordered, Perform wound care treatments as ordered.  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  Short Term Goals: Engage patient in aftercare planning with referrals and resources, Facilitate patient progression through stages of change regarding substance use diagnoses and concerns and Increase skills for wellness and recovery  Therapeutic Interventions: Assess for all discharge needs, 1 to 1 time with Social worker, Explore available resources and support systems, Assess for adequacy in community support network, Educate family and significant other(s) on suicide prevention, Complete Psychosocial Assessment, Interpersonal group therapy.  Evaluation of Outcomes: Met   Progress in Treatment: Attending groups: Yes Participating in groups: Yes Taking medication as prescribed: Yes. Toleration medication: Yes. Family/Significant other contact made: SPE completed with pt; pt declined to consent to family contact.  Patient understands diagnosis: Yes. Discussing patient identified problems/goals with staff: Yes. Medical problems stabilized or resolved: Yes. Denies suicidal/homicidal ideation: Yes. Issues/concerns  per patient self-inventory: No. Other: n/a  New problem(s) identified: No, Describe:  n/a  New Short Term/Long Term Goal(s):  Discharge Plan or Barriers:  CDIOP with [facility]-follow-up appt scheduled with Brandon Melnick for Friday 12/22 at [place] paperwork faxed to outpatient office per pt request: [phone]  Reason for Continuation of Hospitalization: none  Estimated Length of Stay: d/c today   Attendees: Patient: 12/28/2015 11:10 AM  Physician: Dr. Parke Poisson MD 12/28/2015 11:10 AM  Nursing: Chrys Racer RN 12/28/2015 11:10 AM  RN Care Manager: Lars Pinks CM 12/28/2015 11:10 AM  Social Worker: Maxie Better, LCSW; [place] Drinkard LCSW 12/28/2015 11:10 AM  Recreational Therapist:  12/28/2015 11:10 AM  Other: Samuel Jester NP; Lindell Spar NP 12/28/2015 11:10 AM  Other:  12/28/2015 11:10 AM  Other: 12/28/2015 11:10 AM    Scribe for Treatment Team: [place], LCSW 12/28/2015 11:10 AM

## 2015-12-28 NOTE — [facility] Suicide Risk Assessment (Signed)
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 Cellulitis and abscess [L03.90, L02.91] 02/15/2015  . Peritoneal cyst [K66.8] 02/15/2015  . 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 Alcoholic pancreatitis [K85.20] 02/08/2015  . Pancreatitis, alcoholic, acute [K85.20] 02/08/2015  . H/O acute alcoholic hepatitis [Z87.19] 02/08/2015  . 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 Alcohol dependence, daily use (HCC) [F10.20] 02/03/2015  . Alcohol intoxication in alcoholism with blood level over 0.3 with complication (HCC) [F10.229] 02/03/2015  . Hx of pancreatitis [Z87.19] 01/19/2015  . Prolonged Q-T interval on ECG [R94.31] 01/19/2015  . History of gastrointestinal disease [Z87.19] 01/19/2015  . Alcohol use disorder, severe, dependence (HCC) [F10.20] 08/23/2014  . Generalized anxiety disorder [F41.1] 08/14/2013  . Moderate episode of recurrent major depressive disorder (HCC) [F33.1] 08/14/2013  . ASTHMA [J45.909] 07/31/2008    Total Time spent with patient: 30 minutes  Musculoskeletal: Strength & Muscle Tone: within normal limits Gait & Station: normal Patient leans: N/A  Psychiatric Specialty Exam: ROSdenies headache, no chest pain, no shortness of breath,   Blood pressure 102/76, pulse 89, temperature 98.4 F (36.9 C), temperature source Oral, resp. rate 18, height 5\' 11"  (1.803 m), weight 198 lb (89.8 kg), SpO2 98 %.Body mass index is 27.62 kg/m.  General Appearance: Well Groomed  Eye Contact::  Good  Speech:  Normal Rate409  Volume:  Normal  Mood:  states he feels better, denies feeling depressed   Affect:  reports some  ongoing anxiety   Thought Process:  Linear  Orientation:  Full (Time, Place, and Person)  Thought Content:  denies hallucinations, no delusions, not internally preoccupied   Suicidal Thoughts:  No- denies any suicidal ideations , denies any self injurious ideations, denies any violent or homicidal ideations  Homicidal Thoughts:  No  Memory:  recent and remote grossly intact   Judgement:  Other:  improved   Insight:  improved   Psychomotor Activity:  Normal- no tremors, no diaphoresis  Concentration:  Good  Recall:  Good  Fund of Knowledge:Good  Language: Good  Akathisia:  Negative  Handed:  Right  AIMS (if indicated):     Assets:  Communication Skills Desire for Improvement Resilience  Sleep:  Number of Hours: 5.5  Cognition: WNL  ADL's:  Intact   Mental Status Per Nursing Assessment::   On Admission:  NA  Demographic Factors:  35 year old single male, one child who lives with mother, employed   Loss Factors: Relapse, medication non compliance   Historical Factors: History of alcohol dependence, history of anxiety, excessive worrying   Risk Reduction Factors:   Responsible for children under 34 years of age, Sense of responsibility to family, Employed and Positive coping skills or problem solving skills  Continued Clinical Symptoms:  Alert and attentive, well related, pleasant, mood is improved , affect is partially improved but reports some ongoing anxiety, no thought disorder, no hallucinations, no delusions, no suicidal ideations, no homicidal ideations, future oriented. No residual WDL symptoms. Denies medication side effects at this time.  Cognitive Features That Contribute To Risk:  No gross cognitive deficits noted upon discharge. Is alert , attentive, and oriented x 3   Suicide Risk:  Mild:  Suicidal ideation of limited frequency, intensity,  duration, and specificity.  There are no identifiable plans, no associated intent, mild dysphoria and related symptoms,  good self-control (both objective and subjective assessment), few other risk factors, and identifiable protective factors, including available and accessible social support.  Follow-up Information    [facility].   Why:  Social worker left message(s) with Charmian MuffAnn Evans in attempt to get time/date for follow-up. Please contact Charmian MuffAnn Evans at [phone] at discharge if appt not scheduled by then. Thank you.  Contact information: [address]  [address]  [place], [place] [address] [phone]          Plan Of Care/Follow-up recommendations:  Activity:  as tolerated  Diet:  Regular Tests:  NA Other:  See below  Patient is returning home  Plans to follow up as above  Plans to go to AA meetings regularly   Nehemiah MassedOBOS, Jorma Tassinari, MD 12/28/2015, 10:14 AM

## 2015-12-28 NOTE — Progress Notes (Signed)
  [facility] :  Will you be returning to the same living situation after discharge:  Yes,  home At discharge, do you have transportation home?: Yes,  car in parking lot Do you have the ability to pay for your medications: Yes,  [employer] private insurance  Release of information consent forms completed and submitted to medical records by CSW.  Patient to Follow up at: Follow-up Information    [facility] on 12/[facility]/2015.   Why:  Appt with Charmian MuffAnn Evans on this date at 9:00AM. Thank you.  Contact information: [address]  [address]  [place], [place] [address] [phone]          Next level of care provider has access to [facility] Link:no  Safety Planning and Suicide Prevention discussed: Yes,  SPE completed with pt; pt declined to consent to family contact. SPI pamphlet and Mobile Crisis information provided to pt and he was encouraged to share this information with support network.   Have you used any form of tobacco in the last 30 days? (Cigarettes, Smokeless Tobacco, Cigars, and/or Pipes): Yes  Has patient been referred to the Quitline?: Patient refused referral  Patient has been referred for addiction treatment: Yes  Evelise Reine N Smart LCSW 12/28/2015, 11:09 AM

## 2015-12-28 NOTE — Progress Notes (Signed)
Patient ID: Ian [name], male   DOB: Sep 02, 1980, 35 y.o.   MRN: [id number] Lorayne Bender, RN Registered Nurse Incomplete   Progress Notes Date of Service: 12/28/2015 10:22 AM      '[]'$ Hide copied text '[]'$ Hover for attribution information  DIS - CHARGE  NOTE  --  DC pt as ordered [facility] staff met with pt prior to DC.  All prescriptions were provided and explained . Pt agreed to attend all out-pt appointments.  All possessions were returned.  Pt denied pain, SI/HI/HA and agreed to contract for safety and to stay safe after DC.  ---  A ---  Escort pt to front lobby at  1150  Hrs., 12/28/15 .  ---   R  --  Pt was safe at time of DC

## 2015-12-28 NOTE — B[facility] Suicide Risk Assessment (Signed)
B[facility] INPATIENT:  Family/Significant Other Suicide Prevention Education  Suicide Prevention Education:  Patient Refusal for Family/Significant Other Suicide Prevention Education: The patient Ian [name] has refused to provide written consent for family/significant other to be provided Family/Significant Other Suicide Prevention Education during admission and/or prior to discharge.  Physician notified.  SPE completed with pt, as pt refused to consent to family contact. SPI pamphlet provided to pt and pt was encouraged to share information with support network, ask questions, and talk about any concerns relating to SPE. Pt denies access to guns/firearms and verbalized understanding of information provided. Mobile Crisis information also provided to pt.   Willaim Mode N Smart LCSW 12/28/2015, 8:14 AM

## 2015-12-28 NOTE — Progress Notes (Signed)
Pt came in for his 3pm appt. He was drunk and stated he had drank a half gallon of vodka. I took him over to the ED to be medically cleared for inpt detox.  Vernona RiegerLisbeth S. [name], LCAS

## 2015-12-28 NOTE — Discharge Summary (Signed)
Physician Discharge Summary Note  Patient:  Ian [name] is an 35 y.o., male  MRN:  [id number]  DOB:  1980-11-17  Patient phone:  [phone] (home)   Patient address:   [address]  [address] [place] [address],   Total Time spent with patient: Greater than 30 minutes  Date of Admission:  12/24/2015  Date of Discharge: 12/27/2015  Reason for Admission: Alcohol use disorder, severe, dependence requiring detoxification treatments  Principal Problem: Alcohol use disorder, severe, dependence [facility])  Discharge Diagnoses: Patient Active Problem List   Diagnosis Date Noted  . 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 Cellulitis and abscess [L03.90, L02.91] 02/15/2015  . Peritoneal cyst [K66.8] 02/15/2015  . Drug-induced mood disorder ([place]) [F19.94] 02/08/2015  . Alcoholic pancreatitis [I94.85] 02/08/2015  . Pancreatitis, alcoholic, acute [I62.70] 35/00/9381  . H/O acute alcoholic hepatitis [W29.93] 02/08/2015  . Alcohol dependence with alcohol-induced sleep disorder ([place]) [F10.282] 02/03/2015  . Alcohol dependence, daily use ([place]) [F10.20] 02/03/2015  . Alcohol intoxication in alcoholism with blood level over 0.3 with complication ([place]) [Z16.967] 02/03/2015  . Hx of pancreatitis [Z87.19] 01/19/2015  . Prolonged Q-T interval on ECG [R94.31] 01/19/2015  . History of gastrointestinal disease [Z87.19] 01/19/2015  . Alcohol use disorder, severe, dependence ([place]) [F10.20] 08/23/2014  . Generalized anxiety disorder [F41.1] 08/14/2013  . Moderate episode of recurrent major depressive disorder ([place]) [F33.1] 08/14/2013  . ASTHMA [J45.909] 07/31/2008   Musculoskeletal: Strength & Muscle Tone: within normal limits Gait & Station: normal Patient leans: N/A  Psychiatric Specialty Exam: Physical Exam  Vitals reviewed. Constitutional: He is oriented to person, place, and time. He appears  well-developed.  HENT:  Head: Normocephalic.  Eyes: Pupils are equal, round, and reactive to light.  Neck: Normal range of motion.  Cardiovascular: Normal rate.   Respiratory: Effort normal.  GI: Soft.  Genitourinary:  Genitourinary Comments: Deferred  Musculoskeletal: Normal range of motion.  Neurological: He is alert and oriented to person, place, and time.  Skin: Skin is warm and dry.    Review of Systems  Constitutional: Negative.   HENT: Negative.   Eyes: Negative.   Respiratory: Negative.   Cardiovascular: Negative.   Gastrointestinal: Negative.   Genitourinary: Negative.   Musculoskeletal: Negative.   Skin: Negative.   Neurological: Negative.   Endo/Heme/Allergies: Negative.   Psychiatric/Behavioral: Positive for depression (Stable) and substance abuse (Hx. Alcoholism, chronic). Negative for hallucinations, memory loss and suicidal ideas. The patient has insomnia (Stable). The patient is not nervous/anxious.   All other systems reviewed and are negative.   Blood pressure 102/76, pulse 89, temperature 98.4 F (36.9 C), temperature source Oral, resp. rate 18, height '5\' 11"'$  (1.803 m), weight 89.8 kg (198 lb), SpO2 98 %.Body mass index is 27.62 kg/m.  See Md's SRA  Have you used any form of tobacco in the last 30 days? (Cigarettes, Smokeless Tobacco, Cigars, and/or Pipes): Yes  Has this patient used any form of tobacco in the last 30 days? (Cigarettes, Smokeless Tobacco, Cigars, and/or Pipes) N/A  Past Medical History:  Past Medical History:  Diagnosis Date  . Alcohol dependence ([place])   . Anxiety   . Asthma   . Depression   . Hypertension   . Pancreatitis     Past Surgical History:  Procedure Laterality Date  . NO PAST SURGERIES     Family History:  Family History  Problem Relation Age of Onset  . OCD Mother   .  Heart disease Mother   . Suicidality Neg Hx    Social History:  History  Alcohol Use  . 36.0 oz/week  . 40 Shots of liquor per week     Comment: varies how much     History  Drug Use No    Social History   Social History  . Marital status: Single    Spouse name: N/A  . Number of children: N/A  . Years of education: N/A   Social History Main Topics  . Smoking status: Former Smoker    Types: Cigarettes    Quit date: 01/31/1999  . Smokeless tobacco: Never Used  . Alcohol use 36.0 oz/week    60 Shots of liquor per week     Comment: varies how much  . Drug use: No  . Sexual activity: Yes    Birth control/ protection: Condom   Other Topics Concern  . None   Social History Narrative  . None   Risk to Self: Is patient at risk for suicide?: No What has been your use of drugs/alcohol within the last 12 months?: Alcohol since about 6 weeks ago.  Started 2-3 weeks ago drinking daily. Risk to Others: No Prior Inpatient Therapy: Yes  Prior Outpatient Therapy: Yes  Level of Care:  OP  Hospital Course: Ian [name], 35 yo male, with history of substance abuse with depression and anxiety.  Patient had been sober for a year, however, patient had mismanagement with Rx management and refills which led to relapse and decompensation.   Patient was aware of his triggers and decided to seek help before condition     [name] was admitted to the hospital with his UDS test results positive Benzodiazepine & BAL of 380. He was presenting with substance withdrawal symptoms requiring detoxification treatments. He received Ativan detoxification treatment protocols. Besides the detoxification treatments, [name] also was medicated & discharged on; Hydroxyzine 50 mg prn for anxiety, naltrexone 50 mg for alcoholism, Gabapentin 200 mg for agitation/substance withdrawal symptoms, Campral 666 mg for alcoholism, propranolol 10 mg for anxiety/heart palpitation & Seroquel 25 mg for insomnia/mood control. He was resumed on all his pertinent home medications for all those other medical issues presented. He tolerated his treatment regimen without  any adverse effects or reactions reported. He was enrolled & participated in the group counseling sessions being & held on this unit. He learned coping skills.  [name] has completed detox treatments & his mood is stable. This is evidenced by his reports of improved mood & absence of substance withdrawal symptoms, suicidal ideations or hallucinations. He is currently being discharged to continue psychiatric care, medication management & counseling services on an outpatient basis as noted below. He was provided with all the necessary information needed to make this appointment without problems. He left [facility] in no apparent distress. Transportation per self.   Consults:  psychiatry  Discharge Vitals:   Blood pressure 102/76, pulse 89, temperature 98.4 F (36.9 C), temperature source Oral, resp. rate 18, height '5\' 11"'$  (1.803 m), weight 89.8 kg (198 lb), SpO2 98 %. Body mass index is 27.62 kg/m.  Lab Results:   Results for orders placed or performed during the hospital encounter of 12/24/15 (from the past 72 hour(s))  Rapid urine drug screen (hospital performed)     Status: Abnormal   Collection Time: 12/25/15  6:40 PM  Result Value Ref Range   Opiates NONE DETECTED NONE DETECTED   Cocaine NONE DETECTED NONE DETECTED   Benzodiazepines POSITIVE (A) NONE DETECTED  Amphetamines NONE DETECTED NONE DETECTED   Tetrahydrocannabinol NONE DETECTED NONE DETECTED   Barbiturates NONE DETECTED NONE DETECTED    Comment:        DRUG SCREEN FOR MEDICAL PURPOSES ONLY.  IF CONFIRMATION IS NEEDED FOR ANY PURPOSE, NOTIFY LAB WITHIN 5 DAYS.        LOWEST DETECTABLE LIMITS FOR URINE DRUG SCREEN Drug Class       Cutoff (ng/mL) Amphetamine      1000 Barbiturate      200 Benzodiazepine   297 Tricyclics       989 Opiates          300 Cocaine          300 THC              50 Performed at [facility]   TSH     Status: None   Collection Time: 12/26/15  6:29 AM  Result Value Ref Range    TSH 2.133 0.350 - 4.500 uIU/mL    Comment: Performed by a 3rd Generation assay with a functional sensitivity of <=0.01 uIU/mL. Performed at [facility]   Lipid panel     Status: None   Collection Time: 12/26/15  6:29 AM  Result Value Ref Range   Cholesterol 176 0 - 200 mg/dL   Triglycerides 82 <150 mg/dL   HDL 62 >40 mg/dL   Total CHOL/HDL Ratio 2.8 RATIO   VLDL 16 0 - 40 mg/dL   LDL Cholesterol 98 0 - 99 mg/dL    Comment:        Total Cholesterol/HDL:CHD Risk Coronary Heart Disease Risk Table                     Men   Women  1/2 Average Risk   3.4   3.3  Average Risk       5.0   4.4  2 X Average Risk   9.6   7.1  3 X Average Risk  23.4   11.0        Use the calculated Patient Ratio above and the CHD Risk Table to determine the patient's CHD Risk.        ATP III CLASSIFICATION (LDL):  <100     mg/dL   Optimal  100-129  mg/dL   Near or Above                    Optimal  130-159  mg/dL   Borderline  160-189  mg/dL   High  >190     mg/dL   Very High Performed at [facility]   Hemoglobin A1c     Status: None   Collection Time: 12/26/15  6:29 AM  Result Value Ref Range   Hgb A1c MFr Bld 5.4 4.8 - 5.6 %    Comment: (NOTE)         Pre-diabetes: 5.7 - 6.4         Diabetes: >6.4         Glycemic control for adults with diabetes: <7.0    Mean Plasma Glucose 108 mg/dL    Comment: (NOTE) Performed At: [facility] [place], [place] [id number] Lindon Romp MD [id number] Performed at [facility]   Prolactin     Status: Abnormal   Collection Time: 12/26/15  6:29 AM  Result Value Ref Range   Prolactin 16.2 (H) 4.0 - 15.2 ng/mL    Comment: (  NOTE) Performed At: [facility] [place], [place] [id number] Lindon Romp MD [id number] Performed at [facility]   CBC     Status: None   Collection Time: 12/26/15  6:29 AM  Result Value Ref Range   WBC 9.3 4.0 -  10.5 K/uL   RBC 4.92 4.22 - 5.81 MIL/uL   Hemoglobin 15.0 13.0 - 17.0 g/dL   HCT 43.3 39.0 - 52.0 %   MCV 88.0 78.0 - 100.0 fL   MCH 30.5 26.0 - 34.0 pg   MCHC 34.6 30.0 - 36.0 g/dL   RDW 14.0 11.5 - 15.5 %   Platelets 184 150 - 400 K/uL    Comment: Performed at [facility] metabolic panel     Status: Abnormal   Collection Time: 12/26/15  6:29 AM  Result Value Ref Range   Sodium 138 135 - 145 mmol/L   Potassium 3.4 (L) 3.5 - 5.1 mmol/L   Chloride 103 101 - 111 mmol/L   CO2 26 22 - 32 mmol/L   Glucose, Bld 100 (H) 65 - 99 mg/dL   BUN 11 6 - 20 mg/dL   Creatinine, Ser 0.75 0.61 - 1.24 mg/dL   Calcium 9.0 8.9 - 10.3 mg/dL   GFR calc non Af Amer >60 >60 mL/min   GFR calc Af Amer >60 >60 mL/min    Comment: (NOTE) The eGFR has been calculated using the CKD EPI equation. This calculation has not been validated in all clinical situations. eGFR's persistently <60 mL/min signify possible Chronic Kidney Disease.    Anion gap 9 5 - 15    Comment: Performed at [facility]   Physical Findings:  AIMS: Facial and Oral Movements Muscles of Facial Expression: None, normal Lips and Perioral Area: None, normal Jaw: None, normal Tongue: None, normal,Extremity Movements Upper (arms, wrists, hands, fingers): None, normal Lower (legs, knees, ankles, toes): None, normal, Trunk Movements Neck, shoulders, hips: None, normal, Overall Severity Severity of abnormal movements (highest score from questions above): None, normal Incapacitation due to abnormal movements: None, normal Patient's awareness of abnormal movements (rate only patient's report): No Awareness, Dental Status Current problems with teeth and/or dentures?: No Does patient usually wear dentures?: No  CIWA:  CIWA-Ar Total: 7 COWS:     See Psychiatric Specialty Exam and Suicide Risk Assessment completed by Attending Physician prior to discharge.  Discharge destination:  Home  Is  patient on multiple antipsychotic therapies at discharge:  No   Has Patient had three or more failed trials of antipsychotic monotherapy by history:  No  Recommended Plan for Multiple Antipsychotic Therapies: NA  Allergies as of 12/28/2015   No Known Allergies     Medication List    STOP taking these medications   hydrOXYzine 50 MG capsule Commonly known as:  VISTARIL   mupirocin cream 2 % Commonly known as:  BACTROBAN   mupirocin ointment 2 % Commonly known as:  BACTROBAN   sulfamethoxazole-trimethoprim 800-160 MG tablet Commonly known as:  BACTRIM DS,SEPTRA DS     TAKE these medications     Indication  acamprosate 333 MG tablet Commonly known as:  CAMPRAL Take 2 tablets (666 mg total) by mouth 3 (three) times daily. For alcoholism What changed:  additional instructions  Indication:  Excessive Use of Alcohol   albuterol 108 (90 Base) MCG/ACT inhaler Commonly known as:  PROAIR HFA Inhale 1-2 puffs into the lungs every 4 (four) hours as needed for wheezing.  Indication:  Chronic Obstructive Lung Disease   gabapentin 100 MG capsule Commonly known as:  NEURONTIN Take 2 capsules (200 mg total) by mouth 3 (three) times daily. For agitation/substance withdrawal syndrome  Indication:  Agitation, Alcohol Withdrawal Syndrome   hydrOXYzine 50 MG tablet Commonly known as:  ATARAX/VISTARIL Take 1 tablet (50 mg total) by mouth 4 (four) times daily as needed for anxiety.  Indication:  Anxiety Neurosis   mometasone-formoterol 100-5 MCG/ACT Aero Commonly known as:  DULERA Inhale 2 puffs into the lungs 2 (two) times daily. For asthma What changed:  additional instructions  Indication:  Asthma   naltrexone 50 MG tablet Commonly known as:  DEPADE Take 1 tablet (50 mg total) by mouth daily. For alcoholism What changed:  additional instructions  Indication:  Excessive Use of Alcohol   propranolol 20 MG tablet Commonly known as:  INDERAL Take 1 tablet 1/2 hour before bedtime  medications: For irregular heart beats What changed:  additional instructions  Indication:  High Blood Pressure Disorder, Supraventricular Cardiac Arrhythmia, Performance Anxiety   QUEtiapine 25 MG tablet Commonly known as:  SEROQUEL Take 1 tablet (25 mg total) by mouth at bedtime. For mood control What changed:  how much to take  how to take this  when to take this  additional instructions  Indication:  Mood control      Follow-up [facility] Follow up.   Why:  Social worker left message(s) with Brandon Melnick in attempt to get time/date for follow-up. Please contact Brandon Melnick at [phone] at discharge if appt not scheduled by then. Thank you.  Contact information: [address]  [place]  [place], [place] [address] [phone]         Follow-up recommendations: Activity:  As tolerated Diet: As recommended by your primary care doctor. Keep all scheduled follow-up appointments as recommended.   Comments: Patient is instructed prior to discharge to: Take all medications as prescribed by his/her mental healthcare provider. Report any adverse effects and or reactions from the medicines to his/her outpatient provider promptly. Patient has been instructed & cautioned: To not engage in alcohol and or illegal drug use while on prescription medicines. In the event of worsening symptoms, patient is instructed to call the crisis hotline, 911 and or go to the nearest ED for appropriate evaluation and treatment of symptoms. To follow-up with his/her primary care provider for your other medical issues, concerns and or health care needs.  Signed: [place], [place], FNP-BC 12/28/2015, 10:54 AM  Patient seen, Suicide Assessment Completed.  Disposition Plan Reviewed

## 2015-12-29 ENCOUNTER — Ambulatory Visit (HOSPITAL_COMMUNITY): Payer: Self-pay | Admitting: Licensed Clinical Social Worker

## 2015-12-31 ENCOUNTER — Ambulatory Visit (HOSPITAL_COMMUNITY): Payer: BLUE CROSS/BLUE SHIELD | Admitting: Psychology

## 2015-12-31 ENCOUNTER — Encounter (HOSPITAL_COMMUNITY): Payer: Self-pay | Admitting: Psychology

## 2015-12-31 DIAGNOSIS — F331 Major depressive disorder, recurrent, moderate: Secondary | ICD-10-CM

## 2015-12-31 DIAGNOSIS — Z8719 Personal history of other diseases of the digestive system: Secondary | ICD-10-CM

## 2015-12-31 DIAGNOSIS — F1020 Alcohol dependence, uncomplicated: Secondary | ICD-10-CM

## 2016-01-05 ENCOUNTER — Other Ambulatory Visit (HOSPITAL_COMMUNITY): Payer: Self-pay | Admitting: Medical

## 2016-01-05 ENCOUNTER — Telehealth (HOSPITAL_COMMUNITY): Payer: Self-pay | Admitting: Psychology

## 2016-01-05 ENCOUNTER — Other Ambulatory Visit (HOSPITAL_COMMUNITY): Payer: BLUE CROSS/BLUE SHIELD

## 2016-01-05 ENCOUNTER — Ambulatory Visit (HOSPITAL_COMMUNITY): Payer: Self-pay | Admitting: Licensed Clinical Social Worker

## 2016-01-05 NOTE — Progress Notes (Signed)
FMLA paperwork completed.

## 2016-01-06 ENCOUNTER — Other Ambulatory Visit (HOSPITAL_COMMUNITY): Payer: BLUE CROSS/BLUE SHIELD | Attending: Psychiatry | Admitting: Psychology

## 2016-01-06 DIAGNOSIS — F1020 Alcohol dependence, uncomplicated: Secondary | ICD-10-CM

## 2016-01-07 ENCOUNTER — Other Ambulatory Visit (HOSPITAL_COMMUNITY): Payer: BLUE CROSS/BLUE SHIELD

## 2016-01-07 ENCOUNTER — Encounter (HOSPITAL_COMMUNITY): Payer: Self-pay | Admitting: Psychology

## 2016-01-07 NOTE — Progress Notes (Signed)
    Daily Group Progress Note  Program: CD-IOP   01/07/2016 Ian [name] [id number]  Diagnosis:  No diagnosis found.   Sobriety Date: 12/26  Group Time: 1-2:30 pm  Participation Level: Active  Behavioral Response: Appropriate and Sharing  Type of Therapy: Process Group  Interventions: Supportive  Topic: Process: the first half of group was spent in process. Members shared what they had done to support their sobriety since we met yesterday as well as any challenges or temptations they had faced. A new group member appeared in this session and he introduced himself to his new fellow group members. He received a warm welcome. A drug test was collected from him.   Group Time: 2:30-4 pm  Participation Level: Active  Behavioral Response: Appropriate  Type of Therapy: Psycho-education Group  Interventions: Strength-based  Topic: Psycho-Ed: the second half of group was spent in a psycho-ed. Handouts were provided that included 'Relationship Maps'. The focus was on identifying the people that group members are in relationship with and determining, by 'mapping them', how significant they are in their daily lives. Members were asked to share what they had drawn and explain how they night hope to change them in the future. As the session ended, members shared about their plans over the New Year's weekend. There are some parties at [employer] and there was a lively discussion about attending them. The group will meet next on Wednesday, January 12, 2016.  Summary: The patient was new to group today and shared about himself and what has brought him here. The group was very attentive and supportive to this man who was in obvious emotional pain over his relapse after almost nine months of sobriety. He appeared at ease with disclosing these recent events in his life. In the psycho-ed, the patient identified his 70 yo son and his parents as very important in his "person to person map". He  noted that despite his parents' disappointment and the hurtful comments they make towards him, they are still very important because they have a lot of power and influence over his life. The patient shared briefly about his resistance to commitment and it was agreed that this is an area that must be addressed because it can leave him vulnerable and exposed in his recovery. The patient made some helpful comments and he responded well to his first group session.   UDS collected: Yes Results: pending  AA/NA attended?:no  Sponsor?: No   Brandon Melnick, LCAS 01/07/2016 10:50 AM

## 2016-01-10 IMAGING — CT CT ABD-PELV W/ CM
1 of 2 series · 14 of 32 positions shown, 18 images · IV contrast (OMNIPAQUE 300)
Comparison: Prior abdominal ultrasound 08/06/2009

CLINICAL DATA: 33-year-old male with mid abdominal pain, nausea and
vomiting for 3 days. Medical history includes alcohol abuse.

EXAM:
CT ABDOMEN AND PELVIS WITH CONTRAST
TECHNIQUE: Multidetector CT imaging of the abdomen and pelvis was performed
using the standard protocol following bolus administration of
intravenous contrast.
CONTRAST:  100mL OMNIPAQUE IOHEXOL 300 MG/ML  SOLN

[Series 2: abd/pel with · axial · 0.82mm/px · z∈[-342,+138]mm · 14 of 110 slices shown, 18 images]
[im 9/110  soft-tissue]
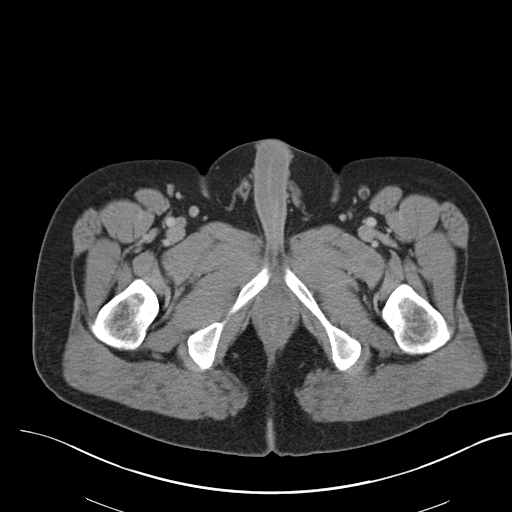
[im 9/110  bone]
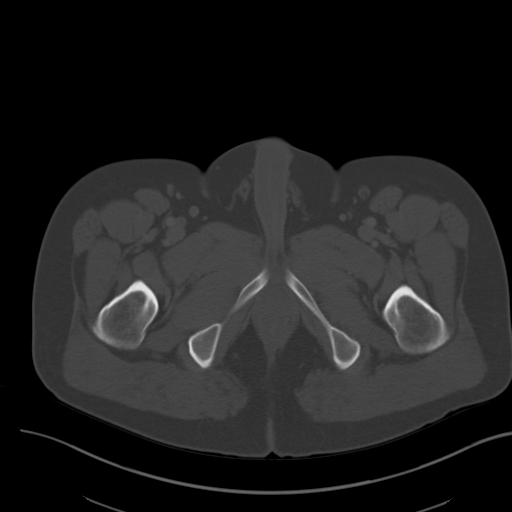
[im 18/110  soft-tissue]
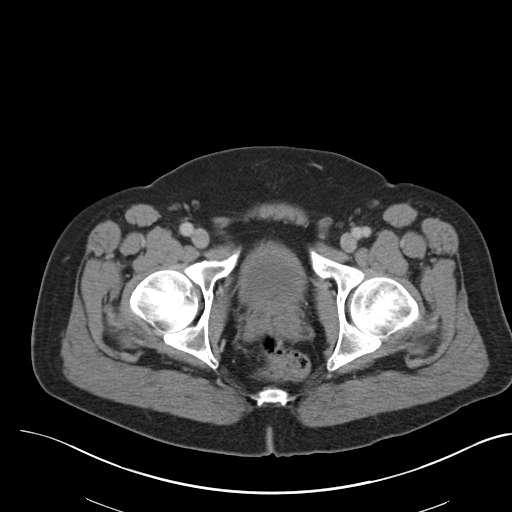
[im 27/110  soft-tissue]
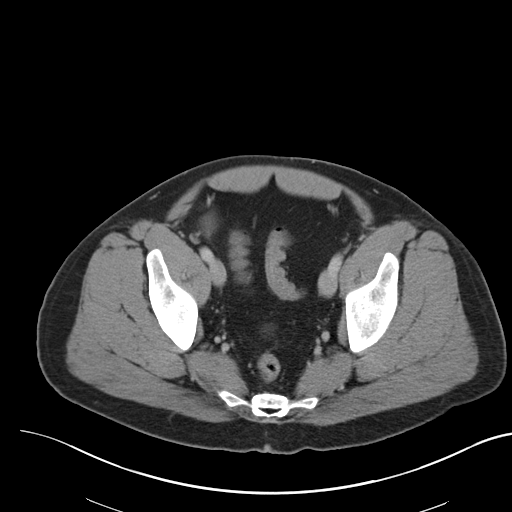
[im 35/110  soft-tissue]
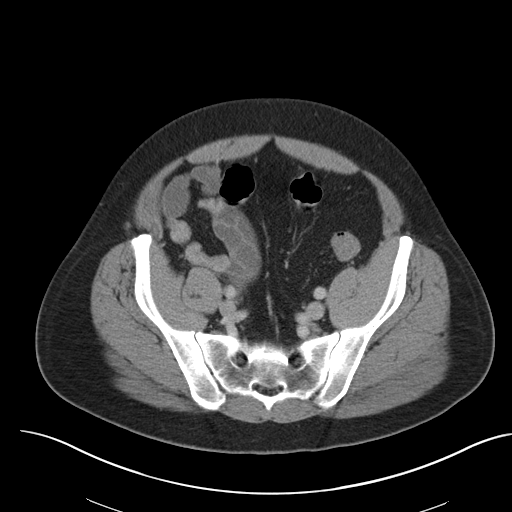
[im 44/110  soft-tissue]
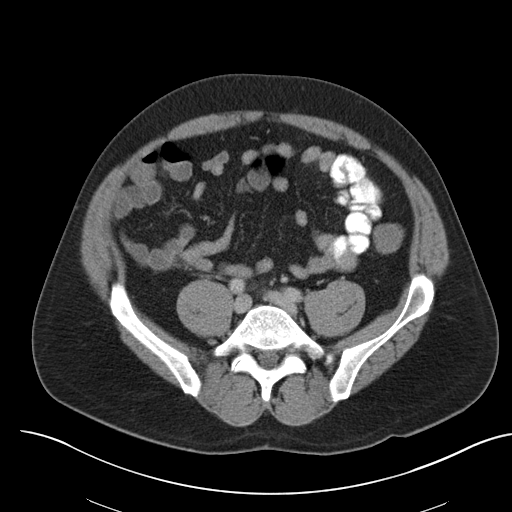
[im 53/110  soft-tissue]
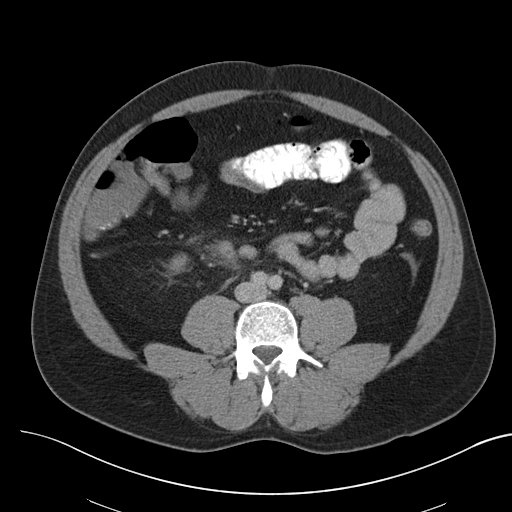
[im 62/110  soft-tissue]
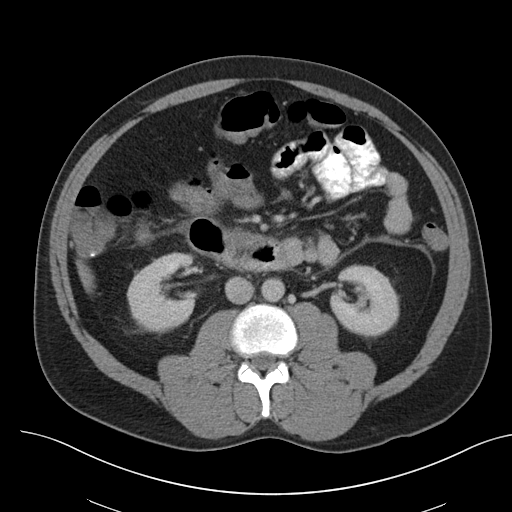
[im 70/110  soft-tissue]
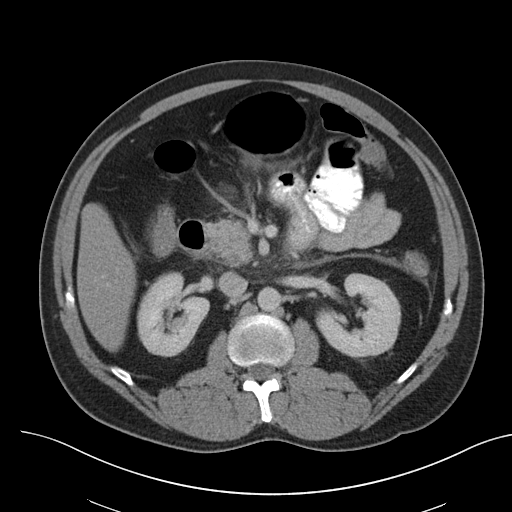
[im 79/110  soft-tissue]
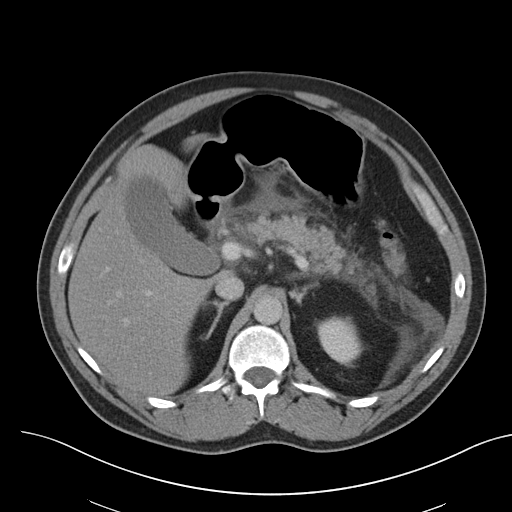
[im 79/110  bone]
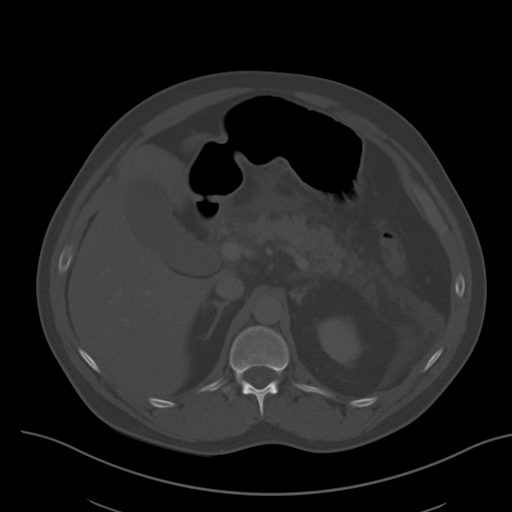
[im 88/110  soft-tissue]
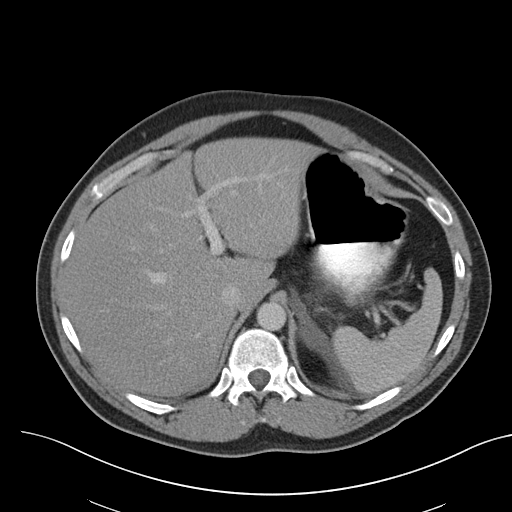
[im 92/110  lung]
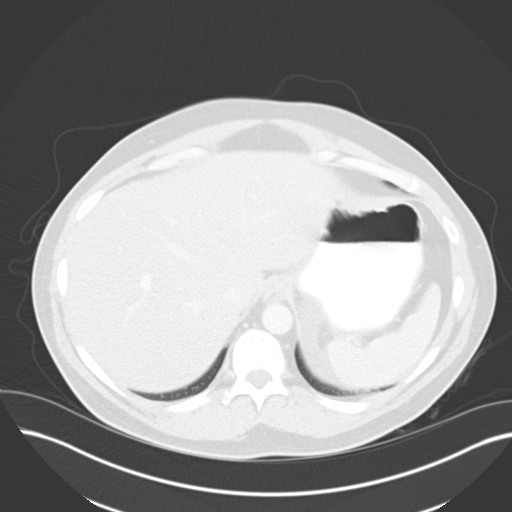
[im 96/110  soft-tissue]
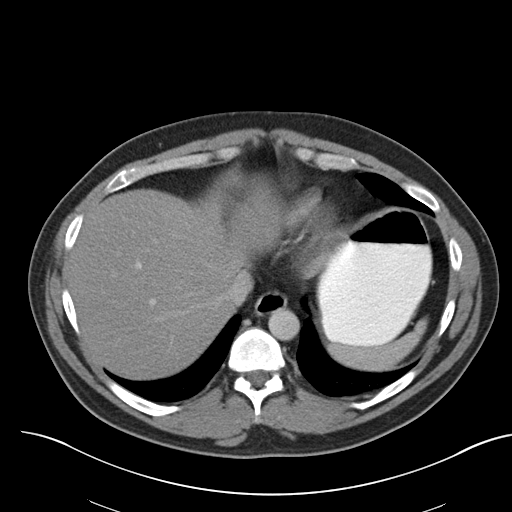
[im 96/110  lung]
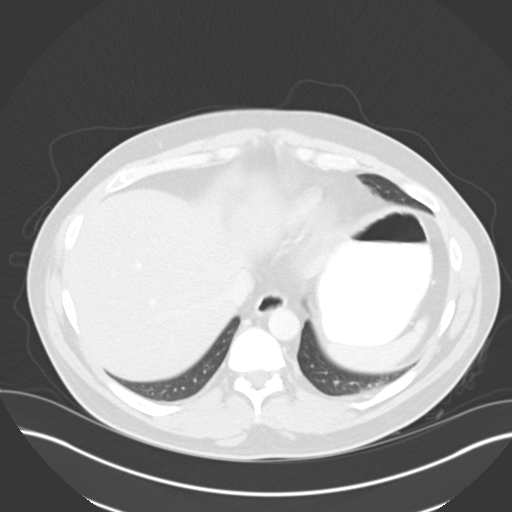
[im 101/110  lung]
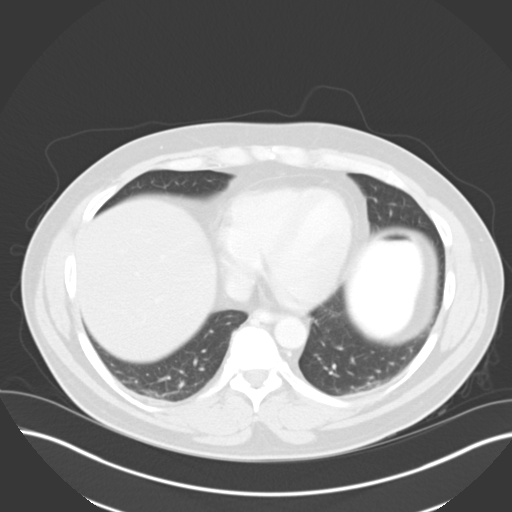
[im 105/110  soft-tissue]
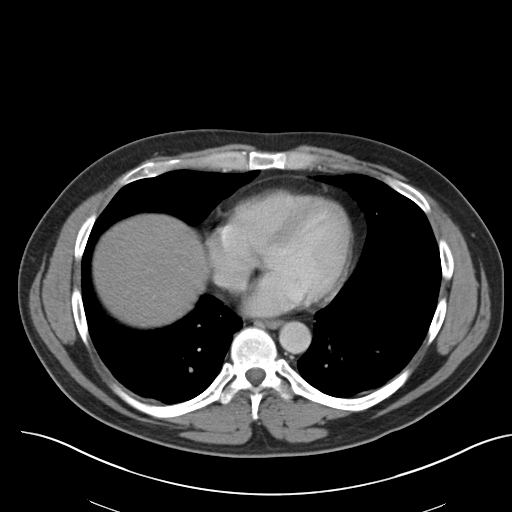
[im 105/110  lung]
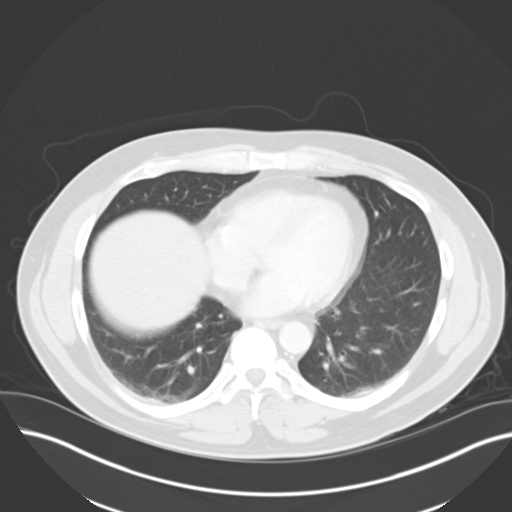

[14 of 32 positions shown; findings below may reference images not displayed]

FINDINGS: Lower Chest: The lung bases are clear. Visualized cardiac structures
are within normal limits for size. No pericardial effusion.
Unremarkable visualized distal thoracic esophagus.

Abdomen: Unremarkable CT appearance of the stomach, spleen and
adrenal glands. The pancreas appears edematous, particularly in the
pancreatic head and uncinate process, and also at the junction of
the pancreatic body and tail. There is extensive inflammatory
stranding throughout the peripancreatic fat as well as
peripancreatic fluid which tracks up into the left subdiaphragmatic
space and down the the bilateral pericolic gutters. Edema extends
into the gastrosplenic ligament and root of the mesentery. No
discrete fluid collection to suggest pseudocyst or abscess.

Normal hepatic contour and morphology. Gallbladder is unremarkable.
No intra or extrahepatic biliary ductal dilatation. Unremarkable
appearance of the bilateral kidneys. No focal solid lesion,
hydronephrosis or nephrolithiasis.

Mild circumferential thickening of the descending duodenum is likely
reactive. No evidence of bowel obstruction. Normal appendix in the
right hemi abdomen. No evidence of free air.

Pelvis: Unremarkable bladder, prostate gland and seminal vesicles.
No free fluid or suspicious adenopathy.

Bones/Soft Tissues: No acute fracture or aggressive appearing lytic
or blastic osseous lesion.

Vascular: Normal patency of the splenic and portal veins. No
evidence of significant atherosclerotic plaque or aneurysm.
Incidental note is made of a circumaortic left renal vein.
IMPRESSION: 1. CT findings are most consistent with acute uncomplicated
pancreatitis. There is a moderate amount of peripancreatic stranding
and fluid extending into the root of the mesentery, the porta
hepatis, the left greater than right pericolic gutters and the
gastrosplenic ligament. No evidence of pancreatic necrosis,
pseudocyst or vascular complication.

## 2016-01-11 ENCOUNTER — Other Ambulatory Visit (HOSPITAL_COMMUNITY): Payer: BLUE CROSS/BLUE SHIELD

## 2016-01-12 ENCOUNTER — Other Ambulatory Visit (HOSPITAL_COMMUNITY): Payer: BLUE CROSS/BLUE SHIELD | Attending: Psychiatry | Admitting: Psychology

## 2016-01-12 DIAGNOSIS — K852 Alcohol induced acute pancreatitis without necrosis or infection: Secondary | ICD-10-CM | POA: Insufficient documentation

## 2016-01-12 DIAGNOSIS — F1020 Alcohol dependence, uncomplicated: Secondary | ICD-10-CM

## 2016-01-12 DIAGNOSIS — F1994 Other psychoactive substance use, unspecified with psychoactive substance-induced mood disorder: Secondary | ICD-10-CM | POA: Insufficient documentation

## 2016-01-12 DIAGNOSIS — F411 Generalized anxiety disorder: Secondary | ICD-10-CM | POA: Insufficient documentation

## 2016-01-12 DIAGNOSIS — R748 Abnormal levels of other serum enzymes: Secondary | ICD-10-CM | POA: Insufficient documentation

## 2016-01-12 DIAGNOSIS — Z8719 Personal history of other diseases of the digestive system: Secondary | ICD-10-CM | POA: Insufficient documentation

## 2016-01-13 ENCOUNTER — Other Ambulatory Visit (HOSPITAL_COMMUNITY): Payer: BLUE CROSS/BLUE SHIELD | Admitting: Psychology

## 2016-01-13 DIAGNOSIS — F331 Major depressive disorder, recurrent, moderate: Secondary | ICD-10-CM

## 2016-01-13 DIAGNOSIS — F411 Generalized anxiety disorder: Secondary | ICD-10-CM

## 2016-01-13 DIAGNOSIS — F1020 Alcohol dependence, uncomplicated: Secondary | ICD-10-CM

## 2016-01-14 ENCOUNTER — Other Ambulatory Visit (HOSPITAL_COMMUNITY): Payer: BLUE CROSS/BLUE SHIELD

## 2016-01-16 ENCOUNTER — Encounter (HOSPITAL_COMMUNITY): Payer: Self-pay | Admitting: Psychology

## 2016-01-16 NOTE — Progress Notes (Signed)
Daily Group Progress Note  Program: CD-IOP   01/16/2016 Annabell Sabal [id number]  Diagnosis:  No diagnosis found.   Sobriety Date: 12/26  Group Time: 1-3:30 pm  Participation Level: Active  Behavioral Response: Appropriate and Sharing  Type of Therapy: Process Group  Interventions: Supportive  Topic: Process: The first half of group was spent in process. Members shared about the long holiday weekend and the challenges and temptations they presented themselves. They also identified what they had done over the weekend to support and strengthen their recovery. Drug tests were collected from everyone and the program director met one member for discharge today and another member for her initial visit. A new group member was present.   Group Time: 3:30-4 pm  Participation Level: Active  Behavioral Response: Sharing  Type of Therapy: Process Group  Interventions: Supportive  Topic: Process/Graduation: The second half of group was much shorter today due to the lengthy time spent in the first half. There was much to be discussed since we had last met 6 days ago. The new group member introduced herself. The counselor assisted her in sharing why she was here. This new member received a warm welcome from the group and invitation to join one of the women tomorrow morning for an AA meeting. A graduation ceremony was held to honor a member who was successfully completing the program today. Brownies, the graduation medallion and words of admiration and hope were shared with her as she leaves the program.   Summary: The patient reported that he had a 'pretty good, but stressful weekend'. The patient reported he had driven his son down to [place] for a big go-cart race that he was competing in. it was a long race and his son finished second. They drove back after midnight, but he felt very good about their time together and it was the first out-of-town trip he has driven with his son  in almost four years. He noted that his son was very talkative on the drive home and agreed he probably had a lot of adrenaline from the 4+ hour race. The patient was very proud of this. He shared that before he relapsed, he had attained almost 10 months of sobriety. And, "the obsession with alcohol had left me'. Now the obsession has returned, and he realizes what a gift it was to not be thinking about it so often. The patient reported he attended 5 AA meetings since we last met, but he has not picked up a starter chip yet. When asked why not, the patient couldn't really explain it. He attended church this past Sunday at [place], but noted he might start going to [employer] He explained that he needs to get into some sort of young people's group at a church. It will help him stay sober and on track. The patient provided feedback about routine and explained he is working very hard to get back into his routine. He had a good one going when he got sober and it made his daily life do much better. The patient got through the holiday weekend and stayed sober. He was very disappointed to hear that he would not be meeting with the program director today and, as a result, will not get anything signed to return to work. He expressed frustration and fear that he would lose his job. The counselor assured him he would meet with the program director on Monday and return to work as soon as he and Clinical biochemist think it  is appropriate. The patient maintained that getting back to work will keep him occupied and allow him to more easily get back into a healthy routine. He responded well to this intervention and appears more willing to let go of doing things 'his way' and more willing to follow someone's else's lead. The patient had kind words for the graduating member. He noted that they had known each other a number of years by being in the rooms. He encouraged her to remain true to herself and share when she is beginning to slip. He  agreed they would continue to see each other in meetings.    UDS collected: No Results: negative  AA/NA attended?: YesMonday, Tuesday, Thursday, Friday and Saturday  Sponsor?: No   Brandon Melnick, LCAS 01/16/2016 2:40 PM

## 2016-01-17 ENCOUNTER — Ambulatory Visit (HOSPITAL_COMMUNITY): Payer: Self-pay | Admitting: Medical

## 2016-01-17 ENCOUNTER — Other Ambulatory Visit (HOSPITAL_COMMUNITY): Payer: BLUE CROSS/BLUE SHIELD

## 2016-01-17 NOTE — Progress Notes (Signed)
Ian PlowmanJonathan N [name] is a 36 y.o. male patient. Orientation to CD-IOP: The patient is a 36 yo single, white, male seeking entry into the CD-IOP. He lives alone here in [place]. The patient was [facility] after a 3-day alcohol detox. The patient graduated from this program in April of 2017 and had attained 9 months of sobriety before returning to use. The patient has a full-time position at [employer] and has moved up the ranks in less than six months. In looking back at his relapse, the patient admitted he became complacent, stopped going to AA meetings and was not speaking with his sponsor with any frequency. In late October, he drove to [place] by himself to watch his 35 yo son compete in a go-cart race. He stopped in [place] and purchased two half gallons of vodka. He managed to keep his use hidden for a while and continued to work, but earlier this month, the patient realized things were falling apart. He called out of work and contacted his Network engineerindividual counselor here at [facility]. The patient reported that in October there was a lapse in his Campral medication that reduces alcohol cravings. He feels as if this was when things started going awry. The patient had recently had all restrictions on driving with his son lifted and things were going better than they had for years. He denied any thoughts of self-sabotage. The patient splits custody of his son with the son's mother and when father and son are together, they spent most of their time at his parent's house. His parents have supported their son with the many financial burdens that have resulted from his alcoholism, including DWI's (2007, 2013) and other legal issues. They also split expenses for their grandson, including tuition at [employer]. They are very upset and frustrated with their son. The patient's mother is often verbally abusive and demeaning towards her son and this is a source of great shame for  him. When encouraged to get counseling or go to Al-Anon, his mother just waves her checkbook and reminds him that this is all his fault. In addition to alcohol dependence, the patient is being treated for major depressive disorder and GAD. The patient has had numerous treatment experiences and those include residential treatment at [facility] along with outpatient treatments. The patient's longest period of sobriety is one year, which he attained in 2012 after [facility], a [facility] residential facility in [place], [place]. The patient was very eager to get back into the program, rebuild his accountable and get back into the routines that were helping him remain sober. The orientation was completed accordingly and the patient will return on Wednesday, December 27, and begin the CD-IOP.        Charmian MuffAnn Corky Blumstein, LCAS

## 2016-01-18 ENCOUNTER — Other Ambulatory Visit (HOSPITAL_COMMUNITY): Payer: BLUE CROSS/BLUE SHIELD

## 2016-01-18 NOTE — Progress Notes (Signed)
    Daily Group Progress Note  Program: CD-IOP   01/18/2016 Ian [name] [id number]  Diagnosis:  Alcohol use disorder, severe, dependence ([place])  GAD (generalized anxiety disorder)  Major depressive disorder, recurrent episode, moderate ([employer])   Sobriety Date: 01/04/16  Group Time: 1-2:30  Participation Level: Active  Behavioral Response: Appropriate and Sharing  Type of Therapy: Process Group  Interventions: CBT  Topic: Counselors met with patients for 1.5 hour group process in which pts checked in about their sobriety date, recovery related behaviors and actions, meeting attendance, medications, activities of daily living, and challenges to their sobriety. One member completed successfully and participated in a completion ceremony.     Group Time: 2:30-4  Participation Level: Active  Behavioral Response: Appropriate and Sharing  Type of Therapy: Psycho-education Group  Interventions: Other: powerlessness and 12 steps  Topic: Counselors met with patients for 1.5 hour group psychoeducation session in which pts discussed the 12 Step idea of "powerlessness" and the difference between it and weakness. Counselors discussed cultural implications and empowered pts to see "victory through surrender". Patients were agreeable and discussed the topic at length as it related to their own personal lives.    Summary: Patient was active and engaged during session. He stated he did not attend an [place] meeting since yesterday but he did call his sponsor, as he does daily. He presented as somewhat hesitant to share openly about his shame and disappointment for returning to CD-IOP. He shared about his upcoming plans he has for returning to work so he can "get on a good schedule". He also shared about a disagreement he had with his son's mother in which he was able to stay calm, rationale, and not take her "jabs too personally". The group congratulated him on his progress. He reported on  his upcoming weekend and decision he made to take his son on a short trip that could be triggering but he wanted to let "as many people know" so he would feel safe. Patient did not have significant plans for weekend other than trip and counselor encouraged him to plan more specifically. Pt stated he does feel powerless over alcohol and knows that he cannot drink again.  Youlanda Roys, LPCA  UDS collected: No Results: negative  AA/NA attended?: No  Sponsor?: Yes   Brandon Melnick, LCAS 01/18/2016 11:22 AM

## 2016-01-19 ENCOUNTER — Telehealth (HOSPITAL_COMMUNITY): Payer: Self-pay | Admitting: Licensed Clinical Social Worker

## 2016-01-19 ENCOUNTER — Encounter (HOSPITAL_COMMUNITY): Payer: Self-pay | Admitting: Licensed Clinical Social Worker

## 2016-01-19 ENCOUNTER — Other Ambulatory Visit (HOSPITAL_COMMUNITY): Payer: BLUE CROSS/BLUE SHIELD

## 2016-01-19 NOTE — Telephone Encounter (Signed)
Counselor spoke with pt mother, since mother is listed as emergency contact. Counselor confirmed mother's recent interaction with patient and mother stated she felt safe leaving pt at home alone. Counselor expressed concern that patient was not returning calls in over 48 hours and had not made any attempt to contact CD-IOP team about his whereabouts. Mother confirmed pt was safe and he hopes to attend group tomorrow as scheduled. 4:30-4:45pm

## 2016-01-20 ENCOUNTER — Other Ambulatory Visit (HOSPITAL_COMMUNITY): Payer: BLUE CROSS/BLUE SHIELD

## 2016-01-21 ENCOUNTER — Encounter (HOSPITAL_COMMUNITY): Payer: Self-pay | Admitting: Psychology

## 2016-01-21 ENCOUNTER — Other Ambulatory Visit (HOSPITAL_COMMUNITY): Payer: BLUE CROSS/BLUE SHIELD

## 2016-01-22 ENCOUNTER — Encounter (HOSPITAL_COMMUNITY): Payer: Self-pay

## 2016-01-22 ENCOUNTER — Emergency Department (HOSPITAL_COMMUNITY)
Admission: EM | Admit: 2016-01-22 | Discharge: 2016-01-22 | Disposition: A | Discharge status: Home / Self Care | Payer: BLUE CROSS/BLUE SHIELD | Attending: Emergency Medicine | Admitting: Emergency Medicine

## 2016-01-22 DIAGNOSIS — I10 Essential (primary) hypertension: Secondary | ICD-10-CM | POA: Diagnosis not present

## 2016-01-22 DIAGNOSIS — Z5321 Procedure and treatment not carried out due to patient leaving prior to being seen by health care provider: Secondary | ICD-10-CM | POA: Diagnosis not present

## 2016-01-22 DIAGNOSIS — J45909 Unspecified asthma, uncomplicated: Secondary | ICD-10-CM | POA: Insufficient documentation

## 2016-01-22 DIAGNOSIS — F10239 Alcohol dependence with withdrawal, unspecified: Secondary | ICD-10-CM | POA: Diagnosis present

## 2016-01-22 DIAGNOSIS — Z79899 Other long term (current) drug therapy: Secondary | ICD-10-CM | POA: Insufficient documentation

## 2016-01-22 DIAGNOSIS — Z87891 Personal history of nicotine dependence: Secondary | ICD-10-CM | POA: Diagnosis not present

## 2016-01-22 LAB — COMPREHENSIVE METABOLIC PANEL
ALT: 82 U/L — AB (ref 17–63)
AST: 118 U/L — ABNORMAL HIGH (ref 15–41)
Albumin: 4.7 g/dL (ref 3.5–5.0)
Alkaline Phosphatase: 70 U/L (ref 38–126)
Anion gap: 13 (ref 5–15)
BUN: 18 mg/dL (ref 6–20)
CALCIUM: 8.6 mg/dL — AB (ref 8.9–10.3)
CO2: 22 mmol/L (ref 22–32)
CREATININE: 0.75 mg/dL (ref 0.61–1.24)
Chloride: 101 mmol/L (ref 101–111)
GFR calc non Af Amer: >60 mL/min (ref >60)
GLUCOSE: 130 mg/dL — AB (ref 65–99)
Potassium: 4.3 mmol/L (ref 3.5–5.1)
SODIUM: 136 mmol/L (ref 135–145)
Total Bilirubin: 1 mg/dL (ref 0.3–1.2)
Total Protein: 8.5 g/dL — ABNORMAL HIGH (ref 6.5–8.1)

## 2016-01-22 LAB — CBC
HCT: 52.4 % — ABNORMAL HIGH (ref 39.0–52.0)
Hemoglobin: 19.4 g/dL — ABNORMAL HIGH (ref 13.0–17.0)
MCH: 31.8 pg (ref 26.0–34.0)
MCHC: 37 g/dL — AB (ref 30.0–36.0)
MCV: 85.9 fL (ref 78.0–100.0)
PLATELETS: 252 10*3/uL (ref 150–400)
RBC: 6.1 MIL/uL — AB (ref 4.22–5.81)
RDW: 13.6 % (ref 11.5–15.5)
WBC: 7.8 10*3/uL (ref 4.0–10.5)

## 2016-01-22 NOTE — ED Triage Notes (Signed)
Pt BIB [facility] from home. He states that he wants help to detox from alcohol. He states that he has been drinking wine all day. Denies SI/HI.

## 2016-01-22 NOTE — ED Notes (Signed)
Pt called to be roomed without response.  RN notified. 

## 2016-01-22 NOTE — ED Notes (Signed)
Pt called to be roomed with no response x2.  RN notified.

## 2016-01-24 ENCOUNTER — Other Ambulatory Visit (HOSPITAL_COMMUNITY): Payer: BLUE CROSS/BLUE SHIELD

## 2016-01-25 ENCOUNTER — Other Ambulatory Visit (HOSPITAL_COMMUNITY): Payer: BLUE CROSS/BLUE SHIELD

## 2016-01-26 ENCOUNTER — Other Ambulatory Visit (HOSPITAL_COMMUNITY): Payer: BLUE CROSS/BLUE SHIELD

## 2016-01-27 ENCOUNTER — Other Ambulatory Visit (HOSPITAL_COMMUNITY): Payer: BLUE CROSS/BLUE SHIELD

## 2016-01-28 ENCOUNTER — Encounter (HOSPITAL_COMMUNITY): Payer: Self-pay | Admitting: Licensed Clinical Social Worker

## 2016-01-28 ENCOUNTER — Other Ambulatory Visit (HOSPITAL_COMMUNITY): Payer: BLUE CROSS/BLUE SHIELD

## 2016-01-28 DIAGNOSIS — F1020 Alcohol dependence, uncomplicated: Secondary | ICD-10-CM

## 2016-01-28 DIAGNOSIS — F411 Generalized anxiety disorder: Secondary | ICD-10-CM

## 2016-01-28 DIAGNOSIS — F331 Major depressive disorder, recurrent, moderate: Secondary | ICD-10-CM

## 2016-01-31 ENCOUNTER — Telehealth (HOSPITAL_COMMUNITY): Payer: Self-pay | Admitting: Licensed Clinical Social Worker

## 2016-01-31 ENCOUNTER — Other Ambulatory Visit (INDEPENDENT_AMBULATORY_CARE_PROVIDER_SITE_OTHER): Payer: BLUE CROSS/BLUE SHIELD | Admitting: Psychology

## 2016-01-31 ENCOUNTER — Encounter (HOSPITAL_COMMUNITY): Payer: Self-pay | Admitting: Licensed Clinical Social Worker

## 2016-01-31 ENCOUNTER — Encounter (HOSPITAL_COMMUNITY): Payer: Self-pay | Admitting: Medical

## 2016-01-31 VITALS — BP 112/76 | HR 86 | Ht 71.5 in | Wt 207.0 lb

## 2016-01-31 DIAGNOSIS — Z8719 Personal history of other diseases of the digestive system: Secondary | ICD-10-CM

## 2016-01-31 DIAGNOSIS — F411 Generalized anxiety disorder: Secondary | ICD-10-CM

## 2016-01-31 DIAGNOSIS — F1994 Other psychoactive substance use, unspecified with psychoactive substance-induced mood disorder: Secondary | ICD-10-CM

## 2016-01-31 DIAGNOSIS — K852 Alcohol induced acute pancreatitis without necrosis or infection: Secondary | ICD-10-CM | POA: Diagnosis not present

## 2016-01-31 DIAGNOSIS — R748 Abnormal levels of other serum enzymes: Secondary | ICD-10-CM | POA: Diagnosis not present

## 2016-01-31 DIAGNOSIS — F1020 Alcohol dependence, uncomplicated: Secondary | ICD-10-CM | POA: Diagnosis not present

## 2016-01-31 MED ORDER — NALTREXONE HCL 50 MG PO TABS: 50.0000 mg | ORAL_TABLET | Freq: Every day | ORAL | 2 refills | Status: AC

## 2016-01-31 MED ORDER — PROPRANOLOL HCL 20 MG PO TABS: ORAL_TABLET | ORAL | 0 refills | Status: AC

## 2016-01-31 MED ORDER — HYDROXYZINE HCL 50 MG PO TABS: 50.0000 mg | ORAL_TABLET | Freq: Four times a day (QID) | ORAL | 0 refills | Status: AC | PRN

## 2016-01-31 MED ORDER — ACAMPROSATE CALCIUM 333 MG PO TBEC: 666.0000 mg | DELAYED_RELEASE_TABLET | Freq: Four times a day (QID) | ORAL | 1 refills | Status: AC

## 2016-01-31 MED ORDER — QUETIAPINE FUMARATE 50 MG PO TABS: 50.0000 mg | ORAL_TABLET | Freq: Two times a day (BID) | ORAL | 2 refills | Status: AC

## 2016-01-31 MED ORDER — GABAPENTIN 300 MG PO CAPS: 300.0000 mg | ORAL_CAPSULE | Freq: Three times a day (TID) | ORAL | 1 refills | Status: AC

## 2016-01-31 NOTE — Telephone Encounter (Signed)
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

## 2016-01-31 NOTE — Progress Notes (Addendum)
Psychiatric Initial Adult Assessment   Patient Identification: Ian [name] MRN:  [id number] Date of Evaluation:  01/31/2016 Referral Source:Fernado Cobos MD;Beth Georgianne Fick Chief Complaint:   Chief Complaint    Establish Care; Follow-up; Alcohol Problem; Anxiety; Depression     Visit Diagnosis:    ICD-9-CM ICD-10-CM   1. Alcohol use disorder, severe, dependence ([place]) 303.90 F10.20   2. GAD (generalized anxiety disorder) 300.02 F41.1   3. Substance induced mood disorder (HCC) 292.84 F19.94   4. H/O acute alcoholic hepatitis T73.22 Z87.19   5. Alcohol-induced acute pancreatitis without infection or necrosis 577.0 K85.20   6. Elevated liver enzymes 790.5 R74.8     History of Present Illness: Pt is a 36 YO WM well known to this service having successfully completed CDIOP in 2017:      [place] [facility]ensive Outpatient Discharge Summary     Ian [name] [id number] Date of Admission: 02/01/2015 Date of Discharge: 04/22/2015 Course of Treatment: Pt has done well in treatment and will complete as successful.He has been able to maintain abstinence for the longest period in recent times. He has met the requirements of meetings;group and individual counseling sessions and is to meet for sponsorship today. He has found employment .He has found Naltrexone and Campral extremely helpful for cravings. Goals and Activities to Help Maintain Sobriety: 1. Stay away from old friends who continue to drink and use mind-altering chemicals. 2. Continue practicing Fair Fighting rules in interpersonal conflicts. 3. Continue alcohol and drug refusal skills and call on support systems. 4.   Continue Medications   Referrals: Pending match to appropriate clinicians Aftercare services: [facility] OP Tuesdays at 5:00 pm 1. Attend AA/NA meetings 4 times per week.minimum.Recommend 90 meetings in 90 days initially 2. Obtain a sponsor and a home group in [place]. 3.  Referrals pending 4. 3 month RX medications provided Next appointment: Aftercare Plan of Action to Address Continuing Problems:as above  Client has participated in the development of this discharge plan and has received a copy of this completed plan  Darlyne Russian  04/21/2015 Darlyne Russian, PA-C 04/21/2015     Pt did well Attending aftercare and AA until October of 2017 when his attendance dropped off and became irregular.He subsequently revealed he returned to drinking in October while travling to [place] to watch his son compete in a Go cart race. He stopped in [place] and and purchased 2 1/2 Gallons of Vodka. He admits he quit going to [place] and calling his sponsor prior to his relapse as well.He managed to hide his relapse only briefly and on Dec 14th showed up for his counseling appt with Blood alcohol of .380. He was sent to the ED and subsequently admitted to [facility]:    Physician Discharge Summary Note   Patient:  Ian [name] is an 36 y.o., male  MRN:  [id number] DOB:  [birth year] Patient phone:  [phone] (home)          Patient address:   [place]  [address] [place] [address],           Total Time spent with patient: Greater than 30 minutes Date of Admission:  12/24/2015  Date of Discharge: 12/27/2015 Reason for Admission: Alcohol use disorder, severe, dependence requiring detoxification treatments Principal Problem: Alcohol use disorder, severe, dependence [facility]  Discharge Diagnoses:     Patient Active Problem List    Diagnosis Date Noted  . 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 Cellulitis and abscess [L03.90, L02.91] 02/15/2015  . Peritoneal cyst [K66.8] 02/15/2015  . Drug-induced mood disorder ([place]) [F19.94] 02/08/2015  . Alcoholic pancreatitis [F81.82] 02/08/2015  . Pancreatitis, alcoholic, acute [X93.71] 69/67/8938  . H/O acute alcoholic hepatitis [B01.75] 02/08/2015  .  Alcohol dependence with alcohol-induced sleep disorder ([place]) [F10.282] 02/03/2015  . Alcohol dependence, daily use ([place]) [F10.20] 02/03/2015  . Alcohol intoxication in alcoholism with blood level over 0.3 with complication ([place]) [Z02.585] 02/03/2015  . Hx of pancreatitis [Z87.19] 01/19/2015  . Prolonged Q-T interval on ECG [R94.31] 01/19/2015  . History of gastrointestinal disease [Z87.19] 01/19/2015  . Alcohol use disorder, severe, dependence ([place]) [F10.20] 08/23/2014  . Generalized anxiety disorder [F41.1] 08/14/2013  . Moderate episode of recurrent major depressive disorder ([place]) [F33.1] 08/14/2013  . ASTHMA [J45.909]    TAKE these medications     Indication  acamprosate 333 MG tablet Commonly known as:  CAMPRAL Take 2 tablets (666 mg total) by mouth 3 (three) times daily. For alcoholism What changed:  additional instructions   Indication:  Excessive Use of Alcohol    albuterol 108 (90 Base) MCG/ACT inhaler Commonly known as:  PROAIR HFA Inhale 1-2 puffs into the lungs every 4 (four) hours as needed for wheezing.   Indication:  Chronic Obstructive Lung Disease    gabapentin 100 MG capsule Commonly known as:  NEURONTIN Take 2 capsules (200 mg total) by mouth 3 (three) times daily. For agitation/substance withdrawal syndrome   Indication:  Agitation, Alcohol Withdrawal Syndrome    hydrOXYzine 50 MG tablet Commonly known as:  ATARAX/VISTARIL Take 1 tablet (50 mg total) by mouth 4 (four) times daily as needed for anxiety.   Indication:  Anxiety Neurosis    mometasone-formoterol 100-5 MCG/ACT Aero Commonly known as:  DULERA Inhale 2 puffs into the lungs 2 (two) times daily. For asthma What changed:  additional instructions   Indication:  Asthma    naltrexone 50 MG tablet Commonly known as:  DEPADE Take 1 tablet (50 mg total) by mouth daily. For alcoholism What changed:  additional instructions   Indication:  Excessive Use of Alcohol    propranolol 20 MG tablet Commonly  known as:  INDERAL Take 1 tablet 1/2 hour before bedtime medications: For irregular heart beats What changed:  additional instructions   Indication:  High Blood Pressure Disorder, Supraventricular Cardiac Arrhythmia, Performance Anxiety    QUEtiapine 25 MG tablet Commonly known as:  SEROQUEL Take 1 tablet (25 mg total) by mouth at bedtime. For mood control What changed:  how much to take  how to take this  when to take this  additional instructions   Indication:  Mood control            Follow-up [place] [facility] Follow up.   Why:  Social worker left message(s) with Brandon Melnick in attempt to get time/date for follow-up. Please contact Brandon Melnick at [phone] at discharge if appt not scheduled by then. Thank you.  Contact information: [address]  [place]  [place], [place] [address] [phone]         He was evaluated on 12/22 for CDIOP:  Ian [name] is a 36 y.o. male patient. Orientation to CD-IOP: The patient is a 36 yo single, white, male seeking entry into the CD-IOP. He lives alone here in [place]. The patient was discharged from [facility] on December 18 after a 3-day alcohol detox. The patient  graduated from this program in April of 2017 and had attained 9 months of sobriety before returning to use. The patient has a full-time position at [employer] and has moved up the ranks in less than six months. In looking back at his relapse, the patient admitted he became complacent, stopped going to [place] meetings and was not speaking with his sponsor with any frequency. In late October, he drove to [place] by himself to watch his 59 yo son compete in a go-cart race. He stopped in [place] and purchased two half gallons of vodka. He managed to keep his use hidden for a while and continued to work, but earlier this month, the patient realized things were falling apart. He called out of work and contacted his Secretary/administrator  here at [facility] outpatient clinic. The patient reported that in October there was a lapse in his Campral medication that reduces alcohol cravings. He feels as if this was when things started going awry. The patient had recently had all restrictions on driving with his son lifted and things were going better than they had for years. He denied any thoughts of self-sabotage. The patient splits custody of his son with the son's mother and when father and son are together, they spent most of their time at his parent's house. His parents have supported their son with the many financial burdens that have resulted from his alcoholism, including DWI's (2007, 2013) and other legal issues. They also split expenses for their grandson, including tuition at [employer]. They are very upset and frustrated with their son. The patient's mother is often verbally abusive and demeaning towards her son and this is a source of great shame for him. When encouraged to get counseling or go to Al-Anon, his mother just waves her checkbook and reminds him that this is all his fault. In addition to alcohol dependence, the patient is being treated for major depressive disorder and GAD. The patient has had numerous treatment experiences and those include residential treatment at [facility] along with outpatient treatments. The patient's longest period of sobriety is one year, which he attained in 2012 after living at [facility], a men's residential facility in [place], [place]. The patient was very eager to get back into the program, rebuild his accountable and get back into the routines that were helping him remain sober. The orientation was completed accordingly and the patient will return on Wednesday, December 27, and begin the CD-IOP.  Pt attended 3 sessions and disappeared after Jan 4 Group. On Jan 10 his mother was contacted as his emergency contact for safety check.Mother staed he was safe and would be returning but did  not. He presented to ED Jan13 but when called did not respond. On Jan19  pt reappeared ::  CD-IOP INDIVIDUAL COUNSELING SESSION 1PM-1:50PM Patient was active and engaged in session. He presented as anxious, agitated, and tearful throughout session. Patient shared about his week in which he missed his appointments for CD-IOP due to actively drinking alcohol. Patient reported he had not drank alcohol since Monday Jan. 15. He stated he had been staying at his parents house for the week to stay safe and try to attempt to come back to group. Patient and counselor discussed pt hx of returning to use when "things are going well". Pt stated firmly he does not like drinking and wants to quit but he cannot refuse the cravings. Counselor used MI to increase pt awareness of his ambivalence about gaining sobriety. Pt discussed his  fear of not having a job upon returning to work. Counselor and pt created multiple strategies for contacting pt supervisor and discussing next steps for his return to work. Pt stated he felt better upon leaving session and he hoped to call his supervisor today.  He returned to Group today and admits he is "shakey". 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  He expressed a desire to receive an RX for Antabuse but was unfamiliar with the drug and the necessary protocol for proper prescribing which involves exposing patient to small amount of alcohol while taking drug to experience effect of drug as well as signing a legal liability form. At present there is no protocol in place at [facility] OP and Dr Dwyane Dee has ordered it to be developed.  Pt was less interested after protocol was explained but advised he could pursue this when it is established if still interested and if he is felt to be a candidate.    (There is mention of a lapse in Campral pt feels contributed to his relapse but review of medications  doesnt show any lapse in coverage prescribing medications including Campral unless pt failed to pickup refill in timely fashion.Also it was explauined to the patient when originally prescribed that [place] is an aide to AA;counseling etc and does  NOT keep people from drinking)  Associated Signs/Symptoms: Depression Symptoms:  PHQ 9 score 11 (Hypo) Manic Symptoms:  NA Anxiety Symptoms:  GAD 7 score 17 Psychotic Symptoms:  Hallucinations: Visual when drinking and sleep deprived PTSD Symptoms:NA  Past Psychiatric History:   Inpatient Alcohol/Substance Abuse Treatment Hx: Past Tx, If yes, describe treatment: [facility], [facility] Jan 2012,Jun 2013,Aug 2016,Jan 2017 Dec 2017  Detox andOutpatient. .  Has alcohol/substance abuse ever caused legal problems?: Yes-DUI's. No recent court dates.   Prior Outpatient Therapy Prior Outpatient Therapy: Yes Prior Therapy Dates: 4/13 [facility]; [facility] OP CDIOP 01/2015;OP Counseling 04/2015-Dec20 17 Prior Therapy  Previous Psychotropic Medications: No   Substance Abuse History in the last 12 months:  Yes.     Substance Age of 1st Use Last Use Amount Specific Type  Nicotine 16 2001    Alcohol 16 01/25/2016 1/2 gallon Vodka  Cannabis 19 2003 smoked MJ in college  Opiates na     Cocaine na     Methamphetamines na     LSD na     Ecstasy na     Benzodiazepines na     Caffeine na     Inhalants na     Others: na                       :  Consequences of Substance Abuse: Medical Consequences:  Alcoholic Hepatitis and pancreatitis-W/D syndrome Legal Consequences:  NA Family Consequences:  Mother berates/family upset Blackouts:  Yes Withdrawal Symptoms:   Diaphoresis Nausea Tremors  Past Medical History:  Past Medical History:  Diagnosis Date  . Alcohol dependence ([place])   . Anxiety   . Asthma   . Depression   . Hypertension   . Pancreatitis     Past Surgical History:  Procedure  Laterality Date  . NO PAST SURGERIES      Family Psychiatric History:   Family History:  Family History  Problem Relation Age of Onset  . OCD Mother   . Heart disease Mother   . Suicidality Neg Hx  Social History:   Social History   Social History  . Marital status: Single    Spouse name: N/A  . Number of children: 1  . Years of education: Highest grade of school patient has completed: 71   Social History Main Topics  . Smoking status: Former Smoker    Types: Cigarettes    Quit date: 01/31/1999  . Smokeless tobacco: Never Used  . Alcohol use 36.0 oz/week    60 Shots of liquor per week     Comment: varies how much  . Drug use: No  . Sexual activity: Yes    Birth control/ protection: Condom   Other Topics Concern  . None   Social History Narrative  . Marital status: Single Does patient have children?: Yes How many children?: 1  Childhood History:  By whom was/is the patient raised?: Both parents Additional childhood history information: Parents divorced at pt's age 16 Description of patient's relationship with caregiver when they were a child: "fine" Patient's description of current relationship with people who raised him/her: Srained with mother, father and step father Does patient have siblings?: Yes Number of Siblings: 1  Description of patient's current relationship with siblings: "Not great with older sister" Did patient suffer any verbal/emotional/physical/sexual abuse as a child?: No Did patient suffer from severe childhood neglect?: No Has patient ever been sexually abused/assaulted/raped as an adolescent or adult?: No Was the patient ever a victim of a crime or a disaster?: No Witnessed domestic violence?: No Has patient been effected by domestic violence as an adult?: No How is patient's relationship with their child Good Living/Environment/Situation:  Living Arrangements: Alone, Children, Parent (Lives in own apartment alone.  When has son, will  stay at parents) Living conditions (as described by patient or guardian): Comfortable, safe How long has patient lived in current situation?: 13 months What is atmosphere in current home: Comfortable hildren?: Patient reports good relationship w 54 YO son.      Additional Social History:  Allergies:  No Known Allergies  Metabolic Disorder Labs: Lab Results  Component Value Date   HGBA1C 5.4 12/26/2015   MPG 108 12/26/2015   MPG 123 12/24/2014   Lab Results  Component Value Date   PROLACTIN 16.2 (H) 12/26/2015   PROLACTIN 14.2 01/20/2015   Lab Results  Component Value Date   CHOL 176 12/26/2015   TRIG 82 12/26/2015   HDL 62 12/26/2015   CHOLHDL 2.8 12/26/2015   VLDL 16 12/26/2015   LDLCALC 98 12/26/2015   LDLCALC 80 01/20/2015     Current Medications: Current Outpatient Prescriptions  Medication Sig Dispense Refill  . acamprosate (CAMPRAL) 333 MG tablet Take 2 tablets (666 mg total) by mouth 4 (four) times daily. 240 tablet 1  . albuterol (PROAIR HFA) 108 (90 Base) MCG/ACT inhaler Inhale 1-2 puffs into the lungs every 4 (four) hours as needed for wheezing.    . DULERA 200-5 MCG/ACT AERO   4  . gabapentin (NEURONTIN) 300 MG capsule Take 1 capsule (300 mg total) by mouth 3 (three) times daily. For agitation/substance withdrawal syndrome 90 capsule 1  . hydrOXYzine (ATARAX/VISTARIL) 50 MG tablet Take 1 tablet (50 mg total) by mouth 4 (four) times daily as needed for anxiety. 60 tablet 0  . mometasone-formoterol (DULERA) 100-5 MCG/ACT AERO Inhale 2 puffs into the lungs 2 (two) times daily. For asthma 1 Inhaler 0  . montelukast (SINGULAIR) 10 MG tablet   4  . naltrexone (DEPADE) 50 MG tablet Take 1 tablet (50 mg  total) by mouth daily. For alcoholism 30 tablet 2  . propranolol (INDERAL) 20 MG tablet Take 1/2 tablet BID PRN and 1 tablet 1/2 hour before bedtime medications: For Palpitations/anxiety 30 tablet 0  . QUEtiapine (SEROQUEL) 50 MG tablet Take 1 tablet (50 mg total) by  mouth 2 (two) times daily. 60 tablet 2   No current facility-administered medications for this visit.     Neurologic: Headache: Negative Seizure: Negative Paresthesias:Negative   Psychiatric Specialty Exam: Review of Systems  Constitutional: Negative for chills, diaphoresis, fever, malaise/fatigue and weight loss.  HENT: Negative for congestion, ear discharge, ear pain, hearing loss, nosebleeds, sinus pain, sore throat and tinnitus.   Eyes: Negative for blurred vision, double vision, photophobia, pain, discharge and redness.  Respiratory: Positive for wheezing (Asthma). Negative for cough, hemoptysis, sputum production, shortness of breath and stridor.   Cardiovascular: Positive for palpitations (Seroquel induced). Negative for chest pain, orthopnea, claudication, leg swelling and PND.  Gastrointestinal: Negative for abdominal pain, blood in stool, constipation, diarrhea, heartburn, melena, nausea and vomiting.  Genitourinary: Negative for dysuria, flank pain, frequency, hematuria and urgency.       Sex life OK  Musculoskeletal: Negative for back pain, falls, joint pain, myalgias and neck pain.  Skin: Negative for itching and rash.  Neurological: Positive for tremors (mild). Negative for dizziness, tingling, sensory change, speech change, focal weakness, seizures, loss of consciousness, weakness and headaches.  Endo/Heme/Allergies: Positive for environmental allergies (Seasonal allergies). Negative for polydipsia. Does not bruise/bleed easily.  Psychiatric/Behavioral: Positive for depression (mild), memory loss and substance abuse. Negative for hallucinations and suicidal ideas. The patient is nervous/anxious and has insomnia.     Blood pressure 112/76, pulse 86, height 5' 11.5" (1.816 m), weight 207 lb (93.9 kg).Body mass index is 28.47 kg/m.  General Appearance: Well Groomed and nervous  Eye Contact:  Good  Speech:  Clear and Coherent  Volume:  Normal  Mood:  Anxious  Affect:   Appropriate, Congruent and Full Range  Thought Process:  Coherent, Goal Directed and Descriptions of Associations: Intact  Orientation:  Full (Time, Place, and Person)  Thought Content:  WDL, Logical and worried about work/sleep pattern  Suicidal Thoughts:  No  Homicidal Thoughts:  No  Memory:  Blackouts when drinking  Judgement:  Impaired  Insight:  Shallow  Psychomotor Activity:  Normal  Concentration:  Concentration: Good and Attention Span: Good  Recall:  Good  Fund of Knowledge:Fair  Language: Good  Akathisia:  Negative  Handed:  Right  AIMS (if indicated):  NA  Assets:  Desire for Improvement Financial Resources/Insurance Housing Resilience Talents/Skills Transportation Vocational/Educational  ADL's:  Intact  Cognition: Impaired,  ModerateWithdrawing from ETOH  Sleep: ok with increase in Seroquel   Musculoskeletal: Strength & Muscle Tone: within normal limits Gait & Station: normal Patient leans: N/A  Labs Results for Ian, [name] (MRN [id number]) as of 02/04/2016 15:53  Ref. Range 12/26/2015 06:29 01/22/2016 03:54  BASIC METABOLIC PANEL Unknown Rpt (A)   COMPREHENSIVE METABOLIC PANEL Unknown  Rpt (A)  Sodium Latest Ref Range: 135 - 145 mmol/L 138 136  Potassium Latest Ref Range: 3.5 - 5.1 mmol/L 3.4 (L) 4.3  Chloride Latest Ref Range: 101 - 111 mmol/L 103 101  CO2 Latest Ref Range: 22 - 32 mmol/L 26 22  Glucose Latest Ref Range: 65 - 99 mg/dL 100 (H) 130 (H)  Mean Plasma Glucose Latest Units: mg/dL 108   BUN Latest Ref Range: 6 - 20 mg/dL 11 18  Creatinine Latest Ref Range:  0.61 - 1.24 mg/dL 0.75 0.75  Calcium Latest Ref Range: 8.9 - 10.3 mg/dL 9.0 8.6 (L)  Anion gap Latest Ref Range: 5 - '15  9 13  '$ Alkaline Phosphatase Latest Ref Range: 38 - 126 U/L  70  Albumin Latest Ref Range: 3.5 - 5.0 g/dL  4.7  AST Latest Ref Range: 15 - 41 U/L  118 (H)  ALT Latest Ref Range: 17 - 63 U/L  82 (H)  Total Protein Latest Ref Range: 6.5 - 8.1 g/dL  8.5 (H)  Total  Bilirubin Latest Ref Range: 0.3 - 1.2 mg/dL  1.0  EGFR (African American) Latest Ref Range: >60 mL/min >60 >60  EGFR (Non-African Amer.) Latest Ref Range: >60 mL/min >60 >60  Total CHOL/HDL Ratio Latest Units: RATIO 2.8   Cholesterol Latest Ref Range: 0 - 200 mg/dL 176   HDL Cholesterol Latest Ref Range: >40 mg/dL 62   LDL (calc) Latest Ref Range: 0 - 99 mg/dL 98   Triglycerides Latest Ref Range: <150 mg/dL 82   VLDL Latest Ref Range: 0 - 40 mg/dL 16   WBC Latest Ref Range: 4.0 - 10.5 K/uL 9.3 7.8  RBC Latest Ref Range: 4.22 - 5.81 MIL/uL 4.92 6.10 (H)  Hemoglobin Latest Ref Range: 13.0 - 17.0 g/dL 15.0 19.4 (H)  HCT Latest Ref Range: 39.0 - 52.0 % 43.3 52.4 (H)  MCV Latest Ref Range: 78.0 - 100.0 fL 88.0 85.9  MCH Latest Ref Range: 26.0 - 34.0 pg 30.5 31.8  MCHC Latest Ref Range: 30.0 - 36.0 g/dL 34.6 37.0 (H)  RDW Latest Ref Range: 11.5 - 15.5 % 14.0 13.6  Platelets Latest Ref Range: 150 - 400 K/uL 184 252  Prolactin Latest Ref Range: 4.0 - 15.2 ng/mL 16.2 (H)   Results for Ian, [name] (MRN [id number]) as of 02/04/2016 15:53  Ref. Range 12/23/2015 19:03 12/25/2015 18:40  Alcohol, Ethyl (B) Latest Ref Range: <5 mg/dL 380 (HH)   Amphetamines Latest Ref Range: NONE DETECTED   NONE DETECTED  Barbiturates Latest Ref Range: NONE DETECTED   NONE DETECTED  Benzodiazepines Latest Ref Range: NONE DETECTED   POSITIVE (A)  Opiates Latest Ref Range: NONE DETECTED   NONE DETECTED  COCAINE Latest Ref Range: NONE DETECTED   NONE DETECTED  Tetrahydrocannabinol Latest Ref Range: NONE DETECTED   NONE DETECTED     Treatment Plan/Recommendations:  Plan of Care:Alcohol dependence/Core issues [facility] OP CDIOP see counselors individualized program  Laboratory:  UDS per protocol/See results above  Psychotherapy: CD IOP Group;Individual and Family  Medications: See list  Routine PRN Medications:  No  Consultations: None at this time  Safety Concerns: Relapse   Other:  Defer Antabuse rx to current  treatments    Darlyne Russian, PA-C 01/31/2016 3:19 pm

## 2016-01-31 NOTE — Progress Notes (Signed)
CD-IOP INDIVIDUAL COUNSELING SESSION 1PM-1:50PM  Patient was active and engaged in session. He presented as anxious, agitated, and tearful throughout session. Patient shared about his week in which he missed his appointments for CD-IOP due to actively drinking alcohol. Patient reported he had not drank alcohol since Monday Jan. 15. He stated he had been staying at his parents house for the week to stay safe and try to attempt to come back to group. Patient and counselor discussed pt hx of returning to use when "things are going well". Pt stated firmly he does not like drinking and wants to quit but he cannot refuse the cravings. Counselor used MI to increase pt awareness of his ambivalence about gaining sobriety. Pt discussed his fear of not having a job upon returning to work. Counselor and pt created multiple strategies for contacting pt supervisor and discussing next steps for his return to work. Pt stated he felt better upon leaving session and he hoped to call his supervisor today.

## 2016-02-01 ENCOUNTER — Other Ambulatory Visit (HOSPITAL_COMMUNITY): Payer: BLUE CROSS/BLUE SHIELD

## 2016-02-02 ENCOUNTER — Other Ambulatory Visit (HOSPITAL_COMMUNITY): Payer: BLUE CROSS/BLUE SHIELD | Admitting: Psychology

## 2016-02-02 DIAGNOSIS — F411 Generalized anxiety disorder: Secondary | ICD-10-CM

## 2016-02-02 DIAGNOSIS — F1020 Alcohol dependence, uncomplicated: Secondary | ICD-10-CM | POA: Diagnosis not present

## 2016-02-02 DIAGNOSIS — F1994 Other psychoactive substance use, unspecified with psychoactive substance-induced mood disorder: Secondary | ICD-10-CM

## 2016-02-03 ENCOUNTER — Other Ambulatory Visit (HOSPITAL_COMMUNITY): Payer: BLUE CROSS/BLUE SHIELD | Admitting: Psychology

## 2016-02-03 DIAGNOSIS — F1020 Alcohol dependence, uncomplicated: Secondary | ICD-10-CM

## 2016-02-03 NOTE — Progress Notes (Signed)
    Daily Group Progress Note  Program: CD-IOP   02/03/2016 Ian [name] [id number]  Diagnosis:  Alcohol use disorder, severe, dependence (HCC)  GAD (generalized anxiety disorder)  Substance induced mood disorder ([place])  H/O acute alcoholic hepatitis  Alcohol-induced acute pancreatitis without infection or necrosis   Sobriety Date: 1/16  Group Time: 1-3:30pm  Participation Level: Active  Behavioral Response: Appropriate and Sharing  Type of Therapy: Process Group  Interventions: Supportive  Topic: Process: The first half of group was spent in process. Group members shared about the past week and the two snow days that kept this office closed. The group had not met since last Monday. There was a lot to report on and, clearly, the heavy snow poor roads upset the routines and plans for many. A new group member was present today along with another group member who had been out for two weeks in a relapse. Drug tests were collected from everyone present and the medical director met with at least 3 group members today.   Group Time: 3:30-4pm  Participation Level: Active  Behavioral Response: Appropriate  Type of Therapy: Activity Group  Interventions: Strength-based  Topic: Psycho-Ed: The psycho-ed was very brief due to the lengthy time spent in process. A five-minute relaxation meditation was played, and the group processed about their experience of the guided meditation. Members reported it had proven relaxing and they enjoyed the activity.   Summary: The patient returned today after having 'gone back out' for almost two weeks. He received a warm welcome from his fellow group members, many of whom had been in group prior to his disappearance. He shared about what had happened and how he felt today. The patient expressed his desire to get back on track and was challenged by the counselor about how he might do that. He reported he had gone to church yesterday and, " I  felt as if the sermon was for me". He is alternating between staying at his parents'  home and staying at his apartment. The patient admitted he had not picked up a 'starter chip', but he reported he had never really put any significance in a date. The patient met with the program director today and will start over in this program as of today. Any unexcused absences will result in discharge. The patient received helpful feedback from fellow group members and made some good comments.   UDS collected: Yes Results: pending  AA/NA attended?: AA, Saturday  Sponsor?: No   Brandon Melnick, LCAS 02/03/2016 5:47 PM

## 2016-02-04 ENCOUNTER — Other Ambulatory Visit (HOSPITAL_COMMUNITY): Payer: BLUE CROSS/BLUE SHIELD

## 2016-02-06 ENCOUNTER — Encounter (HOSPITAL_COMMUNITY): Payer: Self-pay | Admitting: Psychology

## 2016-02-06 NOTE — Progress Notes (Signed)
    Daily Group Progress Note  Program: CD-IOP   02/06/2016 Ian [name] [id number]  Diagnosis:  No diagnosis found.   Sobriety Date: 1/16  Group Time: 1-2:30pm  Participation Level: Active  Behavioral Response: Appropriate and Sharing  Type of Therapy: Process Group  Interventions: Supportive  Topic: Process: Counselors met with patients to discuss the activities they had engaged in to support their recovery. A new group member introduced herself to the group and shared her reasons for pursuing treatment. All members were engaged in the discussion concerning recovery from mind-altering drugs and alcohol.  Group Time: 2:30-4pm  Participation Level: Active  Behavioral Response: Appropriate  Type of Therapy: Psycho-education Group  Interventions: Solution Focused  Topic: Psychoeducation: Group leaders provided information around "affective" processes, and facilitated a discussion regarding each group member's subjective experiences of emotions; this was achieved through a process game, "Emotional Jenga." Particular emphasis was placed on how certain emotional experiences can be used as insight into client's needs, concerns, or growth related to their recovery. Drug tests were collected from several members.   Summary: The patient presented as moderately active and engaged in group. He shared about finding it difficult to balance recovery and work, such that he did not attend any meetings because of his work schedule. Additionally, patient shared feeling guilty that he has to leave work to attend IOP. The group and counselors were supportively challenging in this area through iterating the importance of focusing on today and recovery-based activities. Patient seemed to respond well to this, and shared "it's good to be back." Patient presented with strong positive affect when sharing about talking to his son. He was participatory during the psychoeducation activity, though  seems to become more active after called upon by group leaders. He responded well to this intervention.    UDS collected: Yes Results: pending  AA/NA attended?: No  Sponsor?: No   Ian [name], LCAS 02/06/2016 10:01 AM

## 2016-02-07 ENCOUNTER — Other Ambulatory Visit (HOSPITAL_COMMUNITY): Payer: BLUE CROSS/BLUE SHIELD

## 2016-02-08 ENCOUNTER — Other Ambulatory Visit (HOSPITAL_COMMUNITY): Payer: BLUE CROSS/BLUE SHIELD

## 2016-02-09 ENCOUNTER — Other Ambulatory Visit (HOSPITAL_COMMUNITY): Payer: BLUE CROSS/BLUE SHIELD

## 2016-02-10 ENCOUNTER — Telehealth (HOSPITAL_COMMUNITY): Payer: Self-pay | Admitting: Licensed Clinical Social Worker

## 2016-02-10 ENCOUNTER — Other Ambulatory Visit (HOSPITAL_COMMUNITY): Payer: BLUE CROSS/BLUE SHIELD

## 2016-02-10 NOTE — Telephone Encounter (Signed)
Counselor called to discuss pt discharge from CD-IOP due to his lack of attendance at last 2 meetings and lack of communication about his whereabouts and safety. Counselor left VM asking pt to call him directly to discuss further plans for tx.

## 2016-02-11 ENCOUNTER — Other Ambulatory Visit (HOSPITAL_COMMUNITY): Payer: BLUE CROSS/BLUE SHIELD

## 2016-02-14 ENCOUNTER — Other Ambulatory Visit (HOSPITAL_COMMUNITY): Payer: BLUE CROSS/BLUE SHIELD

## 2016-02-14 ENCOUNTER — Encounter (HOSPITAL_COMMUNITY): Payer: Self-pay | Admitting: Psychology

## 2016-02-14 NOTE — Progress Notes (Signed)
    Daily Group Progress Note  Program: CD-IOP   02/14/2016 Annabell Sabal [id number]  Diagnosis:  Alcohol use disorder, severe, dependence (HCC)  GAD (generalized anxiety disorder)  Substance induced mood disorder ([place])   Sobriety Date: 1/16  Group Time: 1-2:30  Participation Level: Active  Behavioral Response: Appropriate and Sharing  Type of Therapy: Process Group  Interventions: CBT and Solution Focused  Topic: Process: Patients shared about their recovery from mind-altering drugs. They stated challenges and successes in early recovery. One new member was present and shared that her sobriety date was today. Some patients met with program director Darlyne Russian (attached note). Most patients were very active and engaged during process session.     Group Time: 2:30-4  Participation Level: Active  Behavioral Response: Appropriate and Sharing  Type of Therapy: Psycho-education Group  Interventions: Other: Spirituality  Topic: Three [place] chaplains were present and led an experiential activity with a "listening rock" about listening w/o interruptions. Exercise was stated as being adapted for all beliefs, from [place] practices. Few members shared but all appeared engaged with exercise in a "working silence".   Summary: Patient was attentive and somewhat engaged during group. He presented as tired, red-faced and stated he was adjusting to returning to work, waking up at 5am, and not sleeping too well the night before. He reported that work was good and he did not have any complications or anxiety when he returned about a month of leave. He reported he is in good standing with his supervisor and is hopeful about his future employment. Counselor inquired about pt accountability in recovery since he has not gotten a new sponsor, has not picked up a new chip, and is not open with all about his relapse. Pt stated chips "do not mean much to me" and he is  "working on finding a sponsor". Patient became defensive when questioned. He remained silent for most of group after his check-in. He appeared fidgety and changed position in his seat frequently during psychoeducational listening activity, though he did not share.  UDS results were return for this pt and all results were negative.   UDS collected: No Results: negative  AA/NA attended?: YesTuesday  Sponsor?: No   Brandon Melnick, [place] 02/14/2016 8:28 AM

## 2016-02-15 ENCOUNTER — Other Ambulatory Visit (HOSPITAL_COMMUNITY): Payer: BLUE CROSS/BLUE SHIELD

## 2016-02-16 ENCOUNTER — Other Ambulatory Visit (HOSPITAL_COMMUNITY): Payer: BLUE CROSS/BLUE SHIELD

## 2016-02-17 ENCOUNTER — Other Ambulatory Visit (HOSPITAL_COMMUNITY): Payer: BLUE CROSS/BLUE SHIELD

## 2016-02-18 ENCOUNTER — Other Ambulatory Visit (HOSPITAL_COMMUNITY): Payer: BLUE CROSS/BLUE SHIELD

## 2016-02-21 ENCOUNTER — Other Ambulatory Visit (HOSPITAL_COMMUNITY): Payer: BLUE CROSS/BLUE SHIELD

## 2016-02-22 ENCOUNTER — Other Ambulatory Visit (HOSPITAL_COMMUNITY): Payer: BLUE CROSS/BLUE SHIELD

## 2016-02-23 ENCOUNTER — Other Ambulatory Visit (HOSPITAL_COMMUNITY): Payer: BLUE CROSS/BLUE SHIELD

## 2016-02-24 ENCOUNTER — Other Ambulatory Visit (HOSPITAL_COMMUNITY): Payer: BLUE CROSS/BLUE SHIELD

## 2016-02-25 ENCOUNTER — Other Ambulatory Visit (HOSPITAL_COMMUNITY): Payer: BLUE CROSS/BLUE SHIELD

## 2016-02-28 ENCOUNTER — Other Ambulatory Visit (HOSPITAL_COMMUNITY): Payer: BLUE CROSS/BLUE SHIELD

## 2016-02-29 ENCOUNTER — Other Ambulatory Visit (HOSPITAL_COMMUNITY): Payer: BLUE CROSS/BLUE SHIELD

## 2016-03-01 ENCOUNTER — Other Ambulatory Visit (HOSPITAL_COMMUNITY): Payer: BLUE CROSS/BLUE SHIELD

## 2016-03-02 ENCOUNTER — Other Ambulatory Visit (HOSPITAL_COMMUNITY): Payer: BLUE CROSS/BLUE SHIELD

## 2016-03-06 ENCOUNTER — Other Ambulatory Visit (HOSPITAL_COMMUNITY): Payer: BLUE CROSS/BLUE SHIELD

## 2016-03-08 ENCOUNTER — Other Ambulatory Visit (HOSPITAL_COMMUNITY): Payer: BLUE CROSS/BLUE SHIELD

## 2016-03-09 ENCOUNTER — Other Ambulatory Visit (HOSPITAL_COMMUNITY): Payer: BLUE CROSS/BLUE SHIELD

## 2016-03-11 ENCOUNTER — Encounter (HOSPITAL_COMMUNITY): Payer: Self-pay | Admitting: Emergency Medicine

## 2016-03-11 ENCOUNTER — Emergency Department (HOSPITAL_COMMUNITY)
Admission: EM | Admit: 2016-03-11 | Discharge: 2016-03-11 | Disposition: A | Discharge status: Home / Self Care | Payer: BLUE CROSS/BLUE SHIELD | Attending: Emergency Medicine | Admitting: Emergency Medicine

## 2016-03-11 DIAGNOSIS — F10230 Alcohol dependence with withdrawal, uncomplicated: Secondary | ICD-10-CM | POA: Diagnosis present

## 2016-03-11 DIAGNOSIS — Z87891 Personal history of nicotine dependence: Secondary | ICD-10-CM | POA: Diagnosis not present

## 2016-03-11 DIAGNOSIS — J45909 Unspecified asthma, uncomplicated: Secondary | ICD-10-CM | POA: Diagnosis not present

## 2016-03-11 DIAGNOSIS — Z79899 Other long term (current) drug therapy: Secondary | ICD-10-CM | POA: Insufficient documentation

## 2016-03-11 DIAGNOSIS — F10929 Alcohol use, unspecified with intoxication, unspecified: Secondary | ICD-10-CM

## 2016-03-11 DIAGNOSIS — I10 Essential (primary) hypertension: Secondary | ICD-10-CM | POA: Insufficient documentation

## 2016-03-11 DIAGNOSIS — F1010 Alcohol abuse, uncomplicated: Secondary | ICD-10-CM

## 2016-03-11 LAB — COMPREHENSIVE METABOLIC PANEL
ALBUMIN: 4.5 g/dL (ref 3.5–5.0)
ALT: 83 U/L — ABNORMAL HIGH (ref 17–63)
ANION GAP: 14 (ref 5–15)
AST: 181 U/L — ABNORMAL HIGH (ref 15–41)
Alkaline Phosphatase: 72 U/L (ref 38–126)
BUN: 14 mg/dL (ref 6–20)
CALCIUM: 8.5 mg/dL — AB (ref 8.9–10.3)
CHLORIDE: 102 mmol/L (ref 101–111)
CO2: 24 mmol/L (ref 22–32)
Creatinine, Ser: 0.78 mg/dL (ref 0.61–1.24)
GFR calc non Af Amer: >60 mL/min (ref >60)
GLUCOSE: 111 mg/dL — AB (ref 65–99)
POTASSIUM: 3.9 mmol/L (ref 3.5–5.1)
SODIUM: 140 mmol/L (ref 135–145)
Total Bilirubin: 1.1 mg/dL (ref 0.3–1.2)
Total Protein: 8.4 g/dL — ABNORMAL HIGH (ref 6.5–8.1)

## 2016-03-11 LAB — CBC
HEMATOCRIT: 47.1 % (ref 39.0–52.0)
Hemoglobin: 16.7 g/dL (ref 13.0–17.0)
MCH: 31.3 pg (ref 26.0–34.0)
MCHC: 35.5 g/dL (ref 30.0–36.0)
MCV: 88.2 fL (ref 78.0–100.0)
PLATELETS: 123 10*3/uL — AB (ref 150–400)
RBC: 5.34 MIL/uL (ref 4.22–5.81)
RDW: 13.7 % (ref 11.5–15.5)
WBC: 4.4 10*3/uL (ref 4.0–10.5)

## 2016-03-11 LAB — RAPID URINE DRUG SCREEN, HOSP PERFORMED
AMPHETAMINES: NOT DETECTED
BENZODIAZEPINES: NOT DETECTED
Barbiturates: NOT DETECTED
Cocaine: NOT DETECTED
Opiates: NOT DETECTED
TETRAHYDROCANNABINOL: NOT DETECTED

## 2016-03-11 LAB — ETHANOL: Alcohol, Ethyl (B): 406 mg/dL (ref <5)

## 2016-03-11 MED ORDER — LORAZEPAM 1 MG PO TABS
0.0000 mg | ORAL_TABLET | Freq: Four times a day (QID) | ORAL | Status: DC
  Administered 2016-03-11: 1 mg via ORAL
  Administered 2016-03-11: 2 mg via ORAL
  Filled 2016-03-11: qty 1
  Filled 2016-03-11: qty 2

## 2016-03-11 MED ORDER — HYDROXYZINE HCL 25 MG PO TABS: 50.0000 mg | ORAL_TABLET | Freq: Four times a day (QID) | ORAL | Status: DC | PRN

## 2016-03-11 MED ORDER — PROPRANOLOL HCL 10 MG PO TABS
10.0000 mg | ORAL_TABLET | Freq: Two times a day (BID) | ORAL | Status: DC
  Administered 2016-03-11 (×2): 10 mg via ORAL
  Filled 2016-03-11 (×4): qty 1

## 2016-03-11 MED ORDER — CHLORDIAZEPOXIDE HCL 25 MG PO CAPS: ORAL_CAPSULE | ORAL | 0 refills | Status: AC

## 2016-03-11 MED ORDER — ACAMPROSATE CALCIUM 333 MG PO TBEC: 666.0000 mg | DELAYED_RELEASE_TABLET | Freq: Four times a day (QID) | ORAL | Status: DC

## 2016-03-11 MED ORDER — GABAPENTIN 300 MG PO CAPS
300.0000 mg | ORAL_CAPSULE | Freq: Three times a day (TID) | ORAL | Status: DC
  Administered 2016-03-11 (×2): 300 mg via ORAL
  Filled 2016-03-11 (×2): qty 1

## 2016-03-11 MED ORDER — QUETIAPINE FUMARATE 50 MG PO TABS
50.0000 mg | ORAL_TABLET | Freq: Two times a day (BID) | ORAL | Status: DC
  Administered 2016-03-11 (×2): 50 mg via ORAL
  Filled 2016-03-11 (×2): qty 1

## 2016-03-11 MED ORDER — LORAZEPAM 1 MG PO TABS: 0.0000 mg | ORAL_TABLET | Freq: Two times a day (BID) | ORAL | Status: DC

## 2016-03-11 MED ORDER — MOMETASONE FURO-FORMOTEROL FUM 100-5 MCG/ACT IN AERO
2.0000 | INHALATION_SPRAY | Freq: Two times a day (BID) | RESPIRATORY_TRACT | Status: DC
  Administered 2016-03-11 (×2): 2 via RESPIRATORY_TRACT
  Filled 2016-03-11 (×2): qty 8.8

## 2016-03-11 NOTE — ED Notes (Signed)
Bed: WA27 Expected date:  Expected time:  Means of arrival:  Comments: Hold for triage 4 

## 2016-03-11 NOTE — ED Notes (Signed)
Bed: [facility]ALC Expected date:  Expected time:  Means of arrival:  Comments: Hold for TCU 26 

## 2016-03-11 NOTE — ED Notes (Signed)
Bed: [facility]PT4 Expected date:  Expected time:  Means of arrival:  Comments: 

## 2016-03-11 NOTE — ED Notes (Signed)
Bed: [facility]ALB Expected date:  Expected time:  Means of arrival:  Comments: 

## 2016-03-11 NOTE — ED Provider Notes (Signed)
[facility]-EMERGENCY DEPT Provider Note   CSN: [id number] Arrival date & time: 03/11/16  1256     History   Chief Complaint Chief Complaint  Patient presents with  . ETOH detox/hallucinations    HPI Ian [name] is a 36 y.o. male.  36 year old male with history of alcohol abuse presents requesting detox from alcohol. Denies any suicidal or homicidal ideations. No abdominal discomfort. No recent vomiting. Is unsure if he is having tactile hallucinations. Denies any auditory or visual ones. Denies responding to internal stimuli. Has been in rehabilitation before in the past. No treatment use prior to arrival      Past Medical History:  Diagnosis Date  . Alcohol dependence (HCC)   . Anxiety   . Asthma   . Depression   . Hypertension   . Pancreatitis     Patient Active Problem List   Diagnosis Date Noted  . 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 Cellulitis and abscess 02/15/2015  . Peritoneal cyst 02/15/2015  . 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 Alcoholic pancreatitis 02/08/2015  . Pancreatitis, alcoholic, acute 02/08/2015  . H/O acute alcoholic hepatitis 02/08/2015  . 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 Alcohol dependence, daily use (HCC) 02/03/2015  . Alcohol intoxication in alcoholism with blood level over 0.3 with complication (HCC) 02/03/2015  . Hx of pancreatitis 01/19/2015  . Prolonged Q-T interval on ECG 01/19/2015  . History of gastrointestinal disease 01/19/2015  . Alcohol use disorder, severe, dependence (HCC) 08/23/2014  . Generalized anxiety disorder 08/14/2013  . Moderate episode of recurrent major depressive disorder (HCC) 08/14/2013  . ASTHMA 07/31/2008    Past Surgical History:  Procedure Laterality Date  . NO PAST SURGERIES         Home Medications    Prior to Admission medications   Medication Sig Start Date End Date  Taking? Authorizing Provider  acamprosate (CAMPRAL) 333 MG tablet Take 2 tablets (666 mg total) by mouth 4 (four) times daily. 01/31/16 03/11/16 Yes Court Joy, PA-C  albuterol [facility] HFA) 108 (90 Base) MCG/ACT inhaler Inhale 1-2 puffs into the lungs every 4 (four) hours as needed for wheezing. 12/28/15  Yes Sanjuana Kava, NP  gabapentin (NEURONTIN) 300 MG capsule Take 1 capsule (300 mg total) by mouth 3 (three) times daily. For agitation/substance withdrawal syndrome 01/31/16  Yes Court Joy, PA-C  hydrOXYzine (ATARAX/VISTARIL) 50 MG tablet Take 1 tablet (50 mg total) by mouth 4 (four) times daily as needed for anxiety. 01/31/16  Yes Court Joy, PA-C  mometasone-formoterol (DULERA) 100-5 MCG/ACT AERO Inhale 2 puffs into the lungs 2 (two) times daily. For asthma 12/28/15  Yes Sanjuana Kava, NP  montelukast (SINGULAIR) 10 MG tablet  12/22/15  Yes Historical Provider, MD  naltrexone (DEPADE) 50 MG tablet Take 1 tablet (50 mg total) by mouth daily. For alcoholism 01/31/16  Yes Court Joy, PA-C  propranolol (INDERAL) 20 MG tablet Take 1/2 tablet BID PRN and 1 tablet 1/2 hour before bedtime medications: For Palpitations/anxiety 01/31/16  Yes Court Joy, PA-C  QUEtiapine (SEROQUEL) 50 MG tablet Take 1 tablet (50 mg total) by mouth 2 (two) times daily. 01/31/16 01/30/17 Yes Court Joy, PA-C    Family History Family History  Problem Relation Age of Onset  . OCD Mother   . Heart disease Mother   . Suicidality Neg Hx     Social History Social History  Substance Use  Topics  . Smoking status: Former Smoker    Types: Cigarettes    Quit date: 01/31/1999  . Smokeless tobacco: Never Used  . Alcohol use 36.0 oz/week    60 Shots of liquor per week     Comment: varies how much     Allergies   Patient has no known allergies.   Review of Systems Review of Systems  All other systems reviewed and are negative.    Physical Exam Updated Vital Signs BP (!) 137/111   Pulse 105    Temp 98 F (36.7 C)   Resp 16   SpO2 95%   Physical Exam  Constitutional: He is oriented to person, place, and time. He appears well-developed and well-nourished.  Non-toxic appearance. No distress.  HENT:  Head: Normocephalic and atraumatic.  Eyes: Conjunctivae, EOM and lids are normal. Pupils are equal, round, and reactive to light.  Neck: Normal range of motion. Neck supple. No tracheal deviation present. No thyroid mass present.  Cardiovascular: Normal rate, regular rhythm and normal heart sounds.  Exam reveals no gallop.   No murmur heard. Pulmonary/Chest: Effort normal and breath sounds normal. No stridor. No respiratory distress. He has no decreased breath sounds. He has no wheezes. He has no rhonchi. He has no rales.  Abdominal: Soft. Normal appearance and bowel sounds are normal. He exhibits no distension. There is no tenderness. There is no rebound and no CVA tenderness.  Musculoskeletal: Normal range of motion. He exhibits no edema or tenderness.  Neurological: He is alert and oriented to person, place, and time. He has normal strength. No cranial nerve deficit or sensory deficit. GCS eye subscore is 4. GCS verbal subscore is 5. GCS motor subscore is 6.  Skin: Skin is warm and dry. No abrasion and no rash noted.  Psychiatric: His affect is blunt. His speech is delayed. He is withdrawn. He is not actively hallucinating. He expresses no suicidal plans and no homicidal plans.  Nursing note and vitals reviewed.    ED Treatments / Results  Labs (all labs ordered are listed, but only abnormal results are displayed) Labs Reviewed  COMPREHENSIVE METABOLIC PANEL - Abnormal; Notable for the following:       Result Value   Glucose, Bld 111 (*)    Calcium 8.5 (*)    Total Protein 8.4 (*)    AST 181 (*)    ALT 83 (*)    All other components within normal limits  ETHANOL - Abnormal; Notable for the following:    Alcohol, Ethyl (B) 406 (*)    All other components within normal  limits  CBC - Abnormal; Notable for the following:    Platelets 123 (*)    All other components within normal limits  RAPID URINE DRUG SCREEN, HOSP PERFORMED    EKG  EKG Interpretation  Date/Time:  Saturday March 11 2016 13:24:38 EST Ventricular Rate:  113 PR Interval:  172 QRS Duration: 104 QT Interval:  336 QTC Calculation: 460 R Axis:   111 Text Interpretation:  Sinus tachycardia Right axis deviation Nonspecific ST abnormality Abnormal ECG Confirmed by Nhi Butrum  MD, Marelyn Rouser ([id number]) on 03/11/2016 2:38:55 PM       Radiology No results found.  Procedures Procedures (including critical care time)  Medications Ordered in ED Medications  LORazepam (ATIVAN) tablet 0-4 mg (not administered)    Followed by  LORazepam (ATIVAN) tablet 0-4 mg (not administered)  mometasone-formoterol (DULERA) 100-5 MCG/ACT inhaler 2 puff (not administered)  hydrOXYzine (ATARAX/VISTARIL) tablet 50 mg (  not administered)  gabapentin (NEURONTIN) capsule 300 mg (not administered)  propranolol (INDERAL) tablet 10 mg (not administered)  QUEtiapine (SEROQUEL) tablet 50 mg (not administered)     Initial Impression / Assessment and Plan / ED Course  I have reviewed the triage vital signs and the nursing notes.  Pertinent labs & imaging results that were available during my care of the patient were reviewed by me and considered in my medical decision making (see chart for details).     Patient will be allowed to sober. Placed on alcohol withdrawal protocol. No acute psychiatric emergency at this time. Will discharge with resources as well as prescription for Librium  Final Clinical Impressions(s) / ED Diagnoses   Final diagnoses:  None    New Prescriptions New Prescriptions   No medications on file     Lorre Nick, MD 03/11/16 1449

## 2016-03-11 NOTE — Discharge Instructions (Signed)
It was our pleasure to provide your ER care today - we hope that you feel better.  Do not drink alcohol.  Never drive if/when drinking  alcohol.  Take librium as prescribed, as need for withdrawal symptoms - no driving when taking.  Follow up with AA, and use resource guide provided for additional alcohol treatment resources.   For [facility] health issues and/or crisis[facility]  For medical care, follow up with primary care doctor in the next 1-2 weeks.  Return to ER if worse, new symptoms, fevers, persistent vomiting, other medical emergency.

## 2016-03-11 NOTE — ED Notes (Signed)
Called mom to get patient a ride.

## 2016-03-11 NOTE — ED Provider Notes (Signed)
Patient ambulates in ED, steady gait.   Has eaten and drank.   States feels improved. No tremor or shakes.  Normal mood and affect.  Clinically sober and stable for d/c.  rec AA, and will provide resource guide.      Cathren LaineKevin Sophee Mckimmy, MD 03/11/16 [phone]

## 2016-03-11 NOTE — ED Triage Notes (Signed)
Per pt, states he has not taken his psych meds in over a month-has been drinking daily for over a week-states he "cant take drinking and going thru detox"-states he cant stop shaking

## 2016-03-13 ENCOUNTER — Other Ambulatory Visit (HOSPITAL_COMMUNITY): Payer: BLUE CROSS/BLUE SHIELD

## 2016-03-15 ENCOUNTER — Other Ambulatory Visit (HOSPITAL_COMMUNITY): Payer: BLUE CROSS/BLUE SHIELD

## 2016-03-16 ENCOUNTER — Other Ambulatory Visit (HOSPITAL_COMMUNITY): Payer: BLUE CROSS/BLUE SHIELD

## 2016-03-20 ENCOUNTER — Other Ambulatory Visit (HOSPITAL_COMMUNITY): Payer: BLUE CROSS/BLUE SHIELD

## 2016-03-22 ENCOUNTER — Other Ambulatory Visit (HOSPITAL_COMMUNITY): Payer: BLUE CROSS/BLUE SHIELD

## 2016-03-23 ENCOUNTER — Other Ambulatory Visit (HOSPITAL_COMMUNITY): Payer: BLUE CROSS/BLUE SHIELD

## 2016-03-27 ENCOUNTER — Other Ambulatory Visit (HOSPITAL_COMMUNITY): Payer: BLUE CROSS/BLUE SHIELD

## 2016-03-29 ENCOUNTER — Other Ambulatory Visit (HOSPITAL_COMMUNITY): Payer: BLUE CROSS/BLUE SHIELD

## 2016-03-29 ENCOUNTER — Other Ambulatory Visit (HOSPITAL_COMMUNITY): Payer: Self-pay | Admitting: Medical

## 2016-03-30 ENCOUNTER — Other Ambulatory Visit (HOSPITAL_COMMUNITY): Payer: BLUE CROSS/BLUE SHIELD

## 2016-04-03 ENCOUNTER — Other Ambulatory Visit (HOSPITAL_COMMUNITY): Payer: BLUE CROSS/BLUE SHIELD

## 2016-04-05 ENCOUNTER — Other Ambulatory Visit (HOSPITAL_COMMUNITY): Payer: BLUE CROSS/BLUE SHIELD

## 2016-04-06 ENCOUNTER — Other Ambulatory Visit (HOSPITAL_COMMUNITY): Payer: BLUE CROSS/BLUE SHIELD

## 2016-04-10 ENCOUNTER — Other Ambulatory Visit (HOSPITAL_COMMUNITY): Payer: BLUE CROSS/BLUE SHIELD

## 2016-04-12 ENCOUNTER — Other Ambulatory Visit (HOSPITAL_COMMUNITY): Payer: BLUE CROSS/BLUE SHIELD

## 2016-04-13 ENCOUNTER — Other Ambulatory Visit (HOSPITAL_COMMUNITY): Payer: BLUE CROSS/BLUE SHIELD

## 2016-04-17 ENCOUNTER — Other Ambulatory Visit (HOSPITAL_COMMUNITY): Payer: BLUE CROSS/BLUE SHIELD

## 2016-04-19 ENCOUNTER — Other Ambulatory Visit (HOSPITAL_COMMUNITY): Payer: BLUE CROSS/BLUE SHIELD

## 2016-04-20 ENCOUNTER — Other Ambulatory Visit (HOSPITAL_COMMUNITY): Payer: BLUE CROSS/BLUE SHIELD

## 2016-04-24 ENCOUNTER — Other Ambulatory Visit (HOSPITAL_COMMUNITY): Payer: BLUE CROSS/BLUE SHIELD

## 2016-04-26 ENCOUNTER — Other Ambulatory Visit (HOSPITAL_COMMUNITY): Payer: BLUE CROSS/BLUE SHIELD

## 2016-04-27 ENCOUNTER — Other Ambulatory Visit (HOSPITAL_COMMUNITY): Payer: BLUE CROSS/BLUE SHIELD

## 2016-05-01 ENCOUNTER — Other Ambulatory Visit (HOSPITAL_COMMUNITY): Payer: BLUE CROSS/BLUE SHIELD

## 2016-05-03 ENCOUNTER — Other Ambulatory Visit (HOSPITAL_COMMUNITY): Payer: BLUE CROSS/BLUE SHIELD

## 2016-05-04 ENCOUNTER — Other Ambulatory Visit (HOSPITAL_COMMUNITY): Payer: BLUE CROSS/BLUE SHIELD

## 2016-05-08 ENCOUNTER — Other Ambulatory Visit (HOSPITAL_COMMUNITY): Payer: BLUE CROSS/BLUE SHIELD

## 2017-02-17 ENCOUNTER — Emergency Department
Admission: EM | Admit: 2017-02-17 | Discharge: 2017-02-17 | Disposition: A | Discharge status: Other Acute Inpatient Hospital | Payer: BC Managed Care – PPO | Attending: Emergency Medicine | Admitting: Emergency Medicine

## 2017-02-17 ENCOUNTER — Other Ambulatory Visit: Payer: Self-pay

## 2017-02-17 ENCOUNTER — Encounter (HOSPITAL_COMMUNITY): Payer: Self-pay

## 2017-02-17 DIAGNOSIS — F329 Major depressive disorder, single episode, unspecified: Secondary | ICD-10-CM | POA: Insufficient documentation

## 2017-02-17 DIAGNOSIS — Z79899 Other long term (current) drug therapy: Secondary | ICD-10-CM | POA: Insufficient documentation

## 2017-02-17 DIAGNOSIS — Y908 Blood alcohol level of 240 mg/100 ml or more: Secondary | ICD-10-CM | POA: Insufficient documentation

## 2017-02-17 DIAGNOSIS — F419 Anxiety disorder, unspecified: Secondary | ICD-10-CM | POA: Insufficient documentation

## 2017-02-17 DIAGNOSIS — Z008 Encounter for other general examination: Secondary | ICD-10-CM

## 2017-02-17 DIAGNOSIS — F17200 Nicotine dependence, unspecified, uncomplicated: Secondary | ICD-10-CM | POA: Insufficient documentation

## 2017-02-17 DIAGNOSIS — F10129 Alcohol abuse with intoxication, unspecified: Secondary | ICD-10-CM | POA: Insufficient documentation

## 2017-02-17 HISTORY — DX: Anxiety disorder, unspecified: F41.9

## 2017-02-17 HISTORY — DX: Depression, unspecified: F32.A

## 2017-02-17 HISTORY — DX: Alcohol dependence, uncomplicated (CMS HCC): F10.20

## 2017-02-17 LAB — URINALYSIS, MICROSCOPIC

## 2017-02-17 LAB — COMPREHENSIVE METABOLIC PANEL, NON-FASTING
ALBUMIN: 5 g/dL — ABNORMAL HIGH (ref 3.6–4.8)
ALKALINE PHOSPHATASE: 98 U/L (ref 38–126)
ALT (SGPT): 69 U/L — ABNORMAL HIGH (ref 3–45)
ANION GAP: 15 mmol/L
AST (SGOT): 44 U/L (ref 7–56)
AST (SGOT): 44 U/L (ref 7–56)
BILIRUBIN TOTAL: 0.5 mg/dL (ref 0.2–1.3)
BUN/CREA RATIO: 18
BUN: 14 mg/dL (ref 9–21)
CALCIUM: 9.6 mg/dL (ref 8.5–10.3)
CHLORIDE: 107 mmol/L (ref 101–111)
CO2 TOTAL: 28 mmol/L (ref 22–31)
CREATININE: 0.8 mg/dL (ref 0.70–1.40)
ESTIMATED GFR: 110 mL/min/1.73mˆ2 (ref >=60)
GLUCOSE: 100 mg/dL — ABNORMAL HIGH (ref 68–99)
POTASSIUM: 4.5 mmol/L (ref 3.6–5.0)
PROTEIN TOTAL: 8.6 g/dL — ABNORMAL HIGH (ref 6.2–8.0)
SODIUM: 150 mmol/L — ABNORMAL HIGH (ref 137–145)

## 2017-02-17 LAB — URINALYSIS, MACROSCOPIC
BILIRUBIN: NOT DETECTED mg/dL
GLUCOSE: NOT DETECTED mg/dL
KETONES: NOT DETECTED mg/dL
LEUKOCYTES: NOT DETECTED WBCs/uL
NITRITE: NOT DETECTED
PH: 5 — ABNORMAL LOW (ref 5.0–8.0)
PROTEIN: NOT DETECTED mg/dL
SPECIFIC GRAVITY: 1.015 (ref 1.005–1.030)
UROBILINOGEN: NOT DETECTED mg/dL

## 2017-02-17 LAB — URINE DRUG SCREEN
AMPHETAMINES URINE: NOT DETECTED
BARBITURATES URINE: NOT DETECTED
BENZODIAZEPINES URINE: NOT DETECTED
BUPRENORPHINE URINE: NOT DETECTED
CANNABINOIDS URINE: NOT DETECTED
COCAINE METABOLITES URINE: NOT DETECTED
ECSTASY/MDMA URINE: NOT DETECTED
METHADONE URINE: NOT DETECTED
METHAMPHETAMINES URINE: NOT DETECTED
OPIATES URINE: NOT DETECTED
OXYCODONE URINE: NOT DETECTED
PCP URINE: NOT DETECTED
PROPOXYPHENE URINE: NOT DETECTED

## 2017-02-17 LAB — CBC
HCT: 48 % (ref 42.0–52.0)
HGB: 16.4 g/dL (ref 14.0–18.0)
MCH: 30.3 pg (ref 27.0–31.0)
MCHC: 34.2 g/dL (ref 33.0–37.0)
MCV: 88.9 fL (ref 80.0–94.0)
PLATELETS: 212 x10ˆ3/uL (ref 130–400)
RBC: 5.41 x10ˆ6/uL (ref 4.70–6.10)
RDW: 13.8 % (ref 11.5–14.5)
WBC: 7.9 x10ˆ3/uL (ref 4.8–10.8)

## 2017-02-17 LAB — SALICYLATE ACID LEVEL: SALICYLATE LEVEL: <1 mg/dL — ABNORMAL LOW (ref 15–30)

## 2017-02-17 LAB — ETHANOL, SERUM: ETHANOL: 350 mg/dL — ABNORMAL HIGH (ref <10)

## 2017-02-17 LAB — ACETAMINOPHEN LEVEL: ACETAMINOPHEN LEVEL: <10 ug/mL — ABNORMAL LOW (ref 10–20)

## 2017-02-17 LAB — THYROID STIMULATING HORMONE (SENSITIVE TSH): TSH: 0.208 u[IU]/mL — ABNORMAL LOW (ref 0.465–4.680)

## 2017-02-17 NOTE — ED Provider Notes (Signed)
Emergency Department  Provider Note  HPI - 02/17/2017    Name: Brandon GutterJonathan Manganaro  Age and Gender: 37 y.o. male  Attending: Dr. Newman Pieshomas Byrne  APP: Glendon Axeheo Resa Rinks, PA-C    Chief Complaint   Patient presents with   . Med G Evaluation       HPI:  Brandon Bruce is a 37 y.o. male  who presents to the Emergency Department today for medical clearance. The patient was picked up to be [facility] to [facility] today and is currently intoxicated. He was brought here to be medically cleared for [facility]. He denies having any complaints at this time. He has medical history of alcoholism, anxiety, and depression. No pertinent surgical history. No known allergies.     PCP: No Pcp    Location: Psych  Quality: Intoxicated  Timing: Still present  Context: See HPI  Associated symptoms: Positive for intoxication.    History provided by: Patient     Review of Systems:    Constitutional: No fever, chills, or weakness.  Skin: No rashes or lesions. No diaphoresis.  HENT: No head injury. No sore throat, ear pain, or difficulty swallowing.  Eyes: No vision changes, redness, or discharge.  Cardio: No chest pain or palpitations.  Respiratory: No cough, wheezing, or SOB.  GI: No nausea/vomiting. No diarrhea or constipation. No abdominal pain.   GU: No dysuria, hematuria, or polyuria.  MSK: No joint pain. No neck or back pain.  Neuro: No loss of sensation, focal deficits, or LOC. No headache.  Psych: +intoxication No SI/HI.  All other systems reviewed and are negative, unless commented on in the HPI.       Below information reviewed with patient:   Current Outpatient Medications   Medication Sig   . HYDROXYZINE HCL ORAL Take by mouth   . naltrexone HCl (NALTREXONE ORAL) Take by mouth   . propranolol HCl (INDERAL ORAL) Take by mouth   . quetiapine fumarate (SEROQUEL ORAL) Take by mouth       Allergies no known allergies       Past Medical History:  Past Medical History:   Diagnosis Date   . Alcoholism (CMS HCC)    . Anxiety       . Depression        Past Surgical History:  History reviewed. No pertinent surgical history.    Social History:  Social History     Tobacco Use   . Smoking status: Current Some Day Smoker   . Smokeless tobacco: Never Used   Substance Use Topics   . Alcohol use: Yes     Comment: 1.5 liters   . Drug use: Never     Social History     Substance and Sexual Activity   Drug Use Never       Family History:  No family history on file.      Old records reviewed.      Objective:  Nursing notes reviewed    Filed Vitals:    02/17/17 1830 02/17/17 2114   BP: (!) 136/103 127/88   Pulse: 99 87   Resp: 16 16   Temp: 36.7 C (98.1 F) 36.7 C (98 F)   SpO2: 95% 98%       Physical Exam  Nursing note and vitals reviewed. Vital signs reviewed as above.     Constitutional: Pt is well-developed and well-nourished.   Head: Normocephalic and atraumatic.   ENT: TM's are normal. No erythema or bulging. Airway patent.  No trismus. No pharyngeal erythema.   Eyes: Conjunctivae are normal. Pupils are equal, round, and reactive to light. EOM are intact  Neck: Soft, supple, full range of motion.  Cardiovascular: RRR. No Murmurs/rubs/gallops. Distal pulses present and equal bilaterally.  Pulmonary/Chest: Normal BS BL with no distress. No audible wheezes or crackles are noted.  GI: Soft, nontender, nondistended. No rebound, guarding, or masses.  Musculoskeletal: Normal range of motion. No deformities.  Exhibits no edema and no tenderness.   Neurological: CNs 2-12 grossly intact.  No focal deficits noted.  Skin: Warm and dry. No rash or lesions.  Psychiatric: Patient has a normal mood and affect.     Work-up:  Orders Placed This Encounter   . CBC   . COMPREHENSIVE METABOLIC PANEL, NON-FASTING   . ETHANOL, SERUM   . URINE DRUG SCREEN   . URINALYSIS, MACROSCOPIC   . ACETAMINOPHEN LEVEL   . SALICYLATE ACID LEVEL   . THYROID STIMULATING HORMONE (SENSITIVE TSH)   . URINALYSIS, MICROSCOPIC        Labs:  Results for orders placed or performed during the  hospital encounter of 02/17/17 (from the past 24 hour(s))   CBC   Result Value Ref Range    WBC 7.9 4.8 - 10.8 x10^3/uL    RBC 5.41 4.70 - 6.10 x10^6/uL    HGB 16.4 14.0 - 18.0 g/dL    HCT 16.1 09.6 - 04.5 %    MCV 88.9 80.0 - 94.0 fL    MCH 30.3 27.0 - 31.0 pg    MCHC 34.2 33.0 - 37.0 g/dL    RDW 40.9 81.1 - 91.4 %    PLATELETS 212 130 - 400 x10^3/uL   COMPREHENSIVE METABOLIC PANEL, NON-FASTING   Result Value Ref Range    SODIUM 150 (H) 137 - 145 mmol/L    POTASSIUM 4.5 3.6 - 5.0 mmol/L    CHLORIDE 107 101 - 111 mmol/L    CO2 TOTAL 28 22 - 31 mmol/L    ANION GAP 15 mmol/L    BUN 14 9 - 21 mg/dL    CREATININE 7.82 9.56 - 1.40 mg/dL    BUN/CREA RATIO 18     ESTIMATED GFR 110 >=60 mL/min/1.40m^2    ALBUMIN 5.0 (H) 3.6 - 4.8 g/dL    CALCIUM 9.6 8.5 - 21.3 mg/dL    GLUCOSE 086 (H) 68 - 99 mg/dL    ALKALINE PHOSPHATASE 98 38 - 126 U/L    ALT (SGPT) 69 (H) 3 - 45 U/L    AST (SGOT) 44 7 - 56 U/L    BILIRUBIN TOTAL 0.5 0.2 - 1.3 mg/dL    PROTEIN TOTAL 8.6 (H) 6.2 - 8.0 g/dL   ETHANOL, SERUM   Result Value Ref Range    ETHANOL 350 (H) <10 mg/dL   URINE DRUG SCREEN   Result Value Ref Range    AMPHETAMINES URINE Not Detected Not Detected    BARBITURATES URINE Not Detected Not Detected    BENZODIAZEPINES URINE Not Detected Not Detected    CANNABINOIDS URINE Not Detected Not Detected    COCAINE METABOLITES URINE Not Detected Not Detected    ECSTASY/MDMA URINE Not Detected Not Detected    METHADONE URINE Not Detected Not Detected    METHAMPHETAMINES URINE Not Detected Not Detected    OPIATES URINE Not Detected Not Detected    OXYCODONE URINE Not Detected Not Detected    PCP URINE Not Detected Not Detected    PROPOXYPHENE URINE Not Detected Not Detected  BUPRENORPHINE URINE Not Detected Not Detected    Narrative    Any results reported as "detected" on this urine drug screen are unconfirmed screening results and should be used only for medical (i.e., treatment) purposes only. Unconfirmed screening results must not be used for  non-medical purposes (e.g., employment or legal testing). All results reported as "detected" are sent to a reference laboratory for confirmation by GCMS.   _________________________________________  Reporting Limits (cut-off concentrations)  _________________________________________     Cocaine           300 ng/mL   Opiates           300 ng/mL   Methamphetamine   500 ng/mL   THC50 ng/mL   Amphetamine  1000 ng/mL  Phencyclidine25 ng/mL   Benzodiazepine 300 ng/mL   Barbiturates 300 ng/mL   Methadone300 ng/mL   Propoxyphene300 ng/mL   Oxycodone100 ng/mL   MDMA500 ng/mL  Buprenorphine     10 ng/mL   URINALYSIS, MACROSCOPIC   Result Value Ref Range    COLOR Yellow Colorless, Straw, Yellow    APPEARANCE Hazy (A) Clear    SPECIFIC GRAVITY 1.015 >1.005-<1.030    PH 5.0 (L) >5.0-<8.0    PROTEIN Not Detected Not Detected mg/dL    GLUCOSE Not Detected Not Detected mg/dL    KETONES Not Detected Not Detected mg/dL    UROBILINOGEN Not Detected Not Detected mg/dL    BILIRUBIN Not Detected Not Detected mg/dL    BLOOD 2+ (A) Not Detected mg/dL    NITRITE Not Detected Not Detected    LEUKOCYTES Not Detected Not Detected WBCs/uL   ACETAMINOPHEN LEVEL   Result Value Ref Range    ACETAMINOPHEN LEVEL <10 (L) 10 - 20 ug/mL   SALICYLATE ACID LEVEL   Result Value Ref Range    SALICYLATE LEVEL <1 (L) 15 - 30 mg/dL   THYROID STIMULATING HORMONE (SENSITIVE TSH)   Result Value Ref Range    TSH 0.208 (L) 0.465 - 4.680 uIU/mL   URINALYSIS, MICROSCOPIC   Result Value Ref Range    WBCS 0-5 None, 0-5 /hpf    RBCS 0-5 None, 0-5 /hpf    MUCOUS Mod (A) None /hpf       Abnormal Lab results:  Labs Reviewed   COMPREHENSIVE METABOLIC PANEL, NON-FASTING - Abnormal; Notable for the following components:       Result Value    SODIUM 150 (*)     ALBUMIN 5.0 (*)     GLUCOSE 100 (*)     ALT (SGPT) 69 (*)     PROTEIN TOTAL 8.6 (*)     All other components within normal limits      ETHANOL, SERUM - Abnormal; Notable for the following components:    ETHANOL 350 (*)     All other components within normal limits   URINALYSIS, MACROSCOPIC - Abnormal; Notable for the following components:    APPEARANCE Hazy (*)     PH 5.0 (*)     BLOOD 2+ (*)     All other components within normal limits   ACETAMINOPHEN LEVEL - Abnormal; Notable for the following components:    ACETAMINOPHEN LEVEL <10 (*)     All other components within normal limits   SALICYLATE ACID LEVEL - Abnormal; Notable for the following components:    SALICYLATE LEVEL <1 (*)     All other components within normal limits   THYROID STIMULATING HORMONE (SENSITIVE TSH) - Abnormal; Notable for the following components:    TSH  0.208 (*)     All other components within normal limits   URINALYSIS, MICROSCOPIC - Abnormal; Notable for the following components:    MUCOUS Mod (*)     All other components within normal limits   CBC - Normal   URINE DRUG SCREEN - Normal    Narrative:     Any results reported as "detected" on this urine drug screen are unconfirmed screening results and should be used only for medical (i.e., treatment) purposes only. Unconfirmed screening results must not be used for non-medical purposes (e.g., employment or legal testing). All results reported as "detected" are sent to a reference laboratory for confirmation by GCMS.   _________________________________________  Reporting Limits (cut-off concentrations)  _________________________________________     Cocaine           300 ng/mL   Opiates           300 ng/mL   Methamphetamine   500 ng/mL   THC50 ng/mL   Amphetamine  1000 ng/mL  Phencyclidine25 ng/mL   Benzodiazepine 300 ng/mL   Barbiturates 300 ng/mL   Methadone300 ng/mL   Propoxyphene300 ng/mL   Oxycodone100 ng/mL   MDMA500 ng/mL  Buprenorphine     10 ng/mL     Plan: Appropriate labs ordered. Medical Records reviewed.    MDM:    During the  patient's stay in the emergency department, the above listed labs were performed to assist with medical decision making and were reviewed by myself when available for review. Results were discussed with the patient.   Pt remained stable throughout the emergency department course.     Patient was given the opportunity to ask questions. All questions were answered and the patient is in agreement with plan of discharge.         Consults:    None    Impression:   Encounter Diagnosis   Name Primary?   . Medical clearance for psychiatric admission Yes     Disposition:  Transfered to Another Facility   It was advised that the patient return to the ED with any new, concerning or worsening symptoms.   The patient verbalized understanding of all instructions and had no further questions or concerns.    Discussed with patient all lab results, diagnosis, treatment, and need for follow up.     I am scribing for, and in the presence of, Glendon Axe, PA-C for services provided on 02/17/2017.  Larita Fife, SCRIBE     [place], [place]  02/17/2017, 19:16      I personally performed the services described in this documentation, as scribed  in my presence, and it is both accurate  and complete.  Rodell Perna, PA-C  The co-signing faculty was physically present in the emergency department and available for consultation and did not participate in the care of this patient.  Rodell Perna, PA-C  02/17/2017, 81:19

## 2017-02-17 NOTE — ED Nurses Note (Signed)
Assumed care for Discharge.  [facility]  [facility].  Ambulated out at this time.

## 2017-02-17 NOTE — ED Triage Notes (Signed)
Patient is here for medical clearance for [facility].

## 2017-09-22 ENCOUNTER — Emergency Department (HOSPITAL_COMMUNITY)
Admission: EM | Admit: 2017-09-22 | Discharge: 2017-09-22 | Disposition: A | Discharge status: Home / Self Care | Payer: Self-pay | Attending: Emergency Medicine | Admitting: Emergency Medicine

## 2017-09-22 ENCOUNTER — Other Ambulatory Visit: Payer: Self-pay

## 2017-09-22 ENCOUNTER — Encounter (HOSPITAL_COMMUNITY): Payer: Self-pay

## 2017-09-22 ENCOUNTER — Emergency Department (HOSPITAL_COMMUNITY): Payer: Self-pay

## 2017-09-22 DIAGNOSIS — F1010 Alcohol abuse, uncomplicated: Secondary | ICD-10-CM | POA: Insufficient documentation

## 2017-09-22 DIAGNOSIS — Z79899 Other long term (current) drug therapy: Secondary | ICD-10-CM | POA: Insufficient documentation

## 2017-09-22 DIAGNOSIS — F10920 Alcohol use, unspecified with intoxication, uncomplicated: Secondary | ICD-10-CM

## 2017-09-22 DIAGNOSIS — I10 Essential (primary) hypertension: Secondary | ICD-10-CM | POA: Insufficient documentation

## 2017-09-22 DIAGNOSIS — Z87891 Personal history of nicotine dependence: Secondary | ICD-10-CM | POA: Insufficient documentation

## 2017-09-22 LAB — CBC
HCT: 45.5 % (ref 39.0–52.0)
Hemoglobin: 16 g/dL (ref 13.0–17.0)
MCH: 33.7 pg (ref 26.0–34.0)
MCHC: 35.2 g/dL (ref 30.0–36.0)
MCV: 95.8 fL (ref 78.0–100.0)
PLATELETS: 438 10*3/uL — AB (ref 150–400)
RBC: 4.75 MIL/uL (ref 4.22–5.81)
RDW: 14 % (ref 11.5–15.5)
WBC: 4 10*3/uL (ref 4.0–10.5)

## 2017-09-22 LAB — COMPREHENSIVE METABOLIC PANEL
ALT: 244 U/L — ABNORMAL HIGH (ref 0–44)
AST: 282 U/L — AB (ref 15–41)
Albumin: 5.1 g/dL — ABNORMAL HIGH (ref 3.5–5.0)
Alkaline Phosphatase: 116 U/L (ref 38–126)
Anion gap: 20 — ABNORMAL HIGH (ref 5–15)
BUN: 9 mg/dL (ref 6–20)
CHLORIDE: 100 mmol/L (ref 98–111)
CO2: 24 mmol/L (ref 22–32)
CREATININE: 0.67 mg/dL (ref 0.61–1.24)
Calcium: 9 mg/dL (ref 8.9–10.3)
GFR calc non Af Amer: >60 mL/min (ref >60)
Glucose, Bld: 102 mg/dL — ABNORMAL HIGH (ref 70–99)
POTASSIUM: 4.1 mmol/L (ref 3.5–5.1)
SODIUM: 144 mmol/L (ref 135–145)
Total Bilirubin: 0.6 mg/dL (ref 0.3–1.2)
Total Protein: 8.5 g/dL — ABNORMAL HIGH (ref 6.5–8.1)

## 2017-09-22 LAB — ETHANOL: ALCOHOL ETHYL (B): 412 mg/dL — AB (ref <10)

## 2017-09-22 LAB — I-STAT TROPONIN, ED: Troponin i, poc: 0 ng/mL (ref 0.00–0.08)

## 2017-09-22 LAB — RAPID URINE DRUG SCREEN, HOSP PERFORMED
AMPHETAMINES: NOT DETECTED
BENZODIAZEPINES: NOT DETECTED
Barbiturates: NOT DETECTED
COCAINE: NOT DETECTED
Opiates: NOT DETECTED
Tetrahydrocannabinol: NOT DETECTED

## 2017-09-22 MED ORDER — CHLORDIAZEPOXIDE HCL 25 MG PO CAPS: ORAL_CAPSULE | ORAL | 0 refills | Status: AC

## 2017-09-22 NOTE — ED Provider Notes (Addendum)
[facility] Note   CSN: [id number] Arrival date & time: 09/22/17  1649     History   Chief Complaint Chief Complaint  Patient presents with  . Alcohol Problem  . Chest Pain    HPI BARTLEY Ian [name] is a 37 y.o. male.  Patient c/o mid chest pain for the past couple days, and states has started abusing etoh in past weeks. Last drank this AM, can't quantify amount. When stops drinking feels shaky - denies seizures or hx dts. Denies depression or thoughts of self harm. Chest pain occurs at rest, mild, constant, dull, non radiating, no relation to exertion. No associated sob, nv or diaphoresis. Pain is not pleuritic. No leg pain or swelling. No hx dvt or pe.   The history is provided by the patient.  Alcohol Problem  Associated symptoms include chest pain. Pertinent negatives include no abdominal pain, no headaches and no shortness of breath.  Chest Pain   Pertinent negatives include no abdominal pain, no back pain, no cough, no fever, no headaches, no shortness of breath and no vomiting.    Past Medical History:  Diagnosis Date  . Alcohol dependence (HCC)   . Anxiety   . Asthma   . Depression   . Hypertension   . Pancreatitis     Patient Active Problem List   Diagnosis Date Noted  . 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 Cellulitis and abscess 02/15/2015  . Peritoneal cyst 02/15/2015  . 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 Alcoholic pancreatitis 02/08/2015  . Pancreatitis, alcoholic, acute 02/08/2015  . H/O acute alcoholic hepatitis 02/08/2015  . 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 Alcohol dependence, daily use (HCC) 02/03/2015  . Alcohol intoxication in alcoholism with blood level over 0.3 with complication (HCC) 02/03/2015  . Hx of pancreatitis 01/19/2015  . Prolonged Q-T interval on ECG 01/19/2015  .  History of gastrointestinal disease 01/19/2015  . Alcohol use disorder, severe, dependence (HCC) 08/23/2014  . Generalized anxiety disorder 08/14/2013  . Moderate episode of recurrent major depressive disorder (HCC) 08/14/2013  . ASTHMA 07/31/2008    Past Surgical History:  Procedure Laterality Date  . NO PAST SURGERIES          Home Medications    Prior to Admission medications   Medication Sig Start Date End Date Taking? Authorizing Provider  acamprosate (CAMPRAL) 333 MG tablet Take 2 tablets (666 mg total) by mouth 4 (four) times daily. 01/31/16 03/11/16  Court Joy, PA-C  albuterol (PROAIR HFA) 108 [phone] Base) MCG/ACT inhaler Inhale 1-2 puffs into the lungs every 4 (four) hours as needed for wheezing. 12/28/15   Armandina Stammer I, NP  chlordiazePOXIDE (LIBRIUM) 25 MG capsule 25 mg PO TID x 2 days, then 25  PO BID X 1 day, then 25 mg PO once a day x 1 day 03/11/16   Cathren Laine, MD  gabapentin (NEURONTIN) 300 MG capsule Take 1 capsule (300 mg total) by mouth 3 (three) times daily. For agitation/substance withdrawal syndrome 01/31/16   Court Joy, PA-C  hydrOXYzine (ATARAX/VISTARIL) 50 MG tablet Take 1 tablet (50 mg total) by mouth 4 (four) times daily as needed for anxiety. 01/31/16   Court Joy, PA-C  mometasone-formoterol (DULERA) 100-5 MCG/ACT AERO Inhale 2 puffs into the lungs 2 (two) times daily. For asthma 12/28/15   Armandina Stammer I, NP  montelukast (SINGULAIR) 10 MG tablet  12/22/15  [provider]  naltrexone (DEPADE) 50 MG tablet Take 1 tablet (50 mg total) by mouth daily. For alcoholism 01/31/16   Court Joy, PA-C  propranolol (INDERAL) 20 MG tablet Take 1/2 tablet BID PRN and 1 tablet 1/2 hour before bedtime medications: For Palpitations/anxiety 01/31/16   Court Joy, PA-C  QUEtiapine (SEROQUEL) 50 MG tablet Take 1 tablet (50 mg total) by mouth 2 (two) times daily. 01/31/16 01/30/17  Court Joy, PA-C    Family History Family History    Problem Relation Age of Onset  . OCD Mother   . Heart disease Mother   . Suicidality Neg Hx     Social History Social History   Tobacco Use  . Smoking status: Former Smoker    Types: Cigarettes    Last attempt to quit: 01/31/1999    Years since quitting: 18.6  . Smokeless tobacco: Never Used  Substance Use Topics  . Alcohol use: Yes    Alcohol/week: 60.0 standard drinks    Types: 60 Shots of liquor per week    Comment: varies how much  . Drug use: No     Allergies   Patient has no known allergies.   Review of Systems Review of Systems  Constitutional: Negative for fever.  HENT: Negative for sore throat.   Eyes: Negative for redness.  Respiratory: Negative for cough and shortness of breath.   Cardiovascular: Positive for chest pain. Negative for leg swelling.  Gastrointestinal: Negative for abdominal pain and vomiting.  Genitourinary: Negative for flank pain.  Musculoskeletal: Negative for back pain and neck pain.  Skin: Negative for rash.  Neurological: Negative for headaches.  Hematological: Does not bruise/bleed easily.  Psychiatric/Behavioral: Negative for confusion.     Physical Exam Updated Vital Signs BP (!) 153/108   Pulse (!) 104   Temp 98.1 F (36.7 C) (Oral)   Resp 17   SpO2 97%   Physical Exam  Constitutional: He appears well-developed and well-nourished.  HENT:  Mouth/Throat: Oropharynx is clear and moist.  Eyes: Conjunctivae are normal.  Neck: Neck supple. No tracheal deviation present.  Cardiovascular: Normal rate, regular rhythm, normal heart sounds and intact distal pulses. Exam reveals no gallop and no friction rub.  No murmur heard. Pulmonary/Chest: Effort normal and breath sounds normal. No accessory muscle usage. No respiratory distress. He exhibits tenderness.  Abdominal: Soft. Bowel sounds are normal. He exhibits no distension. There is no tenderness.  Genitourinary:  Genitourinary Comments: No cva tenderness  Musculoskeletal:  He exhibits no edema.  Neurological: He is alert.  Speech fluent. Motor intact bil, sens grossly intact. Steady gait.   Skin: Skin is warm and dry. No rash noted.  Psychiatric: He has a normal mood and affect.  Nursing note and vitals reviewed.    ED Treatments / Results  Labs (all labs ordered are listed, but only abnormal results are displayed) Results for orders placed or performed during the hospital encounter of 09/22/17  CBC  Result Value Ref Range   WBC 4.0 4.0 - 10.5 K/uL   RBC 4.75 4.22 - 5.81 MIL/uL   Hemoglobin 16.0 13.0 - 17.0 g/dL   HCT 16.1 09.6 - 04.5 %   MCV 95.8 78.0 - 100.0 fL   MCH 33.7 26.0 - 34.0 pg   MCHC 35.2 30.0 - 36.0 g/dL   RDW 40.9 81.1 - 91.4 %   Platelets 438 (H) 150 - 400 K/uL  Comprehensive metabolic panel  Result Value Ref Range   Sodium 144 135 -  145 mmol/L   Potassium 4.1 3.5 - 5.1 mmol/L   Chloride 100 98 - 111 mmol/L   CO2 24 22 - 32 mmol/L   Glucose, Bld 102 (H) 70 - 99 mg/dL   BUN 9 6 - 20 mg/dL   Creatinine, Ser 2.950.67 0.61 - 1.24 mg/dL   Calcium 9.0 8.9 - 62.110.3 mg/dL   Total Protein 8.5 (H) 6.5 - 8.1 g/dL   Albumin 5.1 (H) 3.5 - 5.0 g/dL   AST 308282 (H) 15 - 41 U/L   ALT 244 (H) 0 - 44 U/L   Alkaline Phosphatase 116 38 - 126 U/L   Total Bilirubin 0.6 0.3 - 1.2 mg/dL   GFR calc non Af Amer >60 >60 mL/min   GFR calc Af Amer >60 >60 mL/min   Anion gap 20 (H) 5 - 15  Rapid urine drug screen (hospital performed)  Result Value Ref Range   Opiates NONE DETECTED NONE DETECTED   Cocaine NONE DETECTED NONE DETECTED   Benzodiazepines NONE DETECTED NONE DETECTED   Amphetamines NONE DETECTED NONE DETECTED   Tetrahydrocannabinol NONE DETECTED NONE DETECTED   Barbiturates NONE DETECTED NONE DETECTED  Ethanol  Result Value Ref Range   Alcohol, Ethyl (B) 412 (HH) <10 mg/dL  I-stat troponin, ED  Result Value Ref Range   Troponin i, poc 0.00 0.00 - 0.08 ng/mL   Comment 3           Dg Chest 2 View  Result Date: 09/22/2017 CLINICAL DATA:   Left chest pain EXAM: CHEST - 2 VIEW COMPARISON:  02/22/2013 FINDINGS: Lungs are clear.  No pleural effusion or pneumothorax. The heart is normal in size. Visualized osseous structures are within normal limits. IMPRESSION: Normal chest radiographs. Electronically Signed   By: Charline BillsSriyesh  Krishnan M.D.   On: 09/22/2017 18:26    EKG None  Radiology Dg Chest 2 View  Result Date: 09/22/2017 CLINICAL DATA:  Left chest pain EXAM: CHEST - 2 VIEW COMPARISON:  02/22/2013 FINDINGS: Lungs are clear.  No pleural effusion or pneumothorax. The heart is normal in size. Visualized osseous structures are within normal limits. IMPRESSION: Normal chest radiographs. Electronically Signed   By: Charline BillsSriyesh  Krishnan M.D.   On: 09/22/2017 18:26    Procedures Procedures (including critical care time)  Medications Ordered in ED Medications - No data to display   Initial Impression / Assessment and Plan / ED Course  I have reviewed the triage vital signs and the nursing notes.  Pertinent labs & imaging results that were available during my care of the patient were reviewed by me and considered in my medical decision making (see chart for details).  Labs sent.   Reviewed nursing notes and prior charts for additional history.   Labs reviewed - etoh very high.   cxr reviewed - no pna.  Recheck, tolerating po fluids. No distress.   Recheck, has eaten/drank. No shakiness or tremors. Ambulates w steady gait.  No chest pain or discomfort. Trop normal.   rec etoh rehab/AA as outpatient.   rn indicates pt family coming to pick up patient/stay w him.   Also rec close pcp f/u.    Final Clinical Impressions(s) / ED Diagnoses   Final diagnoses:  None    ED Discharge Orders    None        Cathren LaineSteinl, Etherine Mackow[place]ak, MD 09/22/17 2256

## 2017-09-22 NOTE — ED Triage Notes (Signed)
He c/o dependence on alcohol and Xanax and wishes to undergo treatment for cessation of same. He also c/o chest pain. He is in no distress. EKG performed at triage.

## 2017-09-22 NOTE — Discharge Instructions (Addendum)
It was our pleasure to provide your ER care today - we hope that you feel better.  Avoid any alcohol use - follow up with AA, and use resource guide provided for additional community resources. No driving for the next 8 hours, or any time when drinking alcohol.   Use librium as prescribed, as need if withdrawal symptoms - no driving when taking.   Follow up with primary care doctor in the coming week.  Return to ER if worse, new symptoms, trouble breathing, fevers, other concern.

## 2017-09-22 NOTE — ED Notes (Signed)
Date and time results received: 09/22/17 1823 (use smartphrase ".now" to insert current time)  Test: ETOH Critical Value: 412  Name of Provider Notified: Jari Favrescar and Denton LankSteinl  Orders Received? Or Actions Taken?: Actions Taken: Occupational hygienistotified RN and [place] [employer] [place] [employer]inl

## 2017-09-22 NOTE — ED Notes (Signed)
Pt states he has called his family to come get him post discharge.

## 2018-05-15 ENCOUNTER — Encounter (HOSPITAL_COMMUNITY): Payer: Self-pay | Admitting: Psychology

## 2018-05-15 NOTE — Progress Notes (Signed)
Orientation to CD-IOP: The patient is a 38 yo single, white, male seeking entry into the CD-IOP. He lives alone here in [place]. The patient was discharged from [facility] on December 18 after a 3-day alcohol detox. The patient graduated from this program in April of 2017 and had attained 9 months of sobriety before returning to use. The patient has a full-time position at [employer] and has moved up the ranks in less than six months. In looking back at his relapse, the patient admitted he became complacent, stopped going to AA meetings and was not speaking with his sponsor with any frequency. In late October, he drove to [place] by himself to watch his 74 yo son compete in a go-cart race. He stopped in [place] and purchased two half gallons of vodka. He managed to keep his use hidden for a while and continued to work, but earlier this month, the patient realized things were falling apart. He called out of work and contacted his Network engineer here at [facility] outpatient clinic. The patient reported that in October there was a lapse in his Campral medication that reduces alcohol cravings. He feels as if this was when things started going awry. The patient had recently had all restrictions on driving with his son lifted and things were going better than they had for years. He denied any thoughts of self-sabotage. The patient splits custody of his son with the son's mother and when father and son are together, they spent most of their time at his parent's house. His parents have supported their son with the many financial burdens that have resulted from his alcoholism, including DWI's (2007, 2013) and other legal issues. They also split expenses for their grandson, including tuition at [employer]. They are very upset and frustrated with their son. The patient's mother is often verbally abusive and demeaning towards her son and this is a source of great shame for him. When encouraged to get counseling or go to  Al-Anon, his mother just waves her checkbook and reminds him that this is all his fault. In addition to alcohol dependence, the patient is being treated for major depressive disorder and GAD. The patient has had numerous treatment experiences and those include residential treatment at [facility] along with outpatient treatments. The patient's longest period of sobriety is one year, which he attained in 2012 after living at [facility], a men's residential facility in [place], [place]. The patient was very eager to get back into the program, rebuild his accountable and get back into the routines that were helping him remain sober. The orientation was completed accordingly and the patient will return on Wednesday, December 27, and begin the CD-IOP.

## 2019-09-02 IMAGING — CR DG CHEST 2V
2 series · 2 of 2 positions shown · non-contrast
Comparison: 02/22/2013

CLINICAL DATA: Left chest pain

EXAM:
CHEST - 2 VIEW

[w chest pa]
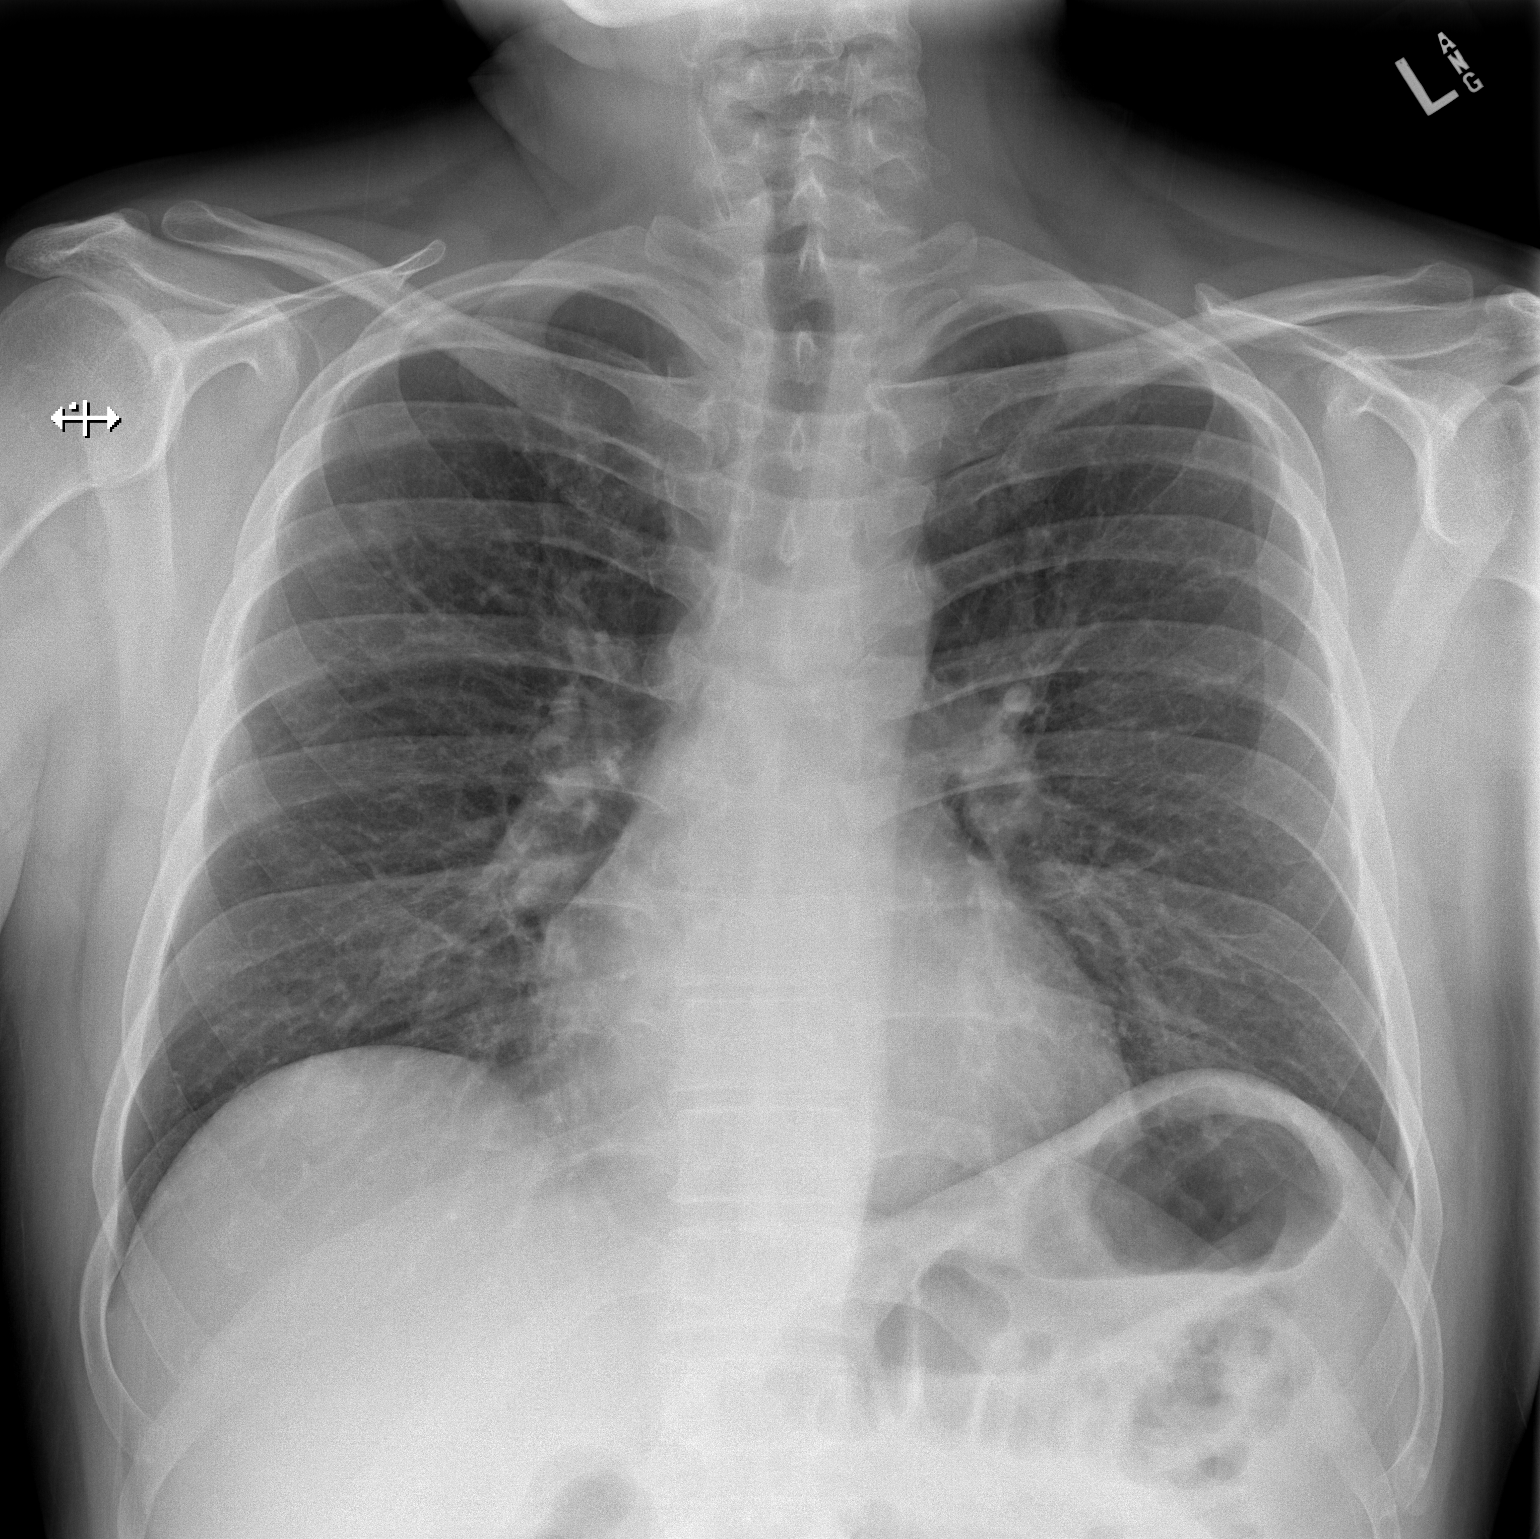

[w chest lat]
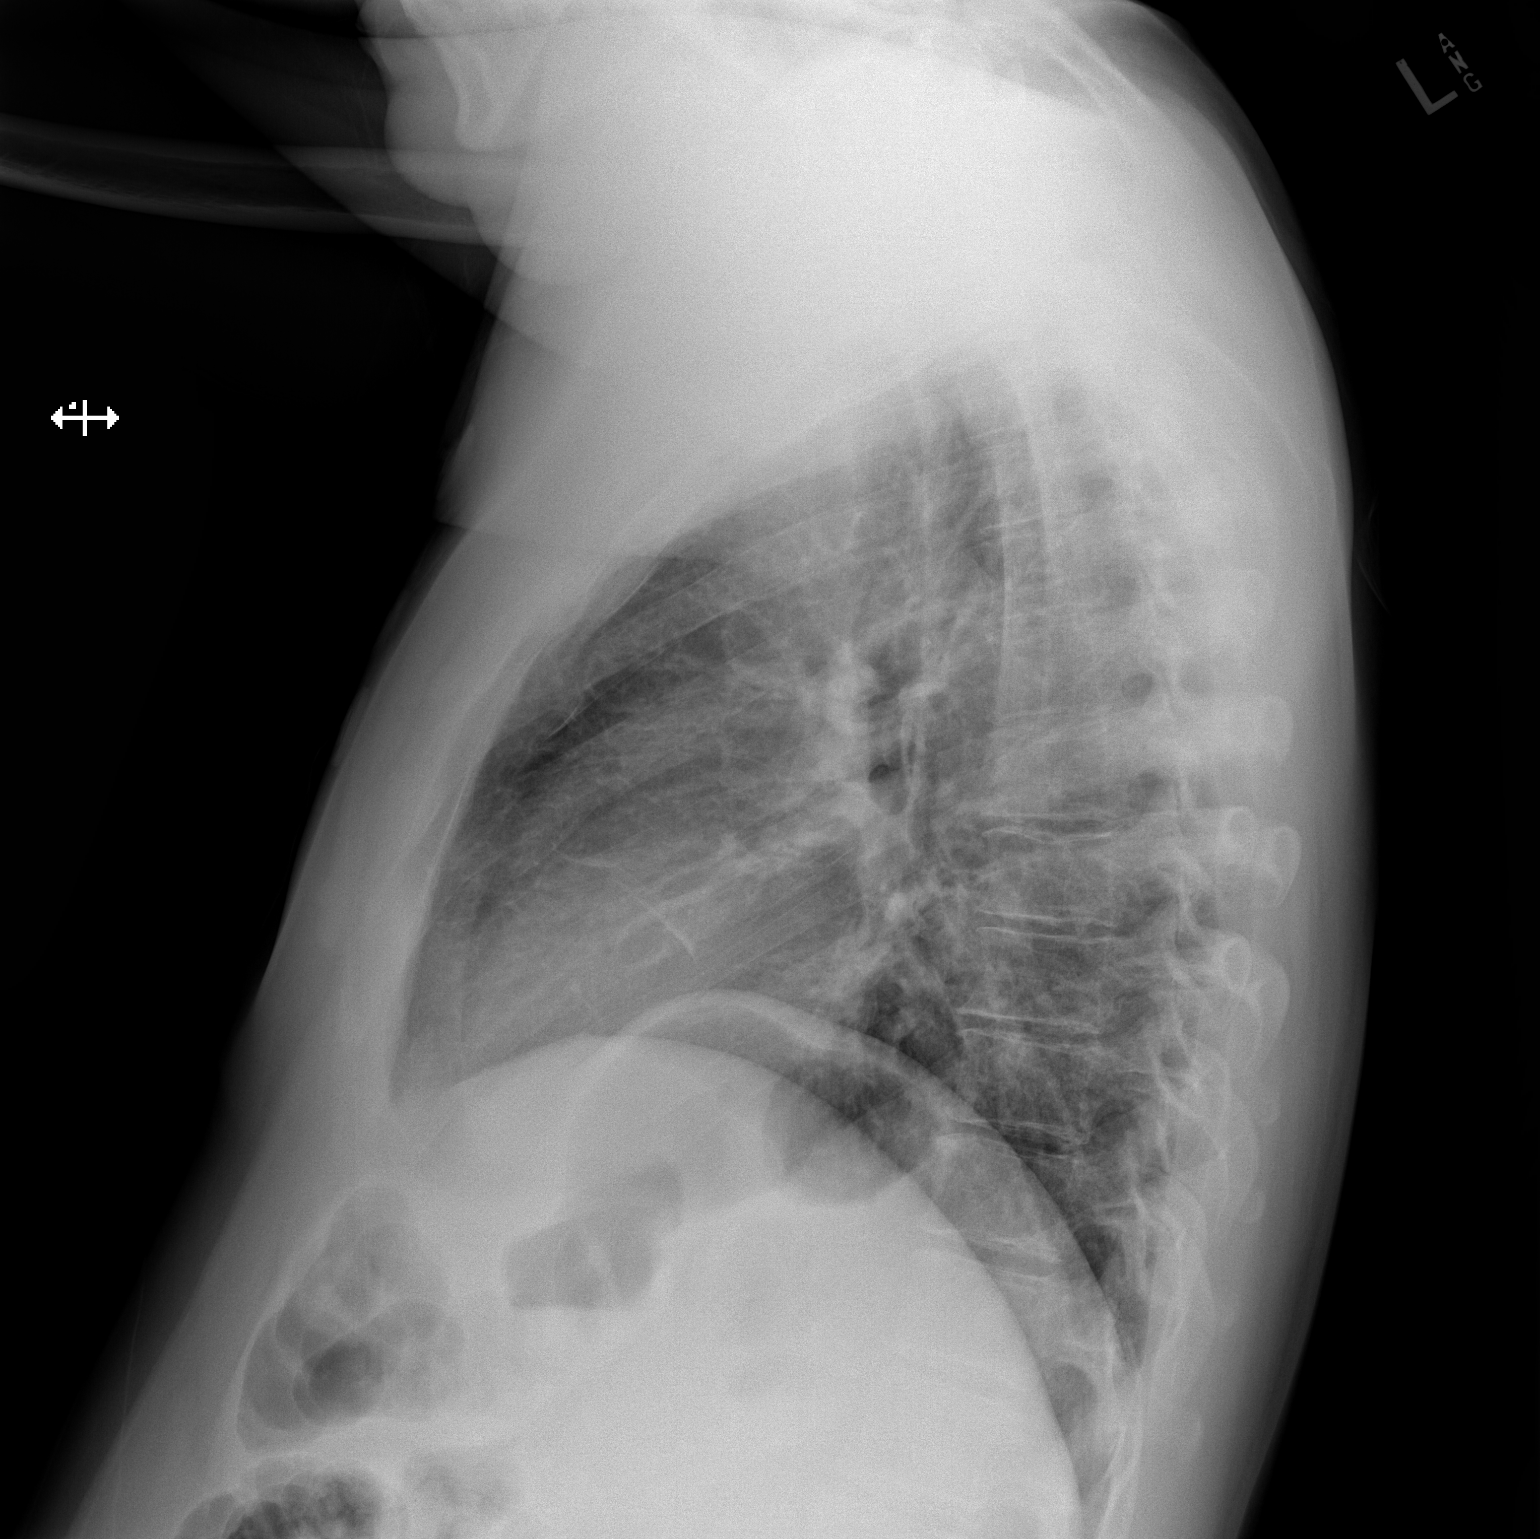

[2 of 2 positions shown; findings below may reference images not displayed]

FINDINGS: Lungs are clear.  No pleural effusion or pneumothorax.

The heart is normal in size.

Visualized osseous structures are within normal limits.
IMPRESSION: Normal chest radiographs.

## 2022-06-10 DEATH — deceased
